# Patient Record
Sex: Female | Born: 1935 | Race: White | Hispanic: No | Marital: Married | Smoking: Former smoker
Health system: Southern US, Community
[De-identification: ages and names within clinical notes are randomized; demographics above are authoritative.]

## PROBLEM LIST (undated history)

## (undated) DIAGNOSIS — M4802 Spinal stenosis, cervical region: Secondary | ICD-10-CM

## (undated) DIAGNOSIS — E785 Hyperlipidemia, unspecified: Secondary | ICD-10-CM

## (undated) DIAGNOSIS — I5189 Other ill-defined heart diseases: Secondary | ICD-10-CM

## (undated) DIAGNOSIS — K219 Gastro-esophageal reflux disease without esophagitis: Secondary | ICD-10-CM

## (undated) DIAGNOSIS — G4700 Insomnia, unspecified: Secondary | ICD-10-CM

## (undated) DIAGNOSIS — D638 Anemia in other chronic diseases classified elsewhere: Secondary | ICD-10-CM

## (undated) DIAGNOSIS — R55 Syncope and collapse: Secondary | ICD-10-CM

## (undated) DIAGNOSIS — J302 Other seasonal allergic rhinitis: Secondary | ICD-10-CM

## (undated) DIAGNOSIS — I10 Essential (primary) hypertension: Secondary | ICD-10-CM

## (undated) DIAGNOSIS — R059 Cough, unspecified: Secondary | ICD-10-CM

## (undated) DIAGNOSIS — I484 Atypical atrial flutter: Secondary | ICD-10-CM

## (undated) DIAGNOSIS — M1990 Unspecified osteoarthritis, unspecified site: Secondary | ICD-10-CM

## (undated) DIAGNOSIS — J45909 Unspecified asthma, uncomplicated: Secondary | ICD-10-CM

## (undated) DIAGNOSIS — I4891 Unspecified atrial fibrillation: Secondary | ICD-10-CM

## (undated) DIAGNOSIS — R05 Cough: Secondary | ICD-10-CM

## (undated) DIAGNOSIS — I2510 Atherosclerotic heart disease of native coronary artery without angina pectoris: Secondary | ICD-10-CM

## (undated) DIAGNOSIS — I82409 Acute embolism and thrombosis of unspecified deep veins of unspecified lower extremity: Secondary | ICD-10-CM

## (undated) DIAGNOSIS — R0989 Other specified symptoms and signs involving the circulatory and respiratory systems: Secondary | ICD-10-CM

## (undated) DIAGNOSIS — E559 Vitamin D deficiency, unspecified: Secondary | ICD-10-CM

## (undated) HISTORY — DX: Vitamin D deficiency, unspecified: E55.9

## (undated) HISTORY — DX: Unspecified asthma, uncomplicated: J45.909

## (undated) HISTORY — PX: BASAL CELL CARCINOMA EXCISION: SHX1214

## (undated) HISTORY — DX: Unspecified atrial fibrillation: I48.91

## (undated) HISTORY — DX: Other ill-defined heart diseases: I51.89

## (undated) HISTORY — DX: Syncope and collapse: R55

## (undated) HISTORY — DX: Other seasonal allergic rhinitis: J30.2

## (undated) HISTORY — DX: Insomnia, unspecified: G47.00

## (undated) HISTORY — DX: Gastro-esophageal reflux disease without esophagitis: K21.9

## (undated) HISTORY — PX: VEIN LIGATION AND STRIPPING: SHX2653

## (undated) HISTORY — PX: FOOT SURGERY: SHX648

## (undated) HISTORY — DX: Atherosclerotic heart disease of native coronary artery without angina pectoris: I25.10

## (undated) HISTORY — DX: Cough, unspecified: R05.9

## (undated) HISTORY — DX: Spinal stenosis, cervical region: M48.02

## (undated) HISTORY — DX: Anemia in other chronic diseases classified elsewhere: D63.8

## (undated) HISTORY — DX: Cough: R05

## (undated) HISTORY — PX: JOINT REPLACEMENT: SHX530

## (undated) HISTORY — DX: Other specified symptoms and signs involving the circulatory and respiratory systems: R09.89

---

## 2013-05-11 DIAGNOSIS — B351 Tinea unguium: Secondary | ICD-10-CM | POA: Diagnosis not present

## 2013-05-11 DIAGNOSIS — M79609 Pain in unspecified limb: Secondary | ICD-10-CM | POA: Diagnosis not present

## 2013-05-12 DIAGNOSIS — D649 Anemia, unspecified: Secondary | ICD-10-CM | POA: Diagnosis not present

## 2013-07-13 DIAGNOSIS — M79609 Pain in unspecified limb: Secondary | ICD-10-CM | POA: Diagnosis not present

## 2013-07-13 DIAGNOSIS — B351 Tinea unguium: Secondary | ICD-10-CM | POA: Diagnosis not present

## 2013-07-21 DIAGNOSIS — Z7982 Long term (current) use of aspirin: Secondary | ICD-10-CM | POA: Diagnosis not present

## 2013-07-21 DIAGNOSIS — Z86010 Personal history of colonic polyps: Secondary | ICD-10-CM | POA: Diagnosis not present

## 2013-07-21 DIAGNOSIS — Z800 Family history of malignant neoplasm of digestive organs: Secondary | ICD-10-CM | POA: Diagnosis not present

## 2013-07-30 DIAGNOSIS — R7989 Other specified abnormal findings of blood chemistry: Secondary | ICD-10-CM | POA: Diagnosis not present

## 2013-07-30 DIAGNOSIS — D649 Anemia, unspecified: Secondary | ICD-10-CM | POA: Diagnosis not present

## 2013-08-04 DIAGNOSIS — G609 Hereditary and idiopathic neuropathy, unspecified: Secondary | ICD-10-CM | POA: Diagnosis not present

## 2013-08-04 DIAGNOSIS — E785 Hyperlipidemia, unspecified: Secondary | ICD-10-CM | POA: Diagnosis not present

## 2013-08-04 DIAGNOSIS — I10 Essential (primary) hypertension: Secondary | ICD-10-CM | POA: Diagnosis not present

## 2013-08-04 DIAGNOSIS — Z23 Encounter for immunization: Secondary | ICD-10-CM | POA: Diagnosis not present

## 2013-08-04 DIAGNOSIS — IMO0002 Reserved for concepts with insufficient information to code with codable children: Secondary | ICD-10-CM | POA: Diagnosis not present

## 2013-08-10 DIAGNOSIS — K130 Diseases of lips: Secondary | ICD-10-CM | POA: Diagnosis not present

## 2013-08-10 DIAGNOSIS — C44319 Basal cell carcinoma of skin of other parts of face: Secondary | ICD-10-CM | POA: Diagnosis not present

## 2013-08-10 DIAGNOSIS — D485 Neoplasm of uncertain behavior of skin: Secondary | ICD-10-CM | POA: Diagnosis not present

## 2013-08-10 DIAGNOSIS — D1801 Hemangioma of skin and subcutaneous tissue: Secondary | ICD-10-CM | POA: Diagnosis not present

## 2013-08-10 DIAGNOSIS — L821 Other seborrheic keratosis: Secondary | ICD-10-CM | POA: Diagnosis not present

## 2013-08-16 DIAGNOSIS — Z1211 Encounter for screening for malignant neoplasm of colon: Secondary | ICD-10-CM | POA: Diagnosis not present

## 2013-08-16 DIAGNOSIS — Z86010 Personal history of colonic polyps: Secondary | ICD-10-CM | POA: Diagnosis not present

## 2013-08-16 DIAGNOSIS — Z800 Family history of malignant neoplasm of digestive organs: Secondary | ICD-10-CM | POA: Diagnosis not present

## 2013-09-30 DIAGNOSIS — B351 Tinea unguium: Secondary | ICD-10-CM | POA: Diagnosis not present

## 2013-09-30 DIAGNOSIS — M79609 Pain in unspecified limb: Secondary | ICD-10-CM | POA: Diagnosis not present

## 2013-10-20 DIAGNOSIS — K551 Chronic vascular disorders of intestine: Secondary | ICD-10-CM | POA: Diagnosis not present

## 2013-10-20 DIAGNOSIS — I10 Essential (primary) hypertension: Secondary | ICD-10-CM | POA: Diagnosis not present

## 2013-10-20 DIAGNOSIS — I6529 Occlusion and stenosis of unspecified carotid artery: Secondary | ICD-10-CM | POA: Diagnosis not present

## 2013-10-20 DIAGNOSIS — G458 Other transient cerebral ischemic attacks and related syndromes: Secondary | ICD-10-CM | POA: Diagnosis not present

## 2013-10-28 DIAGNOSIS — H353190 Nonexudative age-related macular degeneration, unspecified eye, stage unspecified: Secondary | ICD-10-CM | POA: Diagnosis not present

## 2013-12-14 DIAGNOSIS — R262 Difficulty in walking, not elsewhere classified: Secondary | ICD-10-CM | POA: Diagnosis not present

## 2013-12-14 DIAGNOSIS — B351 Tinea unguium: Secondary | ICD-10-CM | POA: Diagnosis not present

## 2014-01-24 DIAGNOSIS — C44319 Basal cell carcinoma of skin of other parts of face: Secondary | ICD-10-CM | POA: Diagnosis not present

## 2014-02-08 DIAGNOSIS — I10 Essential (primary) hypertension: Secondary | ICD-10-CM | POA: Diagnosis not present

## 2014-02-08 DIAGNOSIS — N399 Disorder of urinary system, unspecified: Secondary | ICD-10-CM | POA: Diagnosis not present

## 2014-02-08 DIAGNOSIS — E559 Vitamin D deficiency, unspecified: Secondary | ICD-10-CM | POA: Diagnosis not present

## 2014-02-09 DIAGNOSIS — I6529 Occlusion and stenosis of unspecified carotid artery: Secondary | ICD-10-CM | POA: Diagnosis not present

## 2014-02-11 DIAGNOSIS — Z0001 Encounter for general adult medical examination with abnormal findings: Secondary | ICD-10-CM | POA: Diagnosis not present

## 2014-02-11 DIAGNOSIS — Z13820 Encounter for screening for osteoporosis: Secondary | ICD-10-CM | POA: Diagnosis not present

## 2014-02-11 DIAGNOSIS — I10 Essential (primary) hypertension: Secondary | ICD-10-CM | POA: Diagnosis not present

## 2014-02-11 DIAGNOSIS — D538 Other specified nutritional anemias: Secondary | ICD-10-CM | POA: Diagnosis not present

## 2014-02-11 DIAGNOSIS — Z1231 Encounter for screening mammogram for malignant neoplasm of breast: Secondary | ICD-10-CM | POA: Diagnosis not present

## 2014-02-11 DIAGNOSIS — J4520 Mild intermittent asthma, uncomplicated: Secondary | ICD-10-CM | POA: Diagnosis not present

## 2014-02-11 DIAGNOSIS — E780 Pure hypercholesterolemia: Secondary | ICD-10-CM | POA: Diagnosis not present

## 2014-02-11 DIAGNOSIS — J209 Acute bronchitis, unspecified: Secondary | ICD-10-CM | POA: Diagnosis not present

## 2014-02-15 DIAGNOSIS — B351 Tinea unguium: Secondary | ICD-10-CM | POA: Diagnosis not present

## 2014-02-15 DIAGNOSIS — R262 Difficulty in walking, not elsewhere classified: Secondary | ICD-10-CM | POA: Diagnosis not present

## 2014-03-03 DIAGNOSIS — R05 Cough: Secondary | ICD-10-CM | POA: Diagnosis not present

## 2014-03-03 DIAGNOSIS — R938 Abnormal findings on diagnostic imaging of other specified body structures: Secondary | ICD-10-CM | POA: Diagnosis not present

## 2014-03-15 DIAGNOSIS — J3089 Other allergic rhinitis: Secondary | ICD-10-CM | POA: Diagnosis not present

## 2014-03-15 DIAGNOSIS — R05 Cough: Secondary | ICD-10-CM | POA: Diagnosis not present

## 2014-03-30 DIAGNOSIS — Z1231 Encounter for screening mammogram for malignant neoplasm of breast: Secondary | ICD-10-CM | POA: Diagnosis not present

## 2014-03-30 DIAGNOSIS — M899 Disorder of bone, unspecified: Secondary | ICD-10-CM | POA: Diagnosis not present

## 2014-04-04 DIAGNOSIS — Z23 Encounter for immunization: Secondary | ICD-10-CM | POA: Diagnosis not present

## 2014-04-11 DIAGNOSIS — H43813 Vitreous degeneration, bilateral: Secondary | ICD-10-CM | POA: Diagnosis not present

## 2014-04-11 DIAGNOSIS — H35361 Drusen (degenerative) of macula, right eye: Secondary | ICD-10-CM | POA: Diagnosis not present

## 2014-04-11 DIAGNOSIS — H04123 Dry eye syndrome of bilateral lacrimal glands: Secondary | ICD-10-CM | POA: Diagnosis not present

## 2014-04-11 DIAGNOSIS — H2513 Age-related nuclear cataract, bilateral: Secondary | ICD-10-CM | POA: Diagnosis not present

## 2014-04-19 DIAGNOSIS — B351 Tinea unguium: Secondary | ICD-10-CM | POA: Diagnosis not present

## 2014-04-19 DIAGNOSIS — R262 Difficulty in walking, not elsewhere classified: Secondary | ICD-10-CM | POA: Diagnosis not present

## 2014-06-21 DIAGNOSIS — R262 Difficulty in walking, not elsewhere classified: Secondary | ICD-10-CM | POA: Diagnosis not present

## 2014-06-21 DIAGNOSIS — B351 Tinea unguium: Secondary | ICD-10-CM | POA: Diagnosis not present

## 2014-06-29 DIAGNOSIS — J45998 Other asthma: Secondary | ICD-10-CM | POA: Diagnosis not present

## 2014-08-01 DIAGNOSIS — D1801 Hemangioma of skin and subcutaneous tissue: Secondary | ICD-10-CM | POA: Diagnosis not present

## 2014-08-01 DIAGNOSIS — L905 Scar conditions and fibrosis of skin: Secondary | ICD-10-CM | POA: Diagnosis not present

## 2014-08-01 DIAGNOSIS — L821 Other seborrheic keratosis: Secondary | ICD-10-CM | POA: Diagnosis not present

## 2014-08-19 DIAGNOSIS — Z6822 Body mass index (BMI) 22.0-22.9, adult: Secondary | ICD-10-CM | POA: Diagnosis not present

## 2014-08-19 DIAGNOSIS — F418 Other specified anxiety disorders: Secondary | ICD-10-CM | POA: Diagnosis not present

## 2014-08-23 DIAGNOSIS — B351 Tinea unguium: Secondary | ICD-10-CM | POA: Diagnosis not present

## 2014-08-23 DIAGNOSIS — R262 Difficulty in walking, not elsewhere classified: Secondary | ICD-10-CM | POA: Diagnosis not present

## 2015-02-13 DIAGNOSIS — K635 Polyp of colon: Secondary | ICD-10-CM | POA: Insufficient documentation

## 2015-02-13 DIAGNOSIS — E559 Vitamin D deficiency, unspecified: Secondary | ICD-10-CM | POA: Diagnosis not present

## 2015-02-13 DIAGNOSIS — Z23 Encounter for immunization: Secondary | ICD-10-CM | POA: Diagnosis not present

## 2015-02-13 DIAGNOSIS — Z1389 Encounter for screening for other disorder: Secondary | ICD-10-CM | POA: Diagnosis not present

## 2015-02-13 DIAGNOSIS — E785 Hyperlipidemia, unspecified: Secondary | ICD-10-CM | POA: Insufficient documentation

## 2015-02-13 DIAGNOSIS — J45909 Unspecified asthma, uncomplicated: Secondary | ICD-10-CM | POA: Diagnosis not present

## 2015-02-13 DIAGNOSIS — G4700 Insomnia, unspecified: Secondary | ICD-10-CM | POA: Insufficient documentation

## 2015-02-13 DIAGNOSIS — I10 Essential (primary) hypertension: Secondary | ICD-10-CM | POA: Diagnosis not present

## 2015-02-13 DIAGNOSIS — G5601 Carpal tunnel syndrome, right upper limb: Secondary | ICD-10-CM | POA: Diagnosis not present

## 2015-04-07 DIAGNOSIS — R8299 Other abnormal findings in urine: Secondary | ICD-10-CM | POA: Diagnosis not present

## 2015-04-07 DIAGNOSIS — E784 Other hyperlipidemia: Secondary | ICD-10-CM | POA: Diagnosis not present

## 2015-04-07 DIAGNOSIS — I10 Essential (primary) hypertension: Secondary | ICD-10-CM | POA: Diagnosis not present

## 2015-04-07 DIAGNOSIS — E559 Vitamin D deficiency, unspecified: Secondary | ICD-10-CM | POA: Diagnosis not present

## 2015-04-12 DIAGNOSIS — Z0000 Encounter for general adult medical examination without abnormal findings: Secondary | ICD-10-CM | POA: Diagnosis not present

## 2015-04-12 DIAGNOSIS — D649 Anemia, unspecified: Secondary | ICD-10-CM | POA: Diagnosis not present

## 2015-04-20 DIAGNOSIS — I6529 Occlusion and stenosis of unspecified carotid artery: Secondary | ICD-10-CM | POA: Insufficient documentation

## 2015-04-20 DIAGNOSIS — K219 Gastro-esophageal reflux disease without esophagitis: Secondary | ICD-10-CM | POA: Diagnosis not present

## 2015-04-20 DIAGNOSIS — I2510 Atherosclerotic heart disease of native coronary artery without angina pectoris: Secondary | ICD-10-CM | POA: Insufficient documentation

## 2015-04-20 DIAGNOSIS — H3530 Unspecified macular degeneration: Secondary | ICD-10-CM | POA: Insufficient documentation

## 2015-04-20 DIAGNOSIS — I519 Heart disease, unspecified: Secondary | ICD-10-CM | POA: Diagnosis not present

## 2015-04-20 DIAGNOSIS — G4709 Other insomnia: Secondary | ICD-10-CM | POA: Diagnosis not present

## 2015-04-20 DIAGNOSIS — D649 Anemia, unspecified: Secondary | ICD-10-CM | POA: Insufficient documentation

## 2015-04-20 DIAGNOSIS — I10 Essential (primary) hypertension: Secondary | ICD-10-CM | POA: Diagnosis not present

## 2015-04-20 DIAGNOSIS — Z6823 Body mass index (BMI) 23.0-23.9, adult: Secondary | ICD-10-CM | POA: Diagnosis not present

## 2015-04-20 DIAGNOSIS — Z0000 Encounter for general adult medical examination without abnormal findings: Secondary | ICD-10-CM | POA: Diagnosis not present

## 2015-04-20 DIAGNOSIS — D638 Anemia in other chronic diseases classified elsewhere: Secondary | ICD-10-CM | POA: Diagnosis not present

## 2015-04-20 DIAGNOSIS — M4802 Spinal stenosis, cervical region: Secondary | ICD-10-CM | POA: Diagnosis not present

## 2015-04-20 DIAGNOSIS — K635 Polyp of colon: Secondary | ICD-10-CM | POA: Diagnosis not present

## 2015-04-20 DIAGNOSIS — J309 Allergic rhinitis, unspecified: Secondary | ICD-10-CM | POA: Insufficient documentation

## 2015-04-20 DIAGNOSIS — E784 Other hyperlipidemia: Secondary | ICD-10-CM | POA: Diagnosis not present

## 2015-04-24 ENCOUNTER — Other Ambulatory Visit: Payer: Self-pay | Admitting: Internal Medicine

## 2015-04-24 DIAGNOSIS — Z1231 Encounter for screening mammogram for malignant neoplasm of breast: Secondary | ICD-10-CM

## 2015-05-17 DIAGNOSIS — I771 Stricture of artery: Secondary | ICD-10-CM | POA: Diagnosis not present

## 2015-05-17 DIAGNOSIS — I739 Peripheral vascular disease, unspecified: Secondary | ICD-10-CM | POA: Diagnosis not present

## 2015-05-17 DIAGNOSIS — R079 Chest pain, unspecified: Secondary | ICD-10-CM | POA: Diagnosis not present

## 2015-05-17 DIAGNOSIS — I6523 Occlusion and stenosis of bilateral carotid arteries: Secondary | ICD-10-CM | POA: Diagnosis not present

## 2015-05-22 DIAGNOSIS — R0789 Other chest pain: Secondary | ICD-10-CM | POA: Diagnosis not present

## 2015-05-24 DIAGNOSIS — I739 Peripheral vascular disease, unspecified: Secondary | ICD-10-CM | POA: Diagnosis not present

## 2015-05-24 DIAGNOSIS — R0989 Other specified symptoms and signs involving the circulatory and respiratory systems: Secondary | ICD-10-CM | POA: Diagnosis not present

## 2015-05-30 DIAGNOSIS — I6523 Occlusion and stenosis of bilateral carotid arteries: Secondary | ICD-10-CM | POA: Diagnosis not present

## 2015-05-30 DIAGNOSIS — I080 Rheumatic disorders of both mitral and aortic valves: Secondary | ICD-10-CM | POA: Diagnosis not present

## 2015-05-30 DIAGNOSIS — I771 Stricture of artery: Secondary | ICD-10-CM | POA: Diagnosis not present

## 2015-06-05 ENCOUNTER — Ambulatory Visit
Admission: RE | Admit: 2015-06-05 | Discharge: 2015-06-05 | Disposition: A | Discharge status: Home / Self Care | Payer: Medicare HMO | Source: Ambulatory Visit | Attending: Internal Medicine | Admitting: Internal Medicine

## 2015-06-05 ENCOUNTER — Ambulatory Visit: Payer: Self-pay

## 2015-06-05 DIAGNOSIS — Z1231 Encounter for screening mammogram for malignant neoplasm of breast: Secondary | ICD-10-CM

## 2015-06-21 DIAGNOSIS — I771 Stricture of artery: Secondary | ICD-10-CM | POA: Diagnosis not present

## 2015-06-28 DIAGNOSIS — R079 Chest pain, unspecified: Secondary | ICD-10-CM | POA: Diagnosis not present

## 2015-06-28 DIAGNOSIS — I771 Stricture of artery: Secondary | ICD-10-CM | POA: Diagnosis not present

## 2015-06-28 DIAGNOSIS — I739 Peripheral vascular disease, unspecified: Secondary | ICD-10-CM | POA: Diagnosis not present

## 2015-06-28 DIAGNOSIS — I080 Rheumatic disorders of both mitral and aortic valves: Secondary | ICD-10-CM | POA: Diagnosis not present

## 2015-08-23 DIAGNOSIS — H5211 Myopia, right eye: Secondary | ICD-10-CM | POA: Diagnosis not present

## 2015-08-23 DIAGNOSIS — H353131 Nonexudative age-related macular degeneration, bilateral, early dry stage: Secondary | ICD-10-CM | POA: Diagnosis not present

## 2015-08-23 DIAGNOSIS — H52222 Regular astigmatism, left eye: Secondary | ICD-10-CM | POA: Diagnosis not present

## 2015-08-23 DIAGNOSIS — H25813 Combined forms of age-related cataract, bilateral: Secondary | ICD-10-CM | POA: Diagnosis not present

## 2015-08-23 DIAGNOSIS — H04123 Dry eye syndrome of bilateral lacrimal glands: Secondary | ICD-10-CM | POA: Diagnosis not present

## 2015-08-23 DIAGNOSIS — H524 Presbyopia: Secondary | ICD-10-CM | POA: Diagnosis not present

## 2015-10-30 DIAGNOSIS — I10 Essential (primary) hypertension: Secondary | ICD-10-CM | POA: Diagnosis not present

## 2015-10-30 DIAGNOSIS — Z1389 Encounter for screening for other disorder: Secondary | ICD-10-CM | POA: Diagnosis not present

## 2015-10-30 DIAGNOSIS — I519 Heart disease, unspecified: Secondary | ICD-10-CM | POA: Diagnosis not present

## 2015-10-30 DIAGNOSIS — I2510 Atherosclerotic heart disease of native coronary artery without angina pectoris: Secondary | ICD-10-CM | POA: Diagnosis not present

## 2015-10-30 DIAGNOSIS — I6529 Occlusion and stenosis of unspecified carotid artery: Secondary | ICD-10-CM | POA: Diagnosis not present

## 2015-10-30 DIAGNOSIS — E784 Other hyperlipidemia: Secondary | ICD-10-CM | POA: Diagnosis not present

## 2015-10-30 DIAGNOSIS — Z6823 Body mass index (BMI) 23.0-23.9, adult: Secondary | ICD-10-CM | POA: Diagnosis not present

## 2015-10-30 DIAGNOSIS — G5601 Carpal tunnel syndrome, right upper limb: Secondary | ICD-10-CM | POA: Diagnosis not present

## 2015-11-10 DIAGNOSIS — M47817 Spondylosis without myelopathy or radiculopathy, lumbosacral region: Secondary | ICD-10-CM | POA: Diagnosis not present

## 2015-11-10 DIAGNOSIS — M5136 Other intervertebral disc degeneration, lumbar region: Secondary | ICD-10-CM | POA: Diagnosis not present

## 2015-11-10 DIAGNOSIS — Z6823 Body mass index (BMI) 23.0-23.9, adult: Secondary | ICD-10-CM | POA: Diagnosis not present

## 2015-11-10 DIAGNOSIS — M47816 Spondylosis without myelopathy or radiculopathy, lumbar region: Secondary | ICD-10-CM | POA: Diagnosis not present

## 2015-11-10 DIAGNOSIS — M545 Low back pain: Secondary | ICD-10-CM | POA: Diagnosis not present

## 2015-11-10 DIAGNOSIS — M5137 Other intervertebral disc degeneration, lumbosacral region: Secondary | ICD-10-CM | POA: Diagnosis not present

## 2015-11-10 DIAGNOSIS — M25552 Pain in left hip: Secondary | ICD-10-CM | POA: Diagnosis not present

## 2015-11-13 DIAGNOSIS — Z1283 Encounter for screening for malignant neoplasm of skin: Secondary | ICD-10-CM | POA: Diagnosis not present

## 2015-11-13 DIAGNOSIS — L908 Other atrophic disorders of skin: Secondary | ICD-10-CM | POA: Diagnosis not present

## 2015-11-13 DIAGNOSIS — L821 Other seborrheic keratosis: Secondary | ICD-10-CM | POA: Diagnosis not present

## 2015-11-21 DIAGNOSIS — I714 Abdominal aortic aneurysm, without rupture: Secondary | ICD-10-CM | POA: Diagnosis not present

## 2015-12-12 DIAGNOSIS — M79641 Pain in right hand: Secondary | ICD-10-CM | POA: Diagnosis not present

## 2015-12-12 DIAGNOSIS — G5601 Carpal tunnel syndrome, right upper limb: Secondary | ICD-10-CM | POA: Diagnosis not present

## 2016-01-20 DIAGNOSIS — M79641 Pain in right hand: Secondary | ICD-10-CM | POA: Diagnosis not present

## 2016-02-07 DIAGNOSIS — R69 Illness, unspecified: Secondary | ICD-10-CM | POA: Diagnosis not present

## 2016-02-08 DIAGNOSIS — G5601 Carpal tunnel syndrome, right upper limb: Secondary | ICD-10-CM | POA: Diagnosis not present

## 2016-02-08 DIAGNOSIS — M79641 Pain in right hand: Secondary | ICD-10-CM | POA: Diagnosis not present

## 2016-03-11 DIAGNOSIS — M47817 Spondylosis without myelopathy or radiculopathy, lumbosacral region: Secondary | ICD-10-CM | POA: Diagnosis not present

## 2016-03-11 DIAGNOSIS — M9902 Segmental and somatic dysfunction of thoracic region: Secondary | ICD-10-CM | POA: Diagnosis not present

## 2016-03-11 DIAGNOSIS — S29012A Strain of muscle and tendon of back wall of thorax, initial encounter: Secondary | ICD-10-CM | POA: Diagnosis not present

## 2016-03-11 DIAGNOSIS — M5137 Other intervertebral disc degeneration, lumbosacral region: Secondary | ICD-10-CM | POA: Diagnosis not present

## 2016-03-11 DIAGNOSIS — M9903 Segmental and somatic dysfunction of lumbar region: Secondary | ICD-10-CM | POA: Diagnosis not present

## 2016-03-11 DIAGNOSIS — M9905 Segmental and somatic dysfunction of pelvic region: Secondary | ICD-10-CM | POA: Diagnosis not present

## 2016-03-18 DIAGNOSIS — M5137 Other intervertebral disc degeneration, lumbosacral region: Secondary | ICD-10-CM | POA: Diagnosis not present

## 2016-03-18 DIAGNOSIS — S29012A Strain of muscle and tendon of back wall of thorax, initial encounter: Secondary | ICD-10-CM | POA: Diagnosis not present

## 2016-03-18 DIAGNOSIS — M9905 Segmental and somatic dysfunction of pelvic region: Secondary | ICD-10-CM | POA: Diagnosis not present

## 2016-03-18 DIAGNOSIS — M9902 Segmental and somatic dysfunction of thoracic region: Secondary | ICD-10-CM | POA: Diagnosis not present

## 2016-03-18 DIAGNOSIS — M9903 Segmental and somatic dysfunction of lumbar region: Secondary | ICD-10-CM | POA: Diagnosis not present

## 2016-03-18 DIAGNOSIS — M47817 Spondylosis without myelopathy or radiculopathy, lumbosacral region: Secondary | ICD-10-CM | POA: Diagnosis not present

## 2016-03-25 DIAGNOSIS — M9905 Segmental and somatic dysfunction of pelvic region: Secondary | ICD-10-CM | POA: Diagnosis not present

## 2016-03-25 DIAGNOSIS — M9902 Segmental and somatic dysfunction of thoracic region: Secondary | ICD-10-CM | POA: Diagnosis not present

## 2016-03-25 DIAGNOSIS — M47817 Spondylosis without myelopathy or radiculopathy, lumbosacral region: Secondary | ICD-10-CM | POA: Diagnosis not present

## 2016-03-25 DIAGNOSIS — M5137 Other intervertebral disc degeneration, lumbosacral region: Secondary | ICD-10-CM | POA: Diagnosis not present

## 2016-03-25 DIAGNOSIS — S29012A Strain of muscle and tendon of back wall of thorax, initial encounter: Secondary | ICD-10-CM | POA: Diagnosis not present

## 2016-03-25 DIAGNOSIS — M9903 Segmental and somatic dysfunction of lumbar region: Secondary | ICD-10-CM | POA: Diagnosis not present

## 2016-04-01 DIAGNOSIS — J45909 Unspecified asthma, uncomplicated: Secondary | ICD-10-CM | POA: Diagnosis not present

## 2016-04-01 DIAGNOSIS — S29012A Strain of muscle and tendon of back wall of thorax, initial encounter: Secondary | ICD-10-CM | POA: Diagnosis not present

## 2016-04-01 DIAGNOSIS — Z6823 Body mass index (BMI) 23.0-23.9, adult: Secondary | ICD-10-CM | POA: Diagnosis not present

## 2016-04-01 DIAGNOSIS — R05 Cough: Secondary | ICD-10-CM | POA: Diagnosis not present

## 2016-04-01 DIAGNOSIS — M9902 Segmental and somatic dysfunction of thoracic region: Secondary | ICD-10-CM | POA: Diagnosis not present

## 2016-04-01 DIAGNOSIS — M47817 Spondylosis without myelopathy or radiculopathy, lumbosacral region: Secondary | ICD-10-CM | POA: Diagnosis not present

## 2016-04-01 DIAGNOSIS — J309 Allergic rhinitis, unspecified: Secondary | ICD-10-CM | POA: Diagnosis not present

## 2016-04-01 DIAGNOSIS — M5137 Other intervertebral disc degeneration, lumbosacral region: Secondary | ICD-10-CM | POA: Diagnosis not present

## 2016-04-01 DIAGNOSIS — M9905 Segmental and somatic dysfunction of pelvic region: Secondary | ICD-10-CM | POA: Diagnosis not present

## 2016-04-01 DIAGNOSIS — M9903 Segmental and somatic dysfunction of lumbar region: Secondary | ICD-10-CM | POA: Diagnosis not present

## 2016-04-15 DIAGNOSIS — M9905 Segmental and somatic dysfunction of pelvic region: Secondary | ICD-10-CM | POA: Diagnosis not present

## 2016-04-15 DIAGNOSIS — M47817 Spondylosis without myelopathy or radiculopathy, lumbosacral region: Secondary | ICD-10-CM | POA: Diagnosis not present

## 2016-04-15 DIAGNOSIS — M5137 Other intervertebral disc degeneration, lumbosacral region: Secondary | ICD-10-CM | POA: Diagnosis not present

## 2016-04-15 DIAGNOSIS — S29012A Strain of muscle and tendon of back wall of thorax, initial encounter: Secondary | ICD-10-CM | POA: Diagnosis not present

## 2016-04-15 DIAGNOSIS — M9903 Segmental and somatic dysfunction of lumbar region: Secondary | ICD-10-CM | POA: Diagnosis not present

## 2016-04-15 DIAGNOSIS — M9902 Segmental and somatic dysfunction of thoracic region: Secondary | ICD-10-CM | POA: Diagnosis not present

## 2016-04-18 DIAGNOSIS — R8299 Other abnormal findings in urine: Secondary | ICD-10-CM | POA: Diagnosis not present

## 2016-04-18 DIAGNOSIS — I10 Essential (primary) hypertension: Secondary | ICD-10-CM | POA: Diagnosis not present

## 2016-04-18 DIAGNOSIS — E559 Vitamin D deficiency, unspecified: Secondary | ICD-10-CM | POA: Diagnosis not present

## 2016-04-18 DIAGNOSIS — E785 Hyperlipidemia, unspecified: Secondary | ICD-10-CM | POA: Diagnosis not present

## 2016-04-25 DIAGNOSIS — I10 Essential (primary) hypertension: Secondary | ICD-10-CM | POA: Diagnosis not present

## 2016-04-25 DIAGNOSIS — K219 Gastro-esophageal reflux disease without esophagitis: Secondary | ICD-10-CM | POA: Diagnosis not present

## 2016-04-25 DIAGNOSIS — H3530 Unspecified macular degeneration: Secondary | ICD-10-CM | POA: Diagnosis not present

## 2016-04-25 DIAGNOSIS — E784 Other hyperlipidemia: Secondary | ICD-10-CM | POA: Diagnosis not present

## 2016-04-25 DIAGNOSIS — J45909 Unspecified asthma, uncomplicated: Secondary | ICD-10-CM | POA: Diagnosis not present

## 2016-04-25 DIAGNOSIS — J309 Allergic rhinitis, unspecified: Secondary | ICD-10-CM | POA: Diagnosis not present

## 2016-04-25 DIAGNOSIS — I519 Heart disease, unspecified: Secondary | ICD-10-CM | POA: Diagnosis not present

## 2016-04-25 DIAGNOSIS — Z0000 Encounter for general adult medical examination without abnormal findings: Secondary | ICD-10-CM | POA: Diagnosis not present

## 2016-04-25 DIAGNOSIS — I2510 Atherosclerotic heart disease of native coronary artery without angina pectoris: Secondary | ICD-10-CM | POA: Diagnosis not present

## 2016-04-25 DIAGNOSIS — I6529 Occlusion and stenosis of unspecified carotid artery: Secondary | ICD-10-CM | POA: Diagnosis not present

## 2016-04-25 DIAGNOSIS — M19079 Primary osteoarthritis, unspecified ankle and foot: Secondary | ICD-10-CM | POA: Insufficient documentation

## 2016-05-06 DIAGNOSIS — I4891 Unspecified atrial fibrillation: Secondary | ICD-10-CM

## 2016-05-06 HISTORY — DX: Unspecified atrial fibrillation: I48.91

## 2016-05-07 DIAGNOSIS — S29012A Strain of muscle and tendon of back wall of thorax, initial encounter: Secondary | ICD-10-CM | POA: Diagnosis not present

## 2016-05-07 DIAGNOSIS — M5137 Other intervertebral disc degeneration, lumbosacral region: Secondary | ICD-10-CM | POA: Diagnosis not present

## 2016-05-07 DIAGNOSIS — M9905 Segmental and somatic dysfunction of pelvic region: Secondary | ICD-10-CM | POA: Diagnosis not present

## 2016-05-07 DIAGNOSIS — M9903 Segmental and somatic dysfunction of lumbar region: Secondary | ICD-10-CM | POA: Diagnosis not present

## 2016-05-07 DIAGNOSIS — M9902 Segmental and somatic dysfunction of thoracic region: Secondary | ICD-10-CM | POA: Diagnosis not present

## 2016-05-07 DIAGNOSIS — M47817 Spondylosis without myelopathy or radiculopathy, lumbosacral region: Secondary | ICD-10-CM | POA: Diagnosis not present

## 2016-05-10 ENCOUNTER — Other Ambulatory Visit: Payer: Self-pay | Admitting: Internal Medicine

## 2016-05-10 DIAGNOSIS — Z1231 Encounter for screening mammogram for malignant neoplasm of breast: Secondary | ICD-10-CM

## 2016-05-28 DIAGNOSIS — M47817 Spondylosis without myelopathy or radiculopathy, lumbosacral region: Secondary | ICD-10-CM | POA: Diagnosis not present

## 2016-05-28 DIAGNOSIS — M9902 Segmental and somatic dysfunction of thoracic region: Secondary | ICD-10-CM | POA: Diagnosis not present

## 2016-05-28 DIAGNOSIS — M9903 Segmental and somatic dysfunction of lumbar region: Secondary | ICD-10-CM | POA: Diagnosis not present

## 2016-05-28 DIAGNOSIS — S29012A Strain of muscle and tendon of back wall of thorax, initial encounter: Secondary | ICD-10-CM | POA: Diagnosis not present

## 2016-05-28 DIAGNOSIS — M5137 Other intervertebral disc degeneration, lumbosacral region: Secondary | ICD-10-CM | POA: Diagnosis not present

## 2016-05-28 DIAGNOSIS — M9905 Segmental and somatic dysfunction of pelvic region: Secondary | ICD-10-CM | POA: Diagnosis not present

## 2016-06-03 DIAGNOSIS — Z20828 Contact with and (suspected) exposure to other viral communicable diseases: Secondary | ICD-10-CM | POA: Diagnosis not present

## 2016-06-03 DIAGNOSIS — R5381 Other malaise: Secondary | ICD-10-CM | POA: Diagnosis not present

## 2016-06-03 DIAGNOSIS — J209 Acute bronchitis, unspecified: Secondary | ICD-10-CM | POA: Diagnosis not present

## 2016-06-03 DIAGNOSIS — R05 Cough: Secondary | ICD-10-CM | POA: Diagnosis not present

## 2016-06-06 ENCOUNTER — Ambulatory Visit: Payer: Medicare HMO

## 2016-06-20 DIAGNOSIS — H25813 Combined forms of age-related cataract, bilateral: Secondary | ICD-10-CM | POA: Diagnosis not present

## 2016-06-20 DIAGNOSIS — H04123 Dry eye syndrome of bilateral lacrimal glands: Secondary | ICD-10-CM | POA: Diagnosis not present

## 2016-06-20 DIAGNOSIS — H353131 Nonexudative age-related macular degeneration, bilateral, early dry stage: Secondary | ICD-10-CM | POA: Diagnosis not present

## 2016-06-24 DIAGNOSIS — M205X2 Other deformities of toe(s) (acquired), left foot: Secondary | ICD-10-CM | POA: Diagnosis not present

## 2016-06-24 DIAGNOSIS — M205X1 Other deformities of toe(s) (acquired), right foot: Secondary | ICD-10-CM | POA: Diagnosis not present

## 2016-06-25 DIAGNOSIS — M9903 Segmental and somatic dysfunction of lumbar region: Secondary | ICD-10-CM | POA: Diagnosis not present

## 2016-06-25 DIAGNOSIS — M47817 Spondylosis without myelopathy or radiculopathy, lumbosacral region: Secondary | ICD-10-CM | POA: Diagnosis not present

## 2016-06-25 DIAGNOSIS — M9905 Segmental and somatic dysfunction of pelvic region: Secondary | ICD-10-CM | POA: Diagnosis not present

## 2016-06-25 DIAGNOSIS — S29012A Strain of muscle and tendon of back wall of thorax, initial encounter: Secondary | ICD-10-CM | POA: Diagnosis not present

## 2016-06-25 DIAGNOSIS — M9902 Segmental and somatic dysfunction of thoracic region: Secondary | ICD-10-CM | POA: Diagnosis not present

## 2016-06-25 DIAGNOSIS — M5137 Other intervertebral disc degeneration, lumbosacral region: Secondary | ICD-10-CM | POA: Diagnosis not present

## 2016-06-26 ENCOUNTER — Other Ambulatory Visit: Payer: Self-pay | Admitting: Orthopedic Surgery

## 2016-07-03 ENCOUNTER — Ambulatory Visit
Admission: RE | Admit: 2016-07-03 | Discharge: 2016-07-03 | Disposition: A | Discharge status: Home / Self Care | Payer: Medicare HMO | Source: Ambulatory Visit | Attending: Internal Medicine | Admitting: Internal Medicine

## 2016-07-03 ENCOUNTER — Encounter: Payer: Self-pay | Admitting: Radiology

## 2016-07-03 DIAGNOSIS — Z1231 Encounter for screening mammogram for malignant neoplasm of breast: Secondary | ICD-10-CM

## 2016-07-18 DIAGNOSIS — I771 Stricture of artery: Secondary | ICD-10-CM | POA: Diagnosis not present

## 2016-07-22 DIAGNOSIS — S29012A Strain of muscle and tendon of back wall of thorax, initial encounter: Secondary | ICD-10-CM | POA: Diagnosis not present

## 2016-07-22 DIAGNOSIS — M47817 Spondylosis without myelopathy or radiculopathy, lumbosacral region: Secondary | ICD-10-CM | POA: Diagnosis not present

## 2016-07-22 DIAGNOSIS — M9903 Segmental and somatic dysfunction of lumbar region: Secondary | ICD-10-CM | POA: Diagnosis not present

## 2016-07-22 DIAGNOSIS — M9905 Segmental and somatic dysfunction of pelvic region: Secondary | ICD-10-CM | POA: Diagnosis not present

## 2016-07-22 DIAGNOSIS — M9902 Segmental and somatic dysfunction of thoracic region: Secondary | ICD-10-CM | POA: Diagnosis not present

## 2016-07-22 DIAGNOSIS — M5137 Other intervertebral disc degeneration, lumbosacral region: Secondary | ICD-10-CM | POA: Diagnosis not present

## 2016-07-23 ENCOUNTER — Encounter (HOSPITAL_BASED_OUTPATIENT_CLINIC_OR_DEPARTMENT_OTHER): Payer: Self-pay | Admitting: *Deleted

## 2016-07-24 ENCOUNTER — Encounter (HOSPITAL_BASED_OUTPATIENT_CLINIC_OR_DEPARTMENT_OTHER)
Admission: RE | Admit: 2016-07-24 | Discharge: 2016-07-24 | Disposition: A | Discharge status: Home / Self Care | Payer: Medicare HMO | Source: Ambulatory Visit | Attending: Orthopedic Surgery | Admitting: Orthopedic Surgery

## 2016-07-24 ENCOUNTER — Other Ambulatory Visit: Payer: Self-pay | Admitting: Orthopedic Surgery

## 2016-07-24 DIAGNOSIS — D509 Iron deficiency anemia, unspecified: Secondary | ICD-10-CM | POA: Diagnosis not present

## 2016-07-24 DIAGNOSIS — Z79899 Other long term (current) drug therapy: Secondary | ICD-10-CM | POA: Diagnosis not present

## 2016-07-24 DIAGNOSIS — M205X2 Other deformities of toe(s) (acquired), left foot: Secondary | ICD-10-CM | POA: Diagnosis not present

## 2016-07-24 DIAGNOSIS — I4891 Unspecified atrial fibrillation: Secondary | ICD-10-CM | POA: Diagnosis not present

## 2016-07-24 DIAGNOSIS — M205X1 Other deformities of toe(s) (acquired), right foot: Secondary | ICD-10-CM | POA: Diagnosis not present

## 2016-07-24 DIAGNOSIS — Z7982 Long term (current) use of aspirin: Secondary | ICD-10-CM | POA: Diagnosis not present

## 2016-07-24 DIAGNOSIS — Z86718 Personal history of other venous thrombosis and embolism: Secondary | ICD-10-CM | POA: Diagnosis not present

## 2016-07-24 DIAGNOSIS — E785 Hyperlipidemia, unspecified: Secondary | ICD-10-CM | POA: Diagnosis not present

## 2016-07-24 DIAGNOSIS — Z9889 Other specified postprocedural states: Secondary | ICD-10-CM | POA: Diagnosis not present

## 2016-07-24 DIAGNOSIS — I10 Essential (primary) hypertension: Secondary | ICD-10-CM | POA: Diagnosis not present

## 2016-07-24 DIAGNOSIS — M1990 Unspecified osteoarthritis, unspecified site: Secondary | ICD-10-CM | POA: Diagnosis not present

## 2016-07-24 NOTE — Progress Notes (Signed)
Pt EKG shows new onset AFIB, please see chart. No HX per pt.  Dr Tobias Alexander spoke with pt. About AFIB and concerns.  Cardiologist called, Dr Woody Seller and appt. Made for today at 3pm in his office with his associate.  Pt otherwise asymptomatic and verbalizes understanding. Dr Hewitts office called to alert of new onset AFIB and states they will call pt. To reschedule surgery for later date.  Pt verbalized understanding of this too.  NAD. Spouse at side.

## 2016-07-25 ENCOUNTER — Encounter (HOSPITAL_BASED_OUTPATIENT_CLINIC_OR_DEPARTMENT_OTHER)
Admission: RE | Disposition: A | Discharge status: Home / Self Care | Payer: Self-pay | Source: Ambulatory Visit | Attending: Orthopedic Surgery

## 2016-07-25 ENCOUNTER — Encounter (HOSPITAL_BASED_OUTPATIENT_CLINIC_OR_DEPARTMENT_OTHER): Payer: Self-pay | Admitting: *Deleted

## 2016-07-25 ENCOUNTER — Ambulatory Visit (HOSPITAL_BASED_OUTPATIENT_CLINIC_OR_DEPARTMENT_OTHER): Payer: Medicare HMO | Admitting: Anesthesiology

## 2016-07-25 ENCOUNTER — Ambulatory Visit (HOSPITAL_BASED_OUTPATIENT_CLINIC_OR_DEPARTMENT_OTHER)
Admission: RE | Admit: 2016-07-25 | Discharge: 2016-07-25 | Disposition: A | Discharge status: Home / Self Care | Payer: Medicare HMO | Source: Ambulatory Visit | Attending: Orthopedic Surgery | Admitting: Orthopedic Surgery

## 2016-07-25 DIAGNOSIS — Z9889 Other specified postprocedural states: Secondary | ICD-10-CM | POA: Diagnosis not present

## 2016-07-25 DIAGNOSIS — E785 Hyperlipidemia, unspecified: Secondary | ICD-10-CM | POA: Diagnosis not present

## 2016-07-25 DIAGNOSIS — M205X2 Other deformities of toe(s) (acquired), left foot: Secondary | ICD-10-CM | POA: Insufficient documentation

## 2016-07-25 DIAGNOSIS — I10 Essential (primary) hypertension: Secondary | ICD-10-CM | POA: Insufficient documentation

## 2016-07-25 DIAGNOSIS — M1990 Unspecified osteoarthritis, unspecified site: Secondary | ICD-10-CM | POA: Insufficient documentation

## 2016-07-25 DIAGNOSIS — Z86718 Personal history of other venous thrombosis and embolism: Secondary | ICD-10-CM | POA: Insufficient documentation

## 2016-07-25 DIAGNOSIS — Z79899 Other long term (current) drug therapy: Secondary | ICD-10-CM | POA: Diagnosis not present

## 2016-07-25 DIAGNOSIS — Z7982 Long term (current) use of aspirin: Secondary | ICD-10-CM | POA: Diagnosis not present

## 2016-07-25 DIAGNOSIS — I4891 Unspecified atrial fibrillation: Secondary | ICD-10-CM | POA: Diagnosis not present

## 2016-07-25 DIAGNOSIS — M205X1 Other deformities of toe(s) (acquired), right foot: Secondary | ICD-10-CM | POA: Diagnosis not present

## 2016-07-25 HISTORY — PX: AMPUTATION TOE: SHX6595

## 2016-07-25 HISTORY — DX: Essential (primary) hypertension: I10

## 2016-07-25 HISTORY — DX: Hyperlipidemia, unspecified: E78.5

## 2016-07-25 HISTORY — DX: Acute embolism and thrombosis of unspecified deep veins of unspecified lower extremity: I82.409

## 2016-07-25 HISTORY — DX: Unspecified osteoarthritis, unspecified site: M19.90

## 2016-07-25 SURGERY — AMPUTATION, TOE
Anesthesia: Choice | Laterality: Bilateral

## 2016-07-25 SURGERY — AMPUTATION, TOE
Anesthesia: General | Laterality: Bilateral

## 2016-07-25 SURGERY — AMPUTATION DIGIT
Anesthesia: General | Laterality: Bilateral

## 2016-07-25 MED ORDER — CEFAZOLIN SODIUM-DEXTROSE 2-4 GM/100ML-% IV SOLN: 2.0000 g | INTRAVENOUS | Status: DC

## 2016-07-25 MED ORDER — HYDROMORPHONE HCL 1 MG/ML IJ SOLN: 0.2500 mg | INTRAMUSCULAR | Status: DC | PRN

## 2016-07-25 MED ORDER — MEPERIDINE HCL 25 MG/ML IJ SOLN: 6.2500 mg | INTRAMUSCULAR | Status: DC | PRN

## 2016-07-25 MED ORDER — SCOPOLAMINE 1 MG/3DAYS TD PT72: 1.0000 | MEDICATED_PATCH | Freq: Once | TRANSDERMAL | Status: DC | PRN

## 2016-07-25 MED ORDER — MIDAZOLAM HCL 2 MG/2ML IJ SOLN: 1.0000 mg | INTRAMUSCULAR | Status: DC | PRN

## 2016-07-25 MED ORDER — FENTANYL CITRATE (PF) 100 MCG/2ML IJ SOLN
50.0000 ug | INTRAMUSCULAR | Status: DC | PRN
  Administered 2016-07-25 (×2): 50 ug via INTRAVENOUS

## 2016-07-25 MED ORDER — OXYCODONE HCL 5 MG PO TABS: 5.0000 mg | ORAL_TABLET | Freq: Once | ORAL | Status: DC | PRN

## 2016-07-25 MED ORDER — CHLORHEXIDINE GLUCONATE 4 % EX LIQD: 60.0000 mL | Freq: Once | CUTANEOUS | Status: DC

## 2016-07-25 MED ORDER — OXYCODONE HCL 5 MG/5ML PO SOLN: 5.0000 mg | Freq: Once | ORAL | Status: DC | PRN

## 2016-07-25 MED ORDER — FENTANYL CITRATE (PF) 100 MCG/2ML IJ SOLN
INTRAMUSCULAR | Status: AC
  Filled 2016-07-25: qty 2

## 2016-07-25 MED ORDER — PROMETHAZINE HCL 25 MG/ML IJ SOLN: 6.2500 mg | INTRAMUSCULAR | Status: DC | PRN

## 2016-07-25 MED ORDER — SODIUM CHLORIDE 0.9 % IV SOLN: INTRAVENOUS | Status: DC

## 2016-07-25 MED ORDER — LIDOCAINE 2% (20 MG/ML) 5 ML SYRINGE
INTRAMUSCULAR | Status: AC
  Filled 2016-07-25: qty 5

## 2016-07-25 MED ORDER — TRAMADOL HCL 50 MG PO TABS: 100.0000 mg | ORAL_TABLET | Freq: Three times a day (TID) | ORAL | 0 refills | Status: DC | PRN

## 2016-07-25 MED ORDER — ARTIFICIAL TEARS OP OINT
TOPICAL_OINTMENT | OPHTHALMIC | Status: AC
  Filled 2016-07-25: qty 7

## 2016-07-25 MED ORDER — LIDOCAINE-EPINEPHRINE 0.5 %-1:200000 IJ SOLN
INTRAMUSCULAR | Status: DC | PRN
  Administered 2016-07-25: 30 mL

## 2016-07-25 MED ORDER — ONDANSETRON HCL 4 MG/2ML IJ SOLN
INTRAMUSCULAR | Status: DC | PRN
  Administered 2016-07-25: 4 mg via INTRAVENOUS

## 2016-07-25 MED ORDER — PHENYLEPHRINE 40 MCG/ML (10ML) SYRINGE FOR IV PUSH (FOR BLOOD PRESSURE SUPPORT)
PREFILLED_SYRINGE | INTRAVENOUS | Status: AC
  Filled 2016-07-25: qty 10

## 2016-07-25 MED ORDER — PHENYLEPHRINE HCL 10 MG/ML IJ SOLN
INTRAMUSCULAR | Status: DC | PRN
Start: 2016-07-25 — End: 2016-07-25
  Administered 2016-07-25 (×4): 80 ug via INTRAVENOUS

## 2016-07-25 MED ORDER — BUPIVACAINE HCL 0.5 % IJ SOLN
INTRAMUSCULAR | Status: DC | PRN
  Administered 2016-07-25: 30 mL

## 2016-07-25 MED ORDER — 0.9 % SODIUM CHLORIDE (POUR BTL) OPTIME
TOPICAL | Status: DC | PRN
Start: 2016-07-25 — End: 2016-07-25
  Administered 2016-07-25: 1000 mL

## 2016-07-25 MED ORDER — PROPOFOL 10 MG/ML IV BOLUS
INTRAVENOUS | Status: DC | PRN
  Administered 2016-07-25: 150 mg via INTRAVENOUS

## 2016-07-25 MED ORDER — ONDANSETRON HCL 4 MG/2ML IJ SOLN
INTRAMUSCULAR | Status: AC
  Filled 2016-07-25: qty 6

## 2016-07-25 MED ORDER — LACTATED RINGERS IV SOLN
INTRAVENOUS | Status: DC
  Administered 2016-07-25 (×2): via INTRAVENOUS

## 2016-07-25 MED ORDER — LIDOCAINE 2% (20 MG/ML) 5 ML SYRINGE
INTRAMUSCULAR | Status: DC | PRN
  Administered 2016-07-25: 80 mg via INTRAVENOUS

## 2016-07-25 MED ORDER — CEFAZOLIN SODIUM-DEXTROSE 2-4 GM/100ML-% IV SOLN
2.0000 g | INTRAVENOUS | Status: AC
  Administered 2016-07-25: 2 g via INTRAVENOUS

## 2016-07-25 SURGICAL SUPPLY — 56 items
BLADE AVERAGE 25X9 (BLADE) IMPLANT
BLADE OSC/SAG .038X5.5 CUT EDG (BLADE) IMPLANT
BLADE SURG 10 STRL SS (BLADE) IMPLANT
BLADE SURG 15 STRL LF DISP TIS (BLADE) ×1 IMPLANT
BLADE SURG 15 STRL SS (BLADE) ×1
BNDG COHESIVE 4X5 TAN STRL (GAUZE/BANDAGES/DRESSINGS) ×2 IMPLANT
BNDG CONFORM 3 STRL LF (GAUZE/BANDAGES/DRESSINGS) IMPLANT
BNDG ESMARK 4X9 LF (GAUZE/BANDAGES/DRESSINGS) ×2 IMPLANT
CHLORAPREP W/TINT 26ML (MISCELLANEOUS) ×2 IMPLANT
COVER BACK TABLE 60X90IN (DRAPES) ×2 IMPLANT
DECANTER SPIKE VIAL GLASS SM (MISCELLANEOUS) IMPLANT
DRAPE EXTREMITY T 121X128X90 (DRAPE) ×2 IMPLANT
DRAPE SURG 17X23 STRL (DRAPES) ×2 IMPLANT
DRAPE U-SHAPE 47X51 STRL (DRAPES) ×2 IMPLANT
DRSG EMULSION OIL 3X3 NADH (GAUZE/BANDAGES/DRESSINGS) IMPLANT
DRSG MEPITEL 4X7.2 (GAUZE/BANDAGES/DRESSINGS) ×2 IMPLANT
DRSG PAD ABDOMINAL 8X10 ST (GAUZE/BANDAGES/DRESSINGS) IMPLANT
ELECT REM PT RETURN 9FT ADLT (ELECTROSURGICAL) ×2
ELECTRODE REM PT RTRN 9FT ADLT (ELECTROSURGICAL) ×1 IMPLANT
GAUZE SPONGE 4X4 12PLY STRL (GAUZE/BANDAGES/DRESSINGS) ×2 IMPLANT
GLOVE BIO SURGEON STRL SZ8 (GLOVE) ×2 IMPLANT
GLOVE BIOGEL PI IND STRL 8 (GLOVE) ×2 IMPLANT
GLOVE BIOGEL PI INDICATOR 8 (GLOVE) ×2
GLOVE ECLIPSE 8.0 STRL XLNG CF (GLOVE) ×2 IMPLANT
GOWN STRL REUS W/ TWL LRG LVL3 (GOWN DISPOSABLE) ×1 IMPLANT
GOWN STRL REUS W/ TWL XL LVL3 (GOWN DISPOSABLE) ×2 IMPLANT
GOWN STRL REUS W/TWL LRG LVL3 (GOWN DISPOSABLE) ×1
GOWN STRL REUS W/TWL XL LVL3 (GOWN DISPOSABLE) ×2
NDL SAFETY ECLIPSE 18X1.5 (NEEDLE) IMPLANT
NEEDLE HYPO 18GX1.5 SHARP (NEEDLE)
NEEDLE HYPO 25X1 1.5 SAFETY (NEEDLE) IMPLANT
NS IRRIG 1000ML POUR BTL (IV SOLUTION) ×2 IMPLANT
PACK BASIN DAY SURGERY FS (CUSTOM PROCEDURE TRAY) ×2 IMPLANT
PAD CAST 4YDX4 CTTN HI CHSV (CAST SUPPLIES) ×1 IMPLANT
PADDING CAST ABS 4INX4YD NS (CAST SUPPLIES)
PADDING CAST ABS COTTON 4X4 ST (CAST SUPPLIES) IMPLANT
PADDING CAST COTTON 4X4 STRL (CAST SUPPLIES) ×1
PENCIL BUTTON HOLSTER BLD 10FT (ELECTRODE) ×2 IMPLANT
SANITIZER HAND PURELL 535ML FO (MISCELLANEOUS) ×2 IMPLANT
SHEET MEDIUM DRAPE 40X70 STRL (DRAPES) ×2 IMPLANT
SLEEVE SCD COMPRESS KNEE MED (MISCELLANEOUS) ×2 IMPLANT
SPONGE LAP 18X18 X RAY DECT (DISPOSABLE) ×2 IMPLANT
STOCKINETTE 6  STRL (DRAPES) ×1
STOCKINETTE 6 STRL (DRAPES) ×1 IMPLANT
SUCTION FRAZIER HANDLE 10FR (MISCELLANEOUS)
SUCTION TUBE FRAZIER 10FR DISP (MISCELLANEOUS) IMPLANT
SUT ETHILON 2 0 FS 18 (SUTURE) IMPLANT
SUT ETHILON 2 0 FSLX (SUTURE) IMPLANT
SUT ETHILON 3 0 PS 1 (SUTURE) ×2 IMPLANT
SUT MNCRL AB 3-0 PS2 18 (SUTURE) IMPLANT
SWAB COLLECTION DEVICE MRSA (MISCELLANEOUS) IMPLANT
SYR BULB 3OZ (MISCELLANEOUS) ×2 IMPLANT
SYR CONTROL 10ML LL (SYRINGE) IMPLANT
TOWEL OR 17X24 6PK STRL BLUE (TOWEL DISPOSABLE) ×2 IMPLANT
TUBE CONNECTING 20X1/4 (TUBING) IMPLANT
UNDERPAD 30X30 (UNDERPADS AND DIAPERS) ×2 IMPLANT

## 2016-07-25 NOTE — Transfer of Care (Signed)
Immediate Anesthesia Transfer of Care Note  Patient: Kimberly Villegas  Procedure(s) Performed: Procedure(s): bilateral 2nd toe amputations (Bilateral)  Patient Location: PACU  Anesthesia Type:General  Level of Consciousness: awake and alert   Airway & Oxygen Therapy: Patient Spontanous Breathing and Patient connected to face mask oxygen  Post-op Assessment: Report given to RN and Post -op Vital signs reviewed and stable  Post vital signs: Reviewed and stable  Last Vitals:  Vitals:   07/25/16 1249 07/25/16 1458  BP: (!) 149/71 (!) 127/59  Pulse: 92 (!) 106  Resp: 18   Temp: 36.8 C     Last Pain:  Vitals:   07/25/16 1249  TempSrc: Oral      Patients Stated Pain Goal: 0 (88/71/95 9747)  Complications: No apparent anesthesia complications

## 2016-07-25 NOTE — Op Note (Signed)
NAME:  Kimberly Villegas, ESTIS NO.:  [id number]  MEDICAL RECORD NO.:  [id number]  LOCATION:                                 FACILITY:  PHYSICIAN:  Wylene Simmer, MD             DATE OF BIRTH:  DATE OF PROCEDURE:  07/25/2016 DATE OF DISCHARGE:                              OPERATIVE REPORT   PREOPERATIVE DIAGNOSIS:  Bilateral second crossover toe deformities.  POSTOPERATIVE DIAGNOSIS:  Bilateral second crossover toe deformities.  PROCEDURE:  Bilateral second toe amputations through the metatarsophalangeal joint.  SURGEON:  Wylene Simmer, M.D.  ANESTHESIA:  General, local.  ESTIMATED BLOOD LOSS:  Minimal.  TOURNIQUET TIME:  Less than 10 minutes on each side with an ankle Esmarch.  COMPLICATIONS:  None apparent.  DISPOSITION:  Extubated, awake, and stable to Recovery.  INDICATIONS FOR PROCEDURE:  The patient is a 82 year old woman with painful bilateral second crossover toe deformities.  She has failed nonoperative treatment to date including activity modification and shoe wear modification.  She has also tried toe taping and toe splints.  She understands the risks and benefits of the alternative treatment options and would like to proceed with bilateral second toe amputations in an effort to treat these painful toe deformities.  She understands the risks and benefits of the alternative for reconstruction.  She specifically understands risks of bleeding, infection, nerve damage, blood clots, need for additional surgery, continued pain, revision amputation, and death.  DESCRIPTION OF PROCEDURE:  After preoperative consent was obtained, the correct operative site was identified and the patient was brought to the operating room and placed supine on the operating table.  General anesthesia was induced.  Preoperative antibiotics were administered. Surgical time-out was taken.  Both lower extremities were prepped and draped in standard sterile fashion.  The left foot  was exsanguinated and Esmarch tourniquet wrapped around the ankle.  A racquet style incision was marked on the skin.  The incision was made.  Sharp dissection was carried down through skin, subcutaneous tissues.  Subperiosteal dissection was then carried along the base of the proximal phalanx to the MTP joint.  The toe was disarticulated through this joint and passed off the field.  Tourniquet was released.  Hemostasis was achieved.  The wound was irrigated.  Incision was closed with horizontal mattress sutures of 3-0 nylon.  A combination of lidocaine, Marcaine, and epinephrine was infiltrated into the intermetatarsal space on both sides of the operative site.  Sterile dressings were applied followed by compression wrap.  Attention was then turned to the right lower extremity where the same procedure was performed in the same fashion.  Again, the operative site was infiltrated with Marcaine, lidocaine, and epinephrine, and the wound was closed with nylon.  Sterile dressings were applied followed by compression wrap.  The patient was awakened from anesthesia and transported to the recovery room in stable condition.  FOLLOWUP PLAN:  The patient will be weightbearing as tolerated on both lower extremities.  She will start her Eliquis tomorrow for prophylaxis due to her recent diagnosis of atrial fibrillation.  She will follow up with me in the office in 2 weeks for suture removal.  Wylene Simmer, MD     JH/MEDQ  D:  07/25/2016  T:  07/25/2016  Job:  [id number]

## 2016-07-25 NOTE — H&P (Signed)
Kimberly Villegas is an 82 y.o. female.   Chief Complaint:  bilat 2nd crossover toes HPI: 82 y/o female with bilat 2nd crossover toe deformities.  She presents today for bilat 2nd toe amputations.  She was diagnosed with a fib yesterday.  She is cleared for surgery by her cardiologist Dr. Nadyne Coombes with the plan to start anticoagulation after surgery.  Past Medical History:  Diagnosis Date  . Arthritis   . DVT (deep venous thrombosis) ([place])    after vein stripping years ago  . Hyperlipemia   . Hypertension     Past Surgical History:  Procedure Laterality Date  . FOOT SURGERY Left   . JOINT REPLACEMENT Right   . VEIN LIGATION AND STRIPPING Right     History reviewed. No pertinent family history. Social History:  reports that she has never smoked. She has never used smokeless tobacco. She reports that she drinks alcohol. She reports that she does not use drugs.  Allergies: No Known Allergies  Medications Prior to Admission  Medication Sig Dispense Refill  . aspirin EC 81 MG tablet Take 81 mg by mouth daily.    Marland Kitchen atorvastatin (LIPITOR) 40 MG tablet Take 40 mg by mouth daily.    . Calcium-Vit D-Arg-Inos-Silicon (BONE DENSITY PO) Take by mouth.    . losartan (COZAAR) 50 MG tablet Take 50 mg by mouth daily.    . metoprolol tartrate (LOPRESSOR) 25 MG tablet Take 25 mg by mouth 2 (two) times daily.    . Misc Natural Products (FIBER 7) POWD Take by mouth.    . Multiple Vitamins-Minerals (PRESERVISION AREDS PO) Take by mouth.    . zolpidem (AMBIEN) 5 MG tablet Take 5 mg by mouth at bedtime as needed for sleep.      No results found for this or any previous visit (from the past 48 hour(s)). No results found.  ROS  No recent f/c/n/v/wt loss/SOB/CP.  Blood pressure (!) 149/71, pulse 92, temperature 98.2 F (36.8 C), temperature source Oral, resp. rate 18, height 5\' 5"  (1.651 m), weight 63.1 kg (139 lb 3.2 oz), SpO2 100 %. Physical Exam  wn wd woman in nad.  A and O x 4.  Mood and affect  normal.  EOMI.  resp unlabored.  Bilat feet with 2nd crossover toes.  Skin o/w healthy and intact.  No lymphadenopathy.  Sens to LT dorsally and plantarly intact.    Assessment/Plan Bilat 2nd crossover toe deformities.  To OR for bilat 2nd toe amputations.  The risks and benefits of the alternative treatment options have been discussed in detail.  The patient wishes to proceed with surgery and specifically understands risks of bleeding, infection, nerve damage, blood clots, need for additional surgery, amputation and death.   Wylene Simmer, MD 2016-07-26, 2:02 PM

## 2016-07-25 NOTE — Anesthesia Postprocedure Evaluation (Signed)
Anesthesia Post Note  Patient: Kimberly Villegas  Procedure(s) Performed: Procedure(s) (LRB): bilateral 2nd toe amputations (Bilateral)  Patient location during evaluation: PACU Anesthesia Type: General Level of consciousness: awake and alert Pain management: pain level controlled Vital Signs Assessment: post-procedure vital signs reviewed and stable Respiratory status: spontaneous breathing, nonlabored ventilation and respiratory function stable Cardiovascular status: blood pressure returned to baseline and stable Postop Assessment: no signs of nausea or vomiting Anesthetic complications: no       Last Vitals:  Vitals:   07/25/16 1515 07/25/16 1530  BP: 93/75 (!) 115/55  Pulse:  83  Resp: 20 16  Temp:      Last Pain:  Vitals:   07/25/16 1530  TempSrc:   PainSc: 0-No pain    LLE Motor Response: Purposeful movement (07/25/16 1530) LLE Sensation: Full sensation (07/25/16 1530) RLE Motor Response: Purposeful movement (07/25/16 1530) RLE Sensation: Full sensation (07/25/16 1530)      Lynda Rainwater

## 2016-07-25 NOTE — Anesthesia Preprocedure Evaluation (Signed)
Anesthesia Evaluation  Patient identified by MRN, date of birth, ID band Patient awake    Reviewed: Allergy & Precautions, NPO status , Patient's Chart, lab work & pertinent test results, reviewed documented beta blocker date and time   Airway Mallampati: II  TM Distance: >3 FB Neck ROM: Full    Dental no notable dental hx.    Pulmonary neg pulmonary ROS,    Pulmonary exam normal breath sounds clear to auscultation       Cardiovascular hypertension, negative cardio ROS Normal cardiovascular exam+ dysrhythmias Atrial Fibrillation  Rhythm:Regular Rate:Normal     Neuro/Psych negative neurological ROS  negative psych ROS   GI/Hepatic negative GI ROS, Neg liver ROS,   Endo/Other  negative endocrine ROS  Renal/GU negative Renal ROS  negative genitourinary   Musculoskeletal negative musculoskeletal ROS (+) Arthritis ,   Abdominal   Peds negative pediatric ROS (+)  Hematology negative hematology ROS (+)   Anesthesia Other Findings   Reproductive/Obstetrics negative OB ROS                             Anesthesia Physical Anesthesia Plan  ASA: III  Anesthesia Plan: General   Post-op Pain Management:    Induction: Intravenous  Airway Management Planned: LMA  Additional Equipment:   Intra-op Plan:   Post-operative Plan: Extubation in OR  Informed Consent: I have reviewed the patients History and Physical, chart, labs and discussed the procedure including the risks, benefits and alternatives for the proposed anesthesia with the patient or authorized representative who has indicated his/her understanding and acceptance.   Dental advisory given  Plan Discussed with: CRNA  Anesthesia Plan Comments:         Anesthesia Quick Evaluation

## 2016-07-25 NOTE — Brief Op Note (Signed)
07/25/2016  2:57 PM  PATIENT:  Kimberly Villegas  82 y.o. female  PRE-OPERATIVE DIAGNOSIS:  Bilateral 2nd crossover toes  POST-OPERATIVE DIAGNOSIS:  same  Procedure(s):  bilateral 2nd toe amputations  SURGEON:  Wylene Simmer, MD  ASSISTANT: n/a  ANESTHESIA:   General, local  EBL:  minimal   TOURNIQUET:  < 10 min on each side with ankle esmarch  COMPLICATIONS:  None apparent  DISPOSITION:  Extubated, awake and stable to recovery.  DICTATION ID:  [id number]

## 2016-07-25 NOTE — Discharge Instructions (Addendum)
Kimberly Simmer, MD [place]  Please read the following information regarding your care after surgery.  Medications  You only need a prescription for the narcotic pain medicine (ex. oxycodone, Percocet, Norco).  All of the other medicines listed below are available over the counter. X acetominophen (Tylenol) 650 mg every 4-6 hours as you need for minor pain X tramadol as prescribed for moderate to severe pain X start  Eliquus tomorrow.   Weight Bearing ? Bear weight when you are able on your operated leg or foot. X Bear weight in the post-op shoes ? Do not bear any weight on the operated leg or foot.  Cast / Splint / Dressing X Keep your splint or cast clean and dry.  Dont put anything (coat hanger, pencil, etc) down inside of it.  If it gets damp, use a hair dryer on the cool setting to dry it.  If it gets soaked, call the office to schedule an appointment for a cast change. ? Remove your dressing 3 days after surgery and cover the incisions with dry dressings.    After your dressing, cast or splint is removed; you may shower, but do not soak or scrub the wound.  Allow the water to run over it, and then gently pat it dry.  Swelling It is normal for you to have swelling where you had surgery.  To reduce swelling and pain, keep your toes above your nose for at least 3 days after surgery.  It may be necessary to keep your foot or leg elevated for several weeks.  If it hurts, it should be elevated.  Follow Up Call my office at [phone] when you are discharged from the hospital or surgery center to schedule an appointment to be seen two weeks after surgery.  Call my office at [phone] if you develop a fever >101.5 F, nausea, vomiting, bleeding from the surgical site or severe pain.     Post Anesthesia Home Care Instructions  Activity: Get plenty of rest for the remainder of the day. A responsible individual must stay with you for 24 hours following the procedure.    For the next 24 hours, DO NOT: -Drive a car -Paediatric nurse -Drink alcoholic beverages -Take any medication unless instructed by your physician -Make any legal decisions or sign important papers.  Meals: Start with liquid foods such as gelatin or soup. Progress to regular foods as tolerated. Avoid greasy, spicy, heavy foods. If nausea and/or vomiting occur, drink only clear liquids until the nausea and/or vomiting subsides. Call your physician if vomiting continues.  Special Instructions/Symptoms: Your throat may feel dry or sore from the anesthesia or the breathing tube placed in your throat during surgery. If this causes discomfort, gargle with warm salt water. The discomfort should disappear within 24 hours.  If you had a scopolamine patch placed behind your ear for the management of post- operative nausea and/or vomiting:  1. The medication in the patch is effective for 72 hours, after which it should be removed.  Wrap patch in a tissue and discard in the trash. Wash hands thoroughly with soap and water. 2. You may remove the patch earlier than 72 hours if you experience unpleasant side effects which may include dry mouth, dizziness or visual disturbances. 3. Avoid touching the patch. Wash your hands with soap and water after contact with the patch.

## 2016-07-25 NOTE — Anesthesia Procedure Notes (Signed)
Procedure Name: LMA Insertion Date/Time: 07/25/2016 2:15 PM Performed by: Lieutenant Diego Pre-anesthesia Checklist: Patient identified, Emergency Drugs available, Suction available and Patient being monitored Patient Re-evaluated:Patient Re-evaluated prior to inductionOxygen Delivery Method: Circle system utilized Preoxygenation: Pre-oxygenation with 100% oxygen Intubation Type: IV induction Ventilation: Mask ventilation without difficulty LMA: LMA inserted LMA Size: 4.0 Number of attempts: 1 Airway Equipment and Method: Bite block Placement Confirmation: positive ETCO2 and breath sounds checked- equal and bilateral Tube secured with: Tape Dental Injury: Teeth and Oropharynx as per pre-operative assessment

## 2016-07-26 ENCOUNTER — Encounter (HOSPITAL_BASED_OUTPATIENT_CLINIC_OR_DEPARTMENT_OTHER): Payer: Self-pay | Admitting: Orthopedic Surgery

## 2016-07-27 ENCOUNTER — Emergency Department (HOSPITAL_COMMUNITY)
Admission: EM | Admit: 2016-07-27 | Discharge: 2016-07-27 | Disposition: A | Discharge status: Home / Self Care | Payer: Medicare HMO | Attending: Emergency Medicine | Admitting: Emergency Medicine

## 2016-07-27 ENCOUNTER — Encounter (HOSPITAL_COMMUNITY): Payer: Self-pay | Admitting: Emergency Medicine

## 2016-07-27 DIAGNOSIS — Z7901 Long term (current) use of anticoagulants: Secondary | ICD-10-CM | POA: Diagnosis not present

## 2016-07-27 DIAGNOSIS — I10 Essential (primary) hypertension: Secondary | ICD-10-CM | POA: Diagnosis not present

## 2016-07-27 DIAGNOSIS — Z7982 Long term (current) use of aspirin: Secondary | ICD-10-CM | POA: Diagnosis not present

## 2016-07-27 DIAGNOSIS — L7621 Postprocedural hemorrhage and hematoma of skin and subcutaneous tissue following a dermatologic procedure: Secondary | ICD-10-CM | POA: Diagnosis present

## 2016-07-27 DIAGNOSIS — L7622 Postprocedural hemorrhage and hematoma of skin and subcutaneous tissue following other procedure: Secondary | ICD-10-CM | POA: Diagnosis not present

## 2016-07-27 DIAGNOSIS — Z79899 Other long term (current) drug therapy: Secondary | ICD-10-CM | POA: Insufficient documentation

## 2016-07-27 LAB — CBC WITH DIFFERENTIAL/PLATELET
Basophils Absolute: 0 10*3/uL (ref 0.0–0.1)
Basophils Relative: 0 %
Eosinophils Absolute: 0.1 10*3/uL (ref 0.0–0.7)
Eosinophils Relative: 2 %
HCT: 36.2 % (ref 36.0–46.0)
Hemoglobin: 11.9 g/dL — ABNORMAL LOW (ref 12.0–15.0)
Lymphocytes Relative: 34 %
Lymphs Abs: 2.3 10*3/uL (ref 0.7–4.0)
MCH: 32.7 pg (ref 26.0–34.0)
MCHC: 32.9 g/dL (ref 30.0–36.0)
MCV: 99.5 fL (ref 78.0–100.0)
Monocytes Absolute: 0.6 10*3/uL (ref 0.1–1.0)
Monocytes Relative: 9 %
Neutro Abs: 3.8 10*3/uL (ref 1.7–7.7)
Neutrophils Relative %: 55 %
Platelets: 182 10*3/uL (ref 150–400)
RBC: 3.64 MIL/uL — ABNORMAL LOW (ref 3.87–5.11)
RDW: 13.3 % (ref 11.5–15.5)
WBC: 6.9 10*3/uL (ref 4.0–10.5)

## 2016-07-27 LAB — BASIC METABOLIC PANEL
Anion gap: 8 (ref 5–15)
BUN: 18 mg/dL (ref 6–20)
CO2: 28 mmol/L (ref 22–32)
Calcium: 9.6 mg/dL (ref 8.9–10.3)
Chloride: 103 mmol/L (ref 101–111)
Creatinine, Ser: 0.95 mg/dL (ref 0.44–1.00)
GFR calc Af Amer: >60 mL/min (ref >60)
GFR calc non Af Amer: 55 mL/min — ABNORMAL LOW (ref >60)
Glucose, Bld: 87 mg/dL (ref 65–99)
Potassium: 3.9 mmol/L (ref 3.5–5.1)
Sodium: 139 mmol/L (ref 135–145)

## 2016-07-27 LAB — PROTIME-INR
INR: 1.48
Prothrombin Time: 18.1 seconds — ABNORMAL HIGH (ref 11.4–15.2)

## 2016-07-27 MED ORDER — SILVER NITRATE-POT NITRATE 75-25 % EX MISC
1.0000 | Freq: Once | CUTANEOUS | Status: AC
  Administered 2016-07-27: 1 via TOPICAL
  Filled 2016-07-27: qty 1

## 2016-07-27 NOTE — ED Notes (Signed)
ED Provider at bedside. 

## 2016-07-27 NOTE — ED Triage Notes (Addendum)
Patient had surgery 07/25/16 amputation of 2nd left and 2nd right toe. Patient noticed small amount of red blood. When patient woke up noticed a little more both bandages. Denies pain at this time and last took pain medication at 0100 today. Patient took Eliquis today at 33.

## 2016-07-27 NOTE — ED Provider Notes (Signed)
[facility] DEPT Provider Note   CSN: [id number] Arrival date & time: 07/27/16  1044     History   Chief Complaint Chief Complaint  Patient presents with  . Post-op Problem    HPI Kimberly Villegas is a 82 y.o. female.  She presents today for evaluation of oozing blood on her right foot.  She first noticed the oozing last night, on only the right food, and reports that it has continued since with out stop.  It is not associated with any increased pain, numbness or tingling. On 07/25/16 she had a bilateral second toe amputation to treat hammertoe.  She reports that during her preop workup she had an EKG that revealed a "abnormal heart rhythm."  Yesterday morning (3/23) reports that she began taking Eliquis 5mg  BID and has not missed any doses.  She reports no nausea, vomiting, calf/leg pain, recent activity increase, or increased pain. She did not remove her dressings, states they are supposed to be left on for two weeks.  She reports she has not gotten her dressings wet.       Past Medical History:  Diagnosis Date  . Arthritis   . DVT (deep venous thrombosis) ([place])    after vein stripping years ago  . Hyperlipemia   . Hypertension     There are no active problems to display for this patient.   Past Surgical History:  Procedure Laterality Date  . AMPUTATION TOE Bilateral 07/25/2016   Procedure: bilateral 2nd toe amputations;  Surgeon: Wylene Simmer, MD;  Location: [facility];  Service: Orthopedics;  Laterality: Bilateral;  . FOOT SURGERY Left   . JOINT REPLACEMENT Right   . VEIN LIGATION AND STRIPPING Right     OB History    No data available       Home Medications    Prior to Admission medications   Medication Sig Start Date End Date Taking? Authorizing Provider  albuterol (PROVENTIL HFA;VENTOLIN HFA) 108 (90 Base) MCG/ACT inhaler Inhale 2 puffs into the lungs every 4 (four) hours as needed for wheezing or shortness of breath.   Yes Historical  Provider, MD  apixaban (ELIQUIS) 5 MG TABS tablet Take 5 mg by mouth 2 (two) times daily.   Yes Historical Provider, MD  Ascorbic Acid (VITAMIN C PO) Take 1 tablet by mouth daily.   Yes Historical Provider, MD  aspirin EC 81 MG tablet Take 81 mg by mouth daily.   Yes Historical Provider, MD  atorvastatin (LIPITOR) 40 MG tablet Take 40 mg by mouth daily.   Yes Historical Provider, MD  Calcium-Vit D-Arg-Inos-Silicon (BONE DENSITY PO) Take 1-2 tablets by mouth See admin instructions. Take 1 tablet by mouth in the morning and take 2 tablets by mouth in the evening   Yes Historical Provider, MD  losartan (COZAAR) 50 MG tablet Take 50 mg by mouth daily.   Yes Historical Provider, MD  metoprolol tartrate (LOPRESSOR) 25 MG tablet Take 50 mg by mouth 2 (two) times daily.    Yes Historical Provider, MD  Misc Natural Products (FIBER 7) POWD Take 1 scoop by mouth daily.    Yes Historical Provider, MD  Multiple Vitamins-Calcium (ONE-A-DAY WOMENS PO) Take 1 tablet by mouth daily.   Yes Historical Provider, MD  Multiple Vitamins-Minerals (PRESERVISION AREDS PO) Take 1 capsule by mouth 2 (two) times daily.    Yes Historical Provider, MD  traMADol (ULTRAM) 50 MG tablet Take 2 tablets (100 mg total) by mouth every 8 (eight) hours as needed  for severe pain. 07/25/16  Yes Wylene Simmer, MD  zolpidem (AMBIEN) 5 MG tablet Take 5 mg by mouth at bedtime as needed for sleep.   Yes Historical Provider, MD    Family History No family history on file.  Social History Social History  Substance Use Topics  . Smoking status: Never Smoker  . Smokeless tobacco: Never Used  . Alcohol use Yes     Comment: glass of wine with dinner     Allergies   Patient has no known allergies.   Review of Systems Review of Systems  Constitutional: Negative for chills and fever.  HENT: Negative for ear pain and sore throat.   Eyes: Negative for pain and visual disturbance.  Respiratory: Negative for cough and shortness of breath.     Cardiovascular: Negative for chest pain and palpitations.  Gastrointestinal: Negative for abdominal pain and vomiting.  Genitourinary: Negative for dysuria and hematuria.  Musculoskeletal: Negative for arthralgias and back pain.  Skin: Negative for color change and rash.  Neurological: Negative for seizures and syncope.  Hematological: Bruises/bleeds easily.  All other systems reviewed and are negative.    Physical Exam Updated Vital Signs BP (!) 119/49 (BP Location: Left Arm)   Pulse 70   Temp 98.3 F (36.8 C)   Resp 16   Ht 5\' 5"  (1.651 m)   Wt 63 kg   SpO2 97%   BMI 23.13 kg/m   Physical Exam  Constitutional: She appears well-developed and well-nourished. No distress.  HENT:  Head: Normocephalic and atraumatic.  Eyes: Conjunctivae are normal.  Neck: Normal range of motion.  Cardiovascular: Normal rate.   Pulmonary/Chest: Effort normal. No stridor.  Abdominal: She exhibits no distension.  Musculoskeletal: She exhibits no deformity.  Neurological: She is alert.  Skin: Skin is warm and dry.  Psychiatric: She has a normal mood and affect. Her behavior is normal.  Nursing note and vitals reviewed.  Right foot dressing was not removed as no reported problem.  Left dressing was removed (obvious strikethrough/bleeding through the dressing) which revealed an actively oozing 3-6 mm area of subcutaneous tissue.  All sutures intact.  No obvious infection, or purulent material drained.  No induration, fluctuance, redness or streaking around wound.    ED Treatments / Results  Labs (all labs ordered are listed, but only abnormal results are displayed) Labs Reviewed  CBC WITH DIFFERENTIAL/PLATELET - Abnormal; Notable for the following:       Result Value   RBC 3.64 (*)    Hemoglobin 11.9 (*)    All other components within normal limits  BASIC METABOLIC PANEL - Abnormal; Notable for the following:    GFR calc non Af Amer 55 (*)    All other components within normal limits   PROTIME-INR - Abnormal; Notable for the following:    Prothrombin Time 18.1 (*)    All other components within normal limits    EKG  EKG Interpretation None       Radiology No results found.  Procedures Cauterization Date/Time: 07/27/2016 3:05 PM Performed by: Lorin Glass Authorized by: Lorin Glass  Consent: Verbal consent obtained. Risks and benefits: risks, benefits and alternatives were discussed Consent given by: patient Patient understanding: patient states understanding of the procedure being performed Patient identity confirmed: verbally with patient and arm band Patient tolerance: Patient tolerated the procedure well with no immediate complications Comments: Used silver nitrite to cauterize area of oozing blood where second right toe was removed from.  Medications Ordered in ED Medications  silver nitrate applicators applicator 1 Stick (1 Stick Topical Given 07/27/16 1409)     Initial Impression / Assessment and Plan / ED Course  I have reviewed the triage vital signs and the nursing notes.  Pertinent labs & imaging results that were available during my care of the patient were reviewed by me and considered in my medical decision making (see chart for details).    Patient presented with post-operative site oozing of blood.  She started on Eliquis 5mg  BID for irregular heart rhythm and approximately 12 hours later noted this oozing started.  She reports no fever, chills, or constitutional symptoms consistent with infection.  She reports no increase in pain.  The dressing was removed, after discussion with Dr. Rex Kras, and the wound was examined.  There was a discrete site of capillary oozing from exposed subcutaneous tissue that was controlled with scant/minimal amount of silver nitrate with care not to apply to healthy skin, only to exposed subcutaneous tissue.   Hemostasis was obtained. Foot was re-bandaged. The incision site was never touched  (other than with silver nitrite) and care was taken to keep the wound clean, Patient stable for discharge.     At this time there does not appear to be any evidence of an acute emergency medical condition and the patient appears stable for discharge with appropriate outpatient follow up.Diagnosis was discussed with patient who verbalizes understanding and is agreeable to discharge. Pt case discussed with Dr. Rex Kras who agrees with my plan.     Final Clinical Impressions(s) / ED Diagnoses   Final diagnoses:  Postoperative hemorrhage of skin following non-dermatologic procedure    New Prescriptions New Prescriptions   No medications on file     Lorin Glass, [place] 07/27/16 [place], MD 07/28/16 [phone]

## 2016-08-01 DIAGNOSIS — I771 Stricture of artery: Secondary | ICD-10-CM | POA: Diagnosis not present

## 2016-08-01 DIAGNOSIS — I10 Essential (primary) hypertension: Secondary | ICD-10-CM | POA: Diagnosis not present

## 2016-08-01 DIAGNOSIS — I4891 Unspecified atrial fibrillation: Secondary | ICD-10-CM | POA: Diagnosis not present

## 2016-08-15 DIAGNOSIS — I4891 Unspecified atrial fibrillation: Secondary | ICD-10-CM | POA: Diagnosis not present

## 2016-08-16 DIAGNOSIS — Z89421 Acquired absence of other right toe(s): Secondary | ICD-10-CM | POA: Diagnosis not present

## 2016-08-16 DIAGNOSIS — Z89422 Acquired absence of other left toe(s): Secondary | ICD-10-CM | POA: Diagnosis not present

## 2016-08-19 DIAGNOSIS — I4891 Unspecified atrial fibrillation: Secondary | ICD-10-CM | POA: Diagnosis not present

## 2016-08-19 DIAGNOSIS — I771 Stricture of artery: Secondary | ICD-10-CM | POA: Diagnosis not present

## 2016-08-19 DIAGNOSIS — I080 Rheumatic disorders of both mitral and aortic valves: Secondary | ICD-10-CM | POA: Diagnosis not present

## 2016-08-19 DIAGNOSIS — I10 Essential (primary) hypertension: Secondary | ICD-10-CM | POA: Diagnosis not present

## 2016-09-02 DIAGNOSIS — M5137 Other intervertebral disc degeneration, lumbosacral region: Secondary | ICD-10-CM | POA: Diagnosis not present

## 2016-09-02 DIAGNOSIS — M9903 Segmental and somatic dysfunction of lumbar region: Secondary | ICD-10-CM | POA: Diagnosis not present

## 2016-09-02 DIAGNOSIS — M9902 Segmental and somatic dysfunction of thoracic region: Secondary | ICD-10-CM | POA: Diagnosis not present

## 2016-09-02 DIAGNOSIS — S29012A Strain of muscle and tendon of back wall of thorax, initial encounter: Secondary | ICD-10-CM | POA: Diagnosis not present

## 2016-09-02 DIAGNOSIS — M47817 Spondylosis without myelopathy or radiculopathy, lumbosacral region: Secondary | ICD-10-CM | POA: Diagnosis not present

## 2016-09-02 DIAGNOSIS — M9905 Segmental and somatic dysfunction of pelvic region: Secondary | ICD-10-CM | POA: Diagnosis not present

## 2016-09-04 DIAGNOSIS — L602 Onychogryphosis: Secondary | ICD-10-CM | POA: Diagnosis not present

## 2016-09-04 DIAGNOSIS — M21612 Bunion of left foot: Secondary | ICD-10-CM | POA: Diagnosis not present

## 2016-09-04 DIAGNOSIS — M2041 Other hammer toe(s) (acquired), right foot: Secondary | ICD-10-CM | POA: Diagnosis not present

## 2016-09-04 DIAGNOSIS — M2042 Other hammer toe(s) (acquired), left foot: Secondary | ICD-10-CM | POA: Diagnosis not present

## 2016-09-04 DIAGNOSIS — M21611 Bunion of right foot: Secondary | ICD-10-CM | POA: Diagnosis not present

## 2016-09-05 DIAGNOSIS — I4891 Unspecified atrial fibrillation: Secondary | ICD-10-CM | POA: Diagnosis not present

## 2016-09-07 DIAGNOSIS — I4891 Unspecified atrial fibrillation: Secondary | ICD-10-CM | POA: Diagnosis present

## 2016-09-07 NOTE — H&P (Signed)
OFFICE VISIT NOTES COPIED TO EPIC FOR DOCUMENTATION  . History of Present Illness Kimberly Villegas [name] MD; 08/19/2016 7:32 PM) Patient words: Last OV 08/01/2016; 2 week FU for A,Fib, echo results.  The patient is a 82 year old female who presents for a Follow-up for Atrial fibrillation. Kimberly Villegas is a 82 years old white female. She has history of A. Fib, left subclavian stenosis, mitral and aortic regurgitation. Small AAA.  Patient had EKG on 07/24/2016 for preop evaluation, it revealed atrial fibrillation. She was essentially asymptomatic. Patient did undergo surgery on 07/25/2016 for bilateral 2nd toe removal. She did well during the surgery. She was started on Eliquis therapy postoperatively.  Patient denies chest pain, shortness of breath, PND, orthopnea, palpitations, dizziness, near-syncope or syncopal episodes. No symptoms of TIA. She is complaining of feeling very tired. No history of swelling on the legs and no leg claudication.  Patient denies any c/o of claudication or fatigue in the left arm. No history of vertigo, dizziness, near-syncope or syncope. No history of diplopia or any other visual disturbances. No history of any spells of sudden numbness or weakness in one half of the body. No other neurological symptoms.  Patient has hypertension. She has been checking blood pressures regularly at home. Blood pressure has always been normal, recorded in the right arm. She also has hypercholesterolemia. No history of diabetes. She does not smoke. She does not walk or do any type of exercises regularly.  No history of thyroid problems. No history of TIA or CVA. Patient had venous thrombosis in the leg approximately 7 years ago following varicose vein stripping. There is no history of pulmonary embolism. No history of myocardial infarction.  No history of hematuria, GI bleed or bleeding from any other site.      Problem List/Past Medical Kimberly Villegas; 08/19/2016 3:49  PM) Subclavian artery stenosis, left (I77.1)  Subclavian artery duplex 07/18/2016: Left subclavian artery is abnormal, monophasic, suggestive of proximal obstruction. Left vertebral artery waveform shows high resistance. Antegrade right vertebral artery flow. Blood Pressure Right: 155/80 Left: 135/75 No hemodynamically significant arterial disease in the common carotid artery bilaterally. Compared to the study done on 05/30/2015, left subclavian artery monophasic waveform now well demonstrated. Otherwise no significant change. Carotid stenosis, bilateral (Q73.41)  Carotid artery duplex 05/30/2015: Left vertebral artery waveform shows high resistance and may indicate distal stenosis or occlusion. No significant BP differential between the arms. Antegrade right vertebral artery flow. Antegrade left vertebral artery flow. No hemodynamically significant arterial disease in the internal carotid artery bilaterally. Minimal plaque. Clinical correlation recommended. Recheck in 1 year if clinically indicated. After review of the patient's chart, patient has pressure differential in the upper extremities, the low flow state in the left vertebral artery is due to probably proximal left subclavian stenosis. Patient set up for subclavian artery duplex. Abdominal bruit (R09.89)  Benign Essential Hypertension (I10)  Hypercholesteremia (E78.00)  PAD (peripheral artery disease) (I73.9)  Lower extremity arterial duplex 05/24/2015: No hemodynamically significant stenoses are identified in bilateral lower extremity arterial system. Mild plaque noted. This exam reveals normal perfusion of both the lower extremities with RABI >1.14 and LABI 1.07. Mitral and aortic regurgitation (I08.0)  Echocardiogram 08/15/2016: Left ventricle cavity is normal in size. Mild to moderate decrease in LV systolic function with global hypokinesis. Unable to evaluate diastolic function due to A. Fibrillation. Visual EF 40-45% Left atrial  cavity is moderately dilated by volume. Measured 3.7 cm Right atrial cavity is mildly dilated. Right ventricle cavity is mild  to moderately dilated. Normal right ventricular function. Mild to moderate aortic regurgitation. Mild to moderate mitral regurgitation. Mild to moderate tricuspid regurgitation. Mild pulmonary hypertension. Pulmonary artery systolic pressure is estimated at 36 mm Hg. Compared to 05/30/15, A. Fib new. EF decreased from normal. MR and TR slightly worse and no change in PHT. AAA (abdominal aortic aneurysm) without rupture (I71.4)  Abdominal aortic duplex 05/24/2015: Diffuse mixed plaque noted in the entire aorta. An abdominal aortic aneurysm measuring 2.92 x 2.85 x 2.9 cm is seen. Enlarged inferior vena cava suggests elevated right heart pressure. Recheck in 6 months. New onset atrial fibrillation (I48.91)  CHADS2VASC: 5. Risk Category: high risk. Yearly risk of stroke: 6.7% Labwork  Labs 04/18/2016: Serum glucose 101 mg, BUN 18, creatinine 0.8, eGFR 69 mL, potassium 4.7. HB 11.1/HCT 35.6, microcytic indicis, nitrates 212. Total cholesterol 158, triglycerides 47, HDL 72, LDL 77. TSH normal. Vitamin D 40.6. Microcytic anemia (D50.9)   Allergies (Kimberly Villegas; 2016-08-20 3:49 PM) No Known Drug Allergies [05/17/2015]:  Family History Kimberly Villegas; Aug 20, 2016 3:49 PM) Mother  Deceased. at age 33, from CHF Father  Deceased. at age 3, from CHF Brother 2  younger  Social History (Kimberly Villegas; August 20, 2016 3:49 PM) Current tobacco use  Former smoker. smoked in twenties Alcohol Use  Drinks wine. Marital status  Married. Living Situation  Lives with spouse. Number of Children  3.  Past Surgical History Kimberly Villegas; 08-20-2016 4:04 PM) vein stripping [2011]: right leg Knee Replacement, Total [2013]: Right. Foot Surgery [08/01/2016]: Bilateral.  Medication History (Kimberly Villegas; Aug 20, 2016 4:05 PM) Eliquis ('5MG'$  Tablet, 1 Tablet Oral two times  daily, Taken starting 08/01/2016) Active. Flecainide Acetate ('50MG'$  Tablet, 1 (one) Tablet Oral two times daily, Taken starting 08/01/2016) Active. Atorvastatin Calcium ('40MG'$  Tablet, 1 (one) Tablet Tablet Oral daily, Taken starting 07/29/2016) Active. Metoprolol Succinate ER ('50MG'$  Tablet ER 24HR, 1 (one) Tablet Tablet Oral daily, Taken starting 07/24/2016) Active. Losartan Potassium ('50MG'$  Tablet, 1 Tablet Tablet Tablet Oral daily, Taken starting 04/23/2016) Active. Cephalexin ('500MG'$  Capsule, 1 Oral before dental appt.) Active. Zaleplon ('5MG'$  Capsule, 1 Oral as needed) Active. PreserVision AREDS (1 Oral two times daily) Active. Multiple Vitamin (1 (one) Oral daily) Active. New [place] Bone Strength  (1 three times daily) Active. Easy Fiber (1 tsp Oral daily) Active. ProAir RespiClick (409 (90 Base)MCG/ACT Aero Pow Br Act, Inhalation as needed) Active. Dulera (100-5MCG/ACT Aerosol, Inhalation as needed) Active. Medications Reconciled (List present)  Diagnostic Studies History Kimberly Villegas; August 20, 2016 4:04 PM) Echocardiogram [08/15/2016]: Left ventricle cavity is normal in size. Mild to moderate decrease in LV systolic function with global hypokinesis. Unable to evaluate diastolic function due to A. Fibrillation. Visual EF 40-45% Left atrial cavity is moderately dilated by volume. Measured 3.7 cm Right atrial cavity is mildly dilated. Right ventricle cavity is mild to moderately dilated. Normal right ventricular function. Mild to moderate aortic regurgitation. Mild to moderate mitral regurgitation. Mild to moderate tricuspid regurgitation. Mild pulmonary hypertension. Pulmonary artery systolic pressure is estimated at 36 mm Hg. Compared to 05/30/15, A. Fib new. EF decreased from normal. MR and TR slightly worse and no change in PHT. Subclavian Artery Duplex [07/18/2016]: Left subclavian artery is abnormal, monophasic, suggestive of proximal obstruction. Left vertebral artery  waveform shows high resistance. Antegrade right vertebral artery flow. Blood Pressure Right: 155/80 Left: 135/75 No hemodynamically significant arterial disease in the common carotid artery bilaterally. Compared to the study done on 05/30/2015, left subclavian artery monophasic waveform now well demonstrated. Otherwise no significant change. Nuclear stress test [05/22/2015]:  1. The resting electrocardiogram demonstrated normal sinus rhythm, normal resting conduction, no resting arrhythmias and normal rest repolarization. Stress EKG is non-diagnostic for ischemia as it a pharmacologic stress using Lexiscan. Stress symptoms included burning in chest. There were occasional PACs during stress testing. 2. Myocardial perfusion imaging is normal. Overall left ventricular systolic function was normal without regional wall motion abnormalities. The left ventricular ejection fraction was 68%. Lower Extremity U/S [05/24/2015]: No hemodynamically significant stenoses are identified in bilateral lower extremity arterial system. Mild plaque noted. This exam reveals normal perfusion of both the lower extremities with RABI >1.14 and LABI 1.07. Abdominal Ultrasound [05/24/2015]: Diffuse mixed plaque noted in the entire aorta. An abdominal aortic aneurysm measuring 2.92 x 2.85 x 2.9 cm is seen. Enlarged inferior vena cava suggests elevated right heart pressure. Recheck in 6 months.    Review of Systems Kimberly Burly [name] MD; 08/19/2016 7:32 PM)  Note: GENERAL- Feels tired, No fever, chills. CARDIO VASCULAR- No chest pain. No shortness of breath, orthopnea or PND. No palpitation, dizziness, fainting. Has hypertension and h/o high cholesterol. No swelling on legs. No claudication in legs, No cramps. Had venous thrombosis in the leg approximately 6 years ago following varicose vein stripping. No h/o pulmonary embolism. PULMONARY- No cough, phlegm, wheezing, not feeling congested in chest. GASTROINTESTINAL- No  abdominal pain, nausea, vomiting or diarrhea. No dark tarry stools. Normal appetite. No heartburn. ENDOCRINE- No Thyroid problem, No feeling of excessive heat or cold, No polydipsia or polyuria. No Diabetes. NEUROLOGICAL- No focal motor or sensory symptoms, Good coordination. No seizures. MUSCULOSKELETAL- No generalized myalgias or muscle weakness. No joint swelling SKIN- No skin rash, No pruritus HEMATOLOGY- Has mild macrocytic anemia, No petechiae, excessive bruising, epistaxis, GI bleed or any abnormal bleeding.   Vitals Kimberly Burly [name] MD; 08/19/2016 7:28 PM) 08/19/2016 4:01 PM Weight: 137.06 lb Height: 65in Body Surface Area: 1.68 m Body Mass Index: 22.81 kg/m  Pulse: 80 (Irregular)  P.OX: 99% (Room air) BP: 110/66 (Sitting, Left Arm, Standard)       Physical Exam Kimberly Burly [name] MD; 08/19/2016 7:31 PM) The physical exam findings are as follows: Note:GENERAL APPEARANCE- Alert, Oriented. Well built, nourished HEENT- Unremarkable, fundi were not examined. NECK- No JVD. Carotid pulses are 2+, Bilateral carotid bruits are audible. Bruit is also audible on the left subclavian artery. No thyromegaly. No lymphadenopathy. HEART- Auscultation- Irregular rhythm. Variable S1, S2. No gallops. Grade 3/6 ESM is audible in the aortic and left parasternal area. Grade 2/6 midsystolic murmur is audible at the apex. CHEST- Normal shape. Normal percussion. Auscultation- Normal breath sounds, No crepitations. No wheezing. ABDOMEN- Palpation- Soft, Nontender. No hepatosplenomegaly. No masses felt. Aortic pulsations are palpable above the umbilicus. Auscultation- Normal bowel sounds. Bruit is audible in the epigastric area. EXTREMITIES- No Clubbing or Cyanosis. No edema on legs or feet. PERIPHERAL PULSES- Both femoral pulses- 2+, No bruits audible. Right dorsalis pedis pulse is 2+, left dorsalis pedis is not palpable. Both posterior tibial pulses- 2+    Assessment & Plan Kimberly Burly K.  Vyas MD; 08/19/2016 7:27 PM) New onset atrial fibrillation (I48.91) Story: CHADS2VASC: 5. Risk Category: high risk. Yearly risk of stroke: 6.7% Echocardiogram 08/15/2016: Left ventricle cavity is normal in size. Mild to moderate decrease in LV systolic function with global hypokinesis. Unable to evaluate diastolic function due to A. Fibrillation. Visual EF 40-45% Left atrial cavity is moderately dilated by volume. Measured 3.7 cm Right atrial cavity is mildly dilated. Right ventricle cavity is mild to moderately dilated. Normal right ventricular  function. Mild to moderate aortic regurgitation. Mild to moderate mitral regurgitation. Mild to moderate tricuspid regurgitation. Mild pulmonary hypertension. Pulmonary artery systolic pressure is estimated at 36 mm Hg. Compared to 05/30/15, A. Fib new. EF decreased from normal. MR and TR slightly worse and no change in PHT. Current Plans Discontinued Flecainide Acetate 50MG. Started Amiodarone HCl 200MG, 1 (one) Tablet daily, #30, 30 days starting 08/19/2016, No Refill, Mail Order #90, 90 days, Ref. x1. Complete electrocardiogram (93000) Mitral and aortic regurgitation (I08.0) Story: Mild to moderate by echo. on 08/15/2016: Labwork Story: Labs 04/18/2016: Serum glucose 101 mg, BUN 18, creatinine 0.8, eGFR 69 mL, potassium 4.7. HB 11.1/HCT 35.6, macrocytic indices, platelets- 212. Total cholesterol 158, triglycerides 47, HDL 72, LDL 77. TSH normal. Vitamin D 40.6. Benign Essential Hypertension (I10) Subclavian artery stenosis, left (I77.1) Story: Subclavian artery duplex 07/18/2016: Left subclavian artery is abnormal, monophasic, suggestive of proximal obstruction. Left vertebral artery waveform shows high resistance. Antegrade right vertebral artery flow. Blood Pressure Right: 155/80 Left: 135/75 No hemodynamically significant arterial disease in the common carotid artery bilaterally. Compared to the study done on 05/30/2015, left subclavian artery  monophasic waveform now well demonstrated. Otherwise no significant change.  Note:EKG-atrial fibrillation with moderate ventricular response, rightward axis, nonspecific ST abnormality.  Echocardiogram findings were explained to the patient and her husband. Ventricular response with atrial fibrillation is controlled. I have advised to stop flecainide. She will start amiodarone 200 mg daily after 2 days. Continue other medications including Eliquis. Bleeding complications of Eliquis were again explained. She was also advised to avoid taking aspirin, ibuprofen, Naprosyn or other NSAIDs with Eliquis. Patient verbalized understanding.  I have recommended cardioversion. It is likely that her feeling of tiredness is related to atrial fibrillation. Indications, procedure and possible complications of cardioversion were explained to the patient and her husband. Patient is agreeable, cardioversion will be scheduled to be done by Dr. Einar Gip.  We will continue to observe left subclavian artery stenosis, as she remains asymptomatic.  Blood pressure is well controlled, continue present medications for blood pressure and cholesterol.  Secondary prevention was again discussed. She was advised to follow low-salt, low-cholesterol diet. I have also advised her to start walking regularly as tolerated.  I will see her in follow-up after the cardioversion.  CC: Dr. Berneta Sages.  Signed electronically by Despina Hick, MD (08/19/2016 7:34 PM)

## 2016-09-10 ENCOUNTER — Ambulatory Visit (HOSPITAL_COMMUNITY): Payer: Medicare HMO | Admitting: Anesthesiology

## 2016-09-10 ENCOUNTER — Ambulatory Visit (HOSPITAL_COMMUNITY)
Admission: RE | Admit: 2016-09-10 | Discharge: 2016-09-10 | Disposition: A | Discharge status: Home / Self Care | Payer: Medicare HMO | Source: Ambulatory Visit | Attending: Cardiology | Admitting: Cardiology

## 2016-09-10 ENCOUNTER — Encounter (HOSPITAL_COMMUNITY): Payer: Self-pay | Admitting: *Deleted

## 2016-09-10 ENCOUNTER — Encounter (HOSPITAL_COMMUNITY)
Admission: RE | Disposition: A | Discharge status: Home / Self Care | Payer: Self-pay | Source: Ambulatory Visit | Attending: Cardiology

## 2016-09-10 DIAGNOSIS — Z79899 Other long term (current) drug therapy: Secondary | ICD-10-CM | POA: Insufficient documentation

## 2016-09-10 DIAGNOSIS — Z87891 Personal history of nicotine dependence: Secondary | ICD-10-CM | POA: Diagnosis not present

## 2016-09-10 DIAGNOSIS — Z89422 Acquired absence of other left toe(s): Secondary | ICD-10-CM | POA: Insufficient documentation

## 2016-09-10 DIAGNOSIS — R0989 Other specified symptoms and signs involving the circulatory and respiratory systems: Secondary | ICD-10-CM | POA: Insufficient documentation

## 2016-09-10 DIAGNOSIS — I10 Essential (primary) hypertension: Secondary | ICD-10-CM | POA: Insufficient documentation

## 2016-09-10 DIAGNOSIS — I4891 Unspecified atrial fibrillation: Secondary | ICD-10-CM | POA: Insufficient documentation

## 2016-09-10 DIAGNOSIS — I252 Old myocardial infarction: Secondary | ICD-10-CM | POA: Insufficient documentation

## 2016-09-10 DIAGNOSIS — I771 Stricture of artery: Secondary | ICD-10-CM | POA: Diagnosis not present

## 2016-09-10 DIAGNOSIS — Z7951 Long term (current) use of inhaled steroids: Secondary | ICD-10-CM | POA: Insufficient documentation

## 2016-09-10 DIAGNOSIS — Z89421 Acquired absence of other right toe(s): Secondary | ICD-10-CM | POA: Diagnosis not present

## 2016-09-10 DIAGNOSIS — Z7901 Long term (current) use of anticoagulants: Secondary | ICD-10-CM | POA: Insufficient documentation

## 2016-09-10 DIAGNOSIS — Z96651 Presence of right artificial knee joint: Secondary | ICD-10-CM | POA: Diagnosis not present

## 2016-09-10 DIAGNOSIS — I739 Peripheral vascular disease, unspecified: Secondary | ICD-10-CM | POA: Insufficient documentation

## 2016-09-10 DIAGNOSIS — Z9889 Other specified postprocedural states: Secondary | ICD-10-CM | POA: Diagnosis not present

## 2016-09-10 DIAGNOSIS — E7800 Pure hypercholesterolemia, unspecified: Secondary | ICD-10-CM | POA: Insufficient documentation

## 2016-09-10 DIAGNOSIS — Z86718 Personal history of other venous thrombosis and embolism: Secondary | ICD-10-CM | POA: Insufficient documentation

## 2016-09-10 DIAGNOSIS — I080 Rheumatic disorders of both mitral and aortic valves: Secondary | ICD-10-CM | POA: Insufficient documentation

## 2016-09-10 DIAGNOSIS — I714 Abdominal aortic aneurysm, without rupture: Secondary | ICD-10-CM | POA: Insufficient documentation

## 2016-09-10 HISTORY — PX: CARDIOVERSION: SHX1299

## 2016-09-10 SURGERY — CARDIOVERSION
Anesthesia: General

## 2016-09-10 MED ORDER — SODIUM CHLORIDE 0.9 % IV SOLN
INTRAVENOUS | Status: DC | PRN
  Administered 2016-09-10: 13:00:00 via INTRAVENOUS

## 2016-09-10 MED ORDER — SODIUM CHLORIDE 0.9 % IV SOLN
INTRAVENOUS | Status: DC
  Administered 2016-09-10: 12:00:00 via INTRAVENOUS

## 2016-09-10 MED ORDER — LIDOCAINE 2% (20 MG/ML) 5 ML SYRINGE
INTRAMUSCULAR | Status: DC | PRN
  Administered 2016-09-10: 100 mg via INTRAVENOUS

## 2016-09-10 MED ORDER — PROPOFOL 10 MG/ML IV BOLUS
INTRAVENOUS | Status: DC | PRN
  Administered 2016-09-10: 50 mg via INTRAVENOUS

## 2016-09-10 NOTE — Anesthesia Preprocedure Evaluation (Signed)
Anesthesia Evaluation  Patient identified by MRN, date of birth, ID band Patient awake    Reviewed: Allergy & Precautions, H&P , NPO status , Patient's Chart, lab work & pertinent test results, reviewed documented beta blocker date and time   Airway Mallampati: II  TM Distance: >3 FB Neck ROM: Full    Dental no notable dental hx.    Pulmonary neg pulmonary ROS,    Pulmonary exam normal breath sounds clear to auscultation       Cardiovascular hypertension, negative cardio ROS Normal cardiovascular exam+ dysrhythmias Atrial Fibrillation  Rhythm:Regular Rate:Normal     Neuro/Psych negative neurological ROS  negative psych ROS   GI/Hepatic negative GI ROS, Neg liver ROS,   Endo/Other  negative endocrine ROS  Renal/GU negative Renal ROS  negative genitourinary   Musculoskeletal negative musculoskeletal ROS (+) Arthritis ,   Abdominal   Peds negative pediatric ROS (+)  Hematology negative hematology ROS (+)   Anesthesia Other Findings   Reproductive/Obstetrics negative OB ROS                             Anesthesia Physical  Anesthesia Plan  ASA: III  Anesthesia Plan: General   Post-op Pain Management:    Induction: Intravenous  Airway Management Planned: Mask  Additional Equipment:   Intra-op Plan:   Post-operative Plan:   Informed Consent: I have reviewed the patients History and Physical, chart, labs and discussed the procedure including the risks, benefits and alternatives for the proposed anesthesia with the patient or authorized representative who has indicated his/her understanding and acceptance.   Dental advisory given  Plan Discussed with: CRNA  Anesthesia Plan Comments:         Anesthesia Quick Evaluation

## 2016-09-10 NOTE — Discharge Instructions (Signed)
Electrical Cardioversion, Care After °This sheet gives you information about how to care for yourself after your procedure. Your health care provider may also give you more specific instructions. If you have problems or questions, contact your health care provider. °What can I expect after the procedure? °After the procedure, it is common to have: °· Some redness on the skin where the shocks were given. °Follow these instructions at home: °· Do not drive for 24 hours if you were given a medicine to help you relax (sedative). °· Take over-the-counter and prescription medicines only as told by your health care provider. °· Ask your health care provider how to check your pulse. Check it often. °· Rest for 48 hours after the procedure or as told by your health care provider. °· Avoid or limit your caffeine use as told by your health care provider. °Contact a health care provider if: °· You feel like your heart is beating too quickly or your pulse is not regular. °· You have a serious muscle cramp that does not go away. °Get help right away if: °· You have discomfort in your chest. °· You are dizzy or you feel faint. °· You have trouble breathing or you are short of breath. °· Your speech is slurred. °· You have trouble moving an arm or leg on one side of your body. °· Your fingers or toes turn cold or blue. °This information is not intended to replace advice given to you by your health care provider. Make sure you discuss any questions you have with your health care provider. °Document Released: 02/10/2013 Document Revised: 11/24/2015 Document Reviewed: 10/27/2015 °Elsevier Interactive Patient Education © 2017 [employer] ° °

## 2016-09-10 NOTE — Transfer of Care (Signed)
Immediate Anesthesia Transfer of Care Note  Patient: Kimberly Villegas  Procedure(s) Performed: Procedure(s): CARDIOVERSION (N/A)  Patient Location: Endoscopy Unit  Anesthesia Type:General  Level of Consciousness: sedated and patient cooperative  Airway & Oxygen Therapy: Patient Spontanous Breathing  Post-op Assessment: Report given to RN and Post -op Vital signs reviewed and stable  Post vital signs: Reviewed and stable  Last Vitals:  Vitals:   09/10/16 1149  BP: (!) 147/61  Pulse: 86  Resp: 15  Temp: 36.8 C    Last Pain:  Vitals:   09/10/16 1149  TempSrc: Oral         Complications: No apparent anesthesia complications

## 2016-09-10 NOTE — Anesthesia Postprocedure Evaluation (Signed)
Anesthesia Post Note  Patient: NYSA SARIN  Procedure(s) Performed: Procedure(s) (LRB): CARDIOVERSION (N/A)  Patient location during evaluation: PACU Anesthesia Type: General Level of consciousness: awake and alert Pain management: pain level controlled Vital Signs Assessment: post-procedure vital signs reviewed and stable Respiratory status: spontaneous breathing, nonlabored ventilation, respiratory function stable and patient connected to nasal cannula oxygen Cardiovascular status: blood pressure returned to baseline and stable Postop Assessment: no signs of nausea or vomiting Anesthetic complications: no       Last Vitals:  Vitals:   09/10/16 1350 09/10/16 1400  BP: (!) 127/56 (!) 119/56  Pulse: 67 66  Resp: 15 14  Temp:      Last Pain:  Vitals:   09/10/16 1332  TempSrc: Oral                 Krrish Freund EDWARD

## 2016-09-10 NOTE — CV Procedure (Signed)
Direct current cardioversion:  Indication symptomatic A. Fibrillation.  Procedure: Using 50 mg of IV Propofol and 100 IV Lidocaine (for reducing venous pain) for achieving deep sedation, synchronized direct current cardioversion performed. Patient was delivered with 100 Joules of electricity X 1 with success to NSR. Patient tolerated the procedure well. No immediate complication noted.

## 2016-09-10 NOTE — Interval H&P Note (Signed)
History and Physical Interval Note:  09/10/2016 1:21 PM  Kimberly Villegas  has presented today for surgery, with the diagnosis of AFIB  The various methods of treatment have been discussed with the patient and family. After consideration of risks, benefits and other options for treatment, the patient has consented to  Procedure(s): CARDIOVERSION (N/A) as a surgical intervention .  The patient's history has been reviewed, patient examined, no change in status, stable for surgery.  I have reviewed the patient's chart and labs.  Questions were answered to the patient's satisfaction.     Adrian Prows

## 2016-09-10 NOTE — Anesthesia Procedure Notes (Signed)
Procedure Name: MAC Date/Time: 09/10/2016 1:20 PM Performed by: Garrison Columbus T Pre-anesthesia Checklist: Patient identified, Emergency Drugs available, Suction available and Patient being monitored Patient Re-evaluated:Patient Re-evaluated prior to inductionOxygen Delivery Method: Ambu bag Preoxygenation: Pre-oxygenation with 100% oxygen Intubation Type: IV induction Placement Confirmation: positive ETCO2 and breath sounds checked- equal and bilateral Dental Injury: Teeth and Oropharynx as per pre-operative assessment

## 2016-09-11 ENCOUNTER — Encounter (HOSPITAL_COMMUNITY): Payer: Self-pay | Admitting: Cardiology

## 2016-09-12 DIAGNOSIS — S29012A Strain of muscle and tendon of back wall of thorax, initial encounter: Secondary | ICD-10-CM | POA: Diagnosis not present

## 2016-09-12 DIAGNOSIS — M47817 Spondylosis without myelopathy or radiculopathy, lumbosacral region: Secondary | ICD-10-CM | POA: Diagnosis not present

## 2016-09-12 DIAGNOSIS — M9903 Segmental and somatic dysfunction of lumbar region: Secondary | ICD-10-CM | POA: Diagnosis not present

## 2016-09-12 DIAGNOSIS — M9905 Segmental and somatic dysfunction of pelvic region: Secondary | ICD-10-CM | POA: Diagnosis not present

## 2016-09-12 DIAGNOSIS — M5137 Other intervertebral disc degeneration, lumbosacral region: Secondary | ICD-10-CM | POA: Diagnosis not present

## 2016-09-12 DIAGNOSIS — M9902 Segmental and somatic dysfunction of thoracic region: Secondary | ICD-10-CM | POA: Diagnosis not present

## 2016-09-17 DIAGNOSIS — Z8679 Personal history of other diseases of the circulatory system: Secondary | ICD-10-CM | POA: Diagnosis not present

## 2016-09-17 DIAGNOSIS — I10 Essential (primary) hypertension: Secondary | ICD-10-CM | POA: Diagnosis not present

## 2016-09-17 DIAGNOSIS — I771 Stricture of artery: Secondary | ICD-10-CM | POA: Diagnosis not present

## 2016-09-17 DIAGNOSIS — I080 Rheumatic disorders of both mitral and aortic valves: Secondary | ICD-10-CM | POA: Diagnosis not present

## 2016-10-14 DIAGNOSIS — Z1389 Encounter for screening for other disorder: Secondary | ICD-10-CM | POA: Diagnosis not present

## 2016-10-14 DIAGNOSIS — M47817 Spondylosis without myelopathy or radiculopathy, lumbosacral region: Secondary | ICD-10-CM | POA: Diagnosis not present

## 2016-10-14 DIAGNOSIS — B001 Herpesviral vesicular dermatitis: Secondary | ICD-10-CM | POA: Diagnosis not present

## 2016-10-14 DIAGNOSIS — M9903 Segmental and somatic dysfunction of lumbar region: Secondary | ICD-10-CM | POA: Diagnosis not present

## 2016-10-14 DIAGNOSIS — I2510 Atherosclerotic heart disease of native coronary artery without angina pectoris: Secondary | ICD-10-CM | POA: Diagnosis not present

## 2016-10-14 DIAGNOSIS — D638 Anemia in other chronic diseases classified elsewhere: Secondary | ICD-10-CM | POA: Diagnosis not present

## 2016-10-14 DIAGNOSIS — Z6823 Body mass index (BMI) 23.0-23.9, adult: Secondary | ICD-10-CM | POA: Diagnosis not present

## 2016-10-14 DIAGNOSIS — E784 Other hyperlipidemia: Secondary | ICD-10-CM | POA: Diagnosis not present

## 2016-10-14 DIAGNOSIS — M5137 Other intervertebral disc degeneration, lumbosacral region: Secondary | ICD-10-CM | POA: Diagnosis not present

## 2016-10-14 DIAGNOSIS — M9905 Segmental and somatic dysfunction of pelvic region: Secondary | ICD-10-CM | POA: Diagnosis not present

## 2016-10-14 DIAGNOSIS — G4700 Insomnia, unspecified: Secondary | ICD-10-CM | POA: Diagnosis not present

## 2016-10-14 DIAGNOSIS — S29012A Strain of muscle and tendon of back wall of thorax, initial encounter: Secondary | ICD-10-CM | POA: Diagnosis not present

## 2016-10-14 DIAGNOSIS — M19079 Primary osteoarthritis, unspecified ankle and foot: Secondary | ICD-10-CM | POA: Diagnosis not present

## 2016-10-14 DIAGNOSIS — I480 Paroxysmal atrial fibrillation: Secondary | ICD-10-CM | POA: Diagnosis not present

## 2016-10-14 DIAGNOSIS — M9902 Segmental and somatic dysfunction of thoracic region: Secondary | ICD-10-CM | POA: Diagnosis not present

## 2016-10-14 DIAGNOSIS — I10 Essential (primary) hypertension: Secondary | ICD-10-CM | POA: Diagnosis not present

## 2016-11-11 DIAGNOSIS — M9905 Segmental and somatic dysfunction of pelvic region: Secondary | ICD-10-CM | POA: Diagnosis not present

## 2016-11-11 DIAGNOSIS — M5137 Other intervertebral disc degeneration, lumbosacral region: Secondary | ICD-10-CM | POA: Diagnosis not present

## 2016-11-11 DIAGNOSIS — M9903 Segmental and somatic dysfunction of lumbar region: Secondary | ICD-10-CM | POA: Diagnosis not present

## 2016-11-11 DIAGNOSIS — M47817 Spondylosis without myelopathy or radiculopathy, lumbosacral region: Secondary | ICD-10-CM | POA: Diagnosis not present

## 2016-11-11 DIAGNOSIS — S29012A Strain of muscle and tendon of back wall of thorax, initial encounter: Secondary | ICD-10-CM | POA: Diagnosis not present

## 2016-11-11 DIAGNOSIS — M9902 Segmental and somatic dysfunction of thoracic region: Secondary | ICD-10-CM | POA: Diagnosis not present

## 2016-11-15 NOTE — Anesthesia Postprocedure Evaluation (Signed)
Anesthesia Post Note  Patient: Kimberly Villegas  Procedure(s) Performed: Procedure(s) (LRB): CARDIOVERSION (N/A)     Anesthesia Post Evaluation  Last Vitals:  Vitals:   09/10/16 1350 09/10/16 1400  BP: (!) 127/56 (!) 119/56  Pulse: 67 66  Resp: 15 14  Temp:      Last Pain:  Vitals:   09/10/16 1332  TempSrc: Oral                 Adoni Greenough EDWARD

## 2016-11-15 NOTE — Addendum Note (Signed)
Addendum  created 11/15/16 1239 by Lyndle Herrlich, MD   Sign clinical note

## 2016-11-18 DIAGNOSIS — Z85828 Personal history of other malignant neoplasm of skin: Secondary | ICD-10-CM | POA: Diagnosis not present

## 2016-11-18 DIAGNOSIS — D225 Melanocytic nevi of trunk: Secondary | ICD-10-CM | POA: Diagnosis not present

## 2016-11-18 DIAGNOSIS — Z08 Encounter for follow-up examination after completed treatment for malignant neoplasm: Secondary | ICD-10-CM | POA: Diagnosis not present

## 2016-11-18 DIAGNOSIS — R208 Other disturbances of skin sensation: Secondary | ICD-10-CM | POA: Diagnosis not present

## 2016-11-18 DIAGNOSIS — Z1283 Encounter for screening for malignant neoplasm of skin: Secondary | ICD-10-CM | POA: Diagnosis not present

## 2016-12-06 DIAGNOSIS — Z8679 Personal history of other diseases of the circulatory system: Secondary | ICD-10-CM | POA: Diagnosis not present

## 2016-12-06 DIAGNOSIS — I771 Stricture of artery: Secondary | ICD-10-CM | POA: Diagnosis not present

## 2016-12-06 DIAGNOSIS — I10 Essential (primary) hypertension: Secondary | ICD-10-CM | POA: Diagnosis not present

## 2016-12-06 DIAGNOSIS — I080 Rheumatic disorders of both mitral and aortic valves: Secondary | ICD-10-CM | POA: Diagnosis not present

## 2016-12-09 DIAGNOSIS — E7800 Pure hypercholesterolemia, unspecified: Secondary | ICD-10-CM | POA: Diagnosis not present

## 2016-12-09 DIAGNOSIS — I10 Essential (primary) hypertension: Secondary | ICD-10-CM | POA: Diagnosis not present

## 2017-01-13 DIAGNOSIS — R609 Edema, unspecified: Secondary | ICD-10-CM | POA: Diagnosis not present

## 2017-01-13 DIAGNOSIS — J45998 Other asthma: Secondary | ICD-10-CM | POA: Diagnosis not present

## 2017-01-13 DIAGNOSIS — J988 Other specified respiratory disorders: Secondary | ICD-10-CM | POA: Diagnosis not present

## 2017-01-30 DIAGNOSIS — R609 Edema, unspecified: Secondary | ICD-10-CM | POA: Diagnosis not present

## 2017-02-12 DIAGNOSIS — R55 Syncope and collapse: Secondary | ICD-10-CM | POA: Diagnosis not present

## 2017-02-12 DIAGNOSIS — R0989 Other specified symptoms and signs involving the circulatory and respiratory systems: Secondary | ICD-10-CM | POA: Diagnosis not present

## 2017-02-12 DIAGNOSIS — I771 Stricture of artery: Secondary | ICD-10-CM | POA: Diagnosis not present

## 2017-02-12 DIAGNOSIS — I10 Essential (primary) hypertension: Secondary | ICD-10-CM | POA: Diagnosis not present

## 2017-02-17 DIAGNOSIS — Z6823 Body mass index (BMI) 23.0-23.9, adult: Secondary | ICD-10-CM | POA: Diagnosis not present

## 2017-02-17 DIAGNOSIS — I480 Paroxysmal atrial fibrillation: Secondary | ICD-10-CM | POA: Diagnosis not present

## 2017-02-17 DIAGNOSIS — I6529 Occlusion and stenosis of unspecified carotid artery: Secondary | ICD-10-CM | POA: Diagnosis not present

## 2017-02-17 DIAGNOSIS — I10 Essential (primary) hypertension: Secondary | ICD-10-CM | POA: Diagnosis not present

## 2017-02-17 DIAGNOSIS — R55 Syncope and collapse: Secondary | ICD-10-CM | POA: Diagnosis not present

## 2017-02-17 DIAGNOSIS — R05 Cough: Secondary | ICD-10-CM | POA: Diagnosis not present

## 2017-02-17 DIAGNOSIS — Z23 Encounter for immunization: Secondary | ICD-10-CM | POA: Diagnosis not present

## 2017-02-17 DIAGNOSIS — G4709 Other insomnia: Secondary | ICD-10-CM | POA: Diagnosis not present

## 2017-02-28 ENCOUNTER — Telehealth: Payer: Self-pay | Admitting: Internal Medicine

## 2017-02-28 NOTE — Telephone Encounter (Signed)
02-27-17 pt called to confirm appt for [facility]. It had been cancelled and she didn't know why. Dr. Danna Hefty office called 10-16 and made appt , then called 10-17 and cancelled stating she doesn't need it due to her already being established with [facility], and they had not been aware at the time. Unable to reach, no voicemail to let pt know.

## 2017-03-03 DIAGNOSIS — I472 Ventricular tachycardia: Secondary | ICD-10-CM | POA: Diagnosis not present

## 2017-03-03 DIAGNOSIS — R55 Syncope and collapse: Secondary | ICD-10-CM | POA: Diagnosis not present

## 2017-03-05 ENCOUNTER — Institutional Professional Consult (permissible substitution): Payer: Medicare HMO | Admitting: Internal Medicine

## 2017-03-13 DIAGNOSIS — R55 Syncope and collapse: Secondary | ICD-10-CM | POA: Diagnosis not present

## 2017-03-25 DIAGNOSIS — I472 Ventricular tachycardia: Secondary | ICD-10-CM | POA: Diagnosis not present

## 2017-03-25 DIAGNOSIS — R55 Syncope and collapse: Secondary | ICD-10-CM | POA: Diagnosis not present

## 2017-03-25 DIAGNOSIS — R0989 Other specified symptoms and signs involving the circulatory and respiratory systems: Secondary | ICD-10-CM | POA: Diagnosis not present

## 2017-03-31 ENCOUNTER — Ambulatory Visit: Payer: Medicare HMO | Admitting: Internal Medicine

## 2017-03-31 ENCOUNTER — Encounter (INDEPENDENT_AMBULATORY_CARE_PROVIDER_SITE_OTHER): Payer: Self-pay

## 2017-03-31 ENCOUNTER — Encounter: Payer: Self-pay | Admitting: Internal Medicine

## 2017-03-31 VITALS — BP 144/74 | HR 65 | Ht 65.0 in | Wt 142.2 lb

## 2017-03-31 DIAGNOSIS — I480 Paroxysmal atrial fibrillation: Secondary | ICD-10-CM | POA: Diagnosis not present

## 2017-03-31 DIAGNOSIS — I10 Essential (primary) hypertension: Secondary | ICD-10-CM

## 2017-03-31 NOTE — Progress Notes (Signed)
Electrophysiology Office Note   Date:  03/31/2017   ID:  Monque, Haggar [birth year], MRN [id number]  PCP:  Prince Solian, MD  Cardiologist:  Dr Einar Gip Primary Electrophysiologist: Thompson Grayer, MD    Chief Complaint  Patient presents with  . Atrial Fibrillation     History of Present Illness: Kimberly Villegas is a 82 y.o. female who presents today for electrophysiology evaluation.   She is referred by Dr Dagmar Hait for EP consultation regarding atrial fibrillation.  She reports initially being found to have afib earlier this year without symptoms.  She was evaluated and placed on beta blocker therapy.  She then had several presyncopal events. This occurred in the setting of ankel surgery and subsequently a stressful 2 month trip to [place].  She was subsequently placed on amiodarone.  She appears to have done very well, without any symptoms of arrhythmia since October.  She is doing well at this time.  Today, she denies symptoms of palpitations, chest pain, shortness of breath, orthopnea, PND, lower extremity edema, claudication, further presyncope, bleeding, or neurologic sequela. The patient is tolerating medications without difficulties and is otherwise without complaint today.    Past Medical History:  Diagnosis Date  . A-fib ([place])   . Anemia, chronic disease   . Arthritis   . Asthma   . Bilateral carotid bruits   . CAD (coronary artery disease)   . Cervical stenosis of spine   . Cough   . Diastolic dysfunction   . DVT (deep venous thrombosis) ([place])    after vein stripping years ago  . GERD (gastroesophageal reflux disease)   . Hyperlipemia   . Hypertension   . Insomnia   . New onset a-fib ([place]) 2018  . Seasonal allergies   . Syncope   . Vitamin D deficiency    Past Surgical History:  Procedure Laterality Date  . AMPUTATION TOE Bilateral 07/25/2016   Procedure: bilateral 2nd toe amputations;  Surgeon: Wylene Simmer, MD;  Location: [place];   Service: Orthopedics;  Laterality: Bilateral;  . BASAL CELL CARCINOMA EXCISION    . CARDIOVERSION N/A 09/10/2016   Procedure: CARDIOVERSION;  Surgeon: Adrian Prows, MD;  Location: [place];  Service: Cardiovascular;  Laterality: N/A;  . FOOT SURGERY Left   . JOINT REPLACEMENT Right   . VEIN LIGATION AND STRIPPING Right      Current Outpatient Medications  Medication Sig Dispense Refill  . albuterol (PROVENTIL HFA;VENTOLIN HFA) 108 (90 Base) MCG/ACT inhaler Inhale 2 puffs into the lungs every 4 (four) hours as needed for wheezing or shortness of breath.    Marland Kitchen amiodarone (PACERONE) 100 MG tablet Take 100 mg by mouth daily.    Marland Kitchen apixaban (ELIQUIS) 5 MG TABS tablet Take 5 mg by mouth 2 (two) times daily.    Marland Kitchen atorvastatin (LIPITOR) 40 MG tablet Take 40 mg by mouth daily.    Marland Kitchen BREO ELLIPTA 200-25 MCG/INH AEPB Inhale 1 puff into the lungs daily as needed. Use as directed  0  . Calcium-Vit D-Arg-Inos-Silicon (BONE DENSITY PO) Take 1-2 tablets by mouth See admin instructions. Take 1 tablet by mouth in the morning and take 2 tablets by mouth in the evening    . losartan (COZAAR) 50 MG tablet Take 50 mg by mouth daily.    . Misc Natural Products (FIBER 7) POWD Take 1 scoop by mouth daily.     . Multiple Vitamins-Calcium (ONE-A-DAY WOMENS PO) Take 1 tablet by mouth daily.    Marland Kitchen  Multiple Vitamins-Minerals (PRESERVISION AREDS PO) Take 1 capsule by mouth 2 (two) times daily.     . TOPROL XL 50 MG 24 hr tablet Take 50 mg by mouth daily.    . zaleplon (SONATA) 5 MG capsule Take 5 mg by mouth at bedtime as needed for sleep.     No current facility-administered medications for this visit.     Allergies:   Patient has no known allergies.   Social History:  The patient  reports that  has never smoked. she has never used smokeless tobacco. She reports that she drinks alcohol. She reports that she does not use drugs.   Family History:  The patient's  family history includes Colon cancer in her father;  Healthy in her child, child, child, grandchild, grandchild, grandchild, grandchild, and grandchild; Heart disease in her mother; Stroke in her mother.    ROS:  Please see the history of present illness.   All other systems are personally reviewed and negative.    PHYSICAL EXAM: VS:  BP (!) 144/74   Pulse 65   Ht 5\' 5"  (1.651 m)   Wt 142 lb 3.2 oz (64.5 kg)   SpO2 98%   BMI 23.66 kg/m  , BMI Body mass index is 23.66 kg/m. GEN: Well nourished, well developed, in no acute distress  HEENT: normal  Neck: no JVD, carotid bruits, or masses Cardiac: RRR; no murmurs, rubs, or gallops,no edema  Respiratory:  clear to auscultation bilaterally, normal work of breathing GI: soft, nontender, nondistended, + BS MS: no deformity or atrophy  Skin: warm and dry  Neuro:  Strength and sensation are intact Psych: euthymic mood, full affect  EKG:  EKG is ordered today. The ekg ordered today is personally reviewed and shows sinus rhythm 65 bpm, PR 160 msec, otherwise normal ekg   Recent Labs: 07/27/2016: BUN 18; Creatinine, Ser 0.95; Hemoglobin 11.9; Platelets 182; Potassium 3.9; Sodium 139  personally reviewed   Lipid Panel  No results found for: CHOL, TRIG, HDL, CHOLHDL, VLDL, LDLCALC, LDLDIRECT personally reviewed   Wt Readings from Last 3 Encounters:  03/31/17 142 lb 3.2 oz (64.5 kg)  09/10/16 139 lb (63 kg)  07/27/16 139 lb (63 kg)      Other studies personally reviewed: Additional studies/ records that were reviewed today include: over 20 pages of records are reviewed at length today from Dr Einar Gip and Dr Danna Hefty offices  Review of the above records today demonstrates: as above  Echo 08/15/16 reveals EF 40-45%, moderate LA enlargement, mild to moderate RV enlargement, mild to moderate AI, mild to moderate MR, mild to moderate TR  Bilateral carotid doppler reveals L subclavian stenosis, possibly occluded  Event monitor 6/18 is reveiewed and reveals rare PACs, PVCs, and 4 beat NSVT  without symptoms, no afib,  A single nocturnal pause is also noted.  She reports that she did not have presyncope while wearing the monitor.  Stress test 03/03/17 is reviewed and normal   ASSESSMENT AND PLAN:  1.  Paroxysmal atrial fibrillation Currently well controlled with amiodarone She was minimally symptomatic previously No changes at this time chads2vasc score is at least 5.  Continue long term anticoagulation   2. Presyncope No arrhythmias on 30 day event monitor, though she also did not have any symptomatic episodes. We discussed the possibility of long term monitoring with an implantable loop recorder.  At this time, she would prefer a more conservative strategy.  If her symptoms return, she will reconsider.  3. HTN Stable  No change required today  Follow-up with Dr Einar Gip as scheduled Return to see me in 2-3 months  Current medicines are reviewed at length with the patient today.   The patient does not have concerns regarding her medicines.  The following changes were made today:  none    Signed, Thompson Grayer, MD  03/31/2017 11:55 AM     [facility] [address] [place] [address] [phone] (office) [phone] (fax)

## 2017-03-31 NOTE — Patient Instructions (Addendum)
Medication Instructions:  Your physician recommends that you continue on your current medications as directed. Please refer to the Current Medication list given to you today.   Labwork: None ordered   Testing/Procedures: None ordered   Follow-Up: Your physician recommends that you schedule a follow-up appointment in: 2-3 months with Dr Rayann Heman   Any Other Special Instructions Will Be Listed Below (If Applicable).     If you need a refill on your cardiac medications before your next appointment, please call your pharmacy.

## 2017-04-07 DIAGNOSIS — R55 Syncope and collapse: Secondary | ICD-10-CM | POA: Diagnosis not present

## 2017-04-07 DIAGNOSIS — I771 Stricture of artery: Secondary | ICD-10-CM | POA: Diagnosis not present

## 2017-04-07 DIAGNOSIS — I480 Paroxysmal atrial fibrillation: Secondary | ICD-10-CM | POA: Diagnosis not present

## 2017-04-07 DIAGNOSIS — R0989 Other specified symptoms and signs involving the circulatory and respiratory systems: Secondary | ICD-10-CM | POA: Diagnosis not present

## 2017-04-07 DIAGNOSIS — I472 Ventricular tachycardia: Secondary | ICD-10-CM | POA: Diagnosis not present

## 2017-04-21 DIAGNOSIS — H25813 Combined forms of age-related cataract, bilateral: Secondary | ICD-10-CM | POA: Diagnosis not present

## 2017-04-21 DIAGNOSIS — H5211 Myopia, right eye: Secondary | ICD-10-CM | POA: Diagnosis not present

## 2017-04-21 DIAGNOSIS — H524 Presbyopia: Secondary | ICD-10-CM | POA: Diagnosis not present

## 2017-04-21 DIAGNOSIS — H52221 Regular astigmatism, right eye: Secondary | ICD-10-CM | POA: Diagnosis not present

## 2017-04-21 DIAGNOSIS — H52222 Regular astigmatism, left eye: Secondary | ICD-10-CM | POA: Diagnosis not present

## 2017-04-21 DIAGNOSIS — H353131 Nonexudative age-related macular degeneration, bilateral, early dry stage: Secondary | ICD-10-CM | POA: Diagnosis not present

## 2017-04-21 DIAGNOSIS — H04123 Dry eye syndrome of bilateral lacrimal glands: Secondary | ICD-10-CM | POA: Diagnosis not present

## 2017-05-01 DIAGNOSIS — E559 Vitamin D deficiency, unspecified: Secondary | ICD-10-CM | POA: Diagnosis not present

## 2017-05-01 DIAGNOSIS — E7849 Other hyperlipidemia: Secondary | ICD-10-CM | POA: Diagnosis not present

## 2017-05-01 DIAGNOSIS — R82998 Other abnormal findings in urine: Secondary | ICD-10-CM | POA: Diagnosis not present

## 2017-05-01 DIAGNOSIS — I10 Essential (primary) hypertension: Secondary | ICD-10-CM | POA: Diagnosis not present

## 2017-05-08 DIAGNOSIS — R55 Syncope and collapse: Secondary | ICD-10-CM | POA: Diagnosis not present

## 2017-05-08 DIAGNOSIS — I6529 Occlusion and stenosis of unspecified carotid artery: Secondary | ICD-10-CM | POA: Diagnosis not present

## 2017-05-08 DIAGNOSIS — E7849 Other hyperlipidemia: Secondary | ICD-10-CM | POA: Diagnosis not present

## 2017-05-08 DIAGNOSIS — I10 Essential (primary) hypertension: Secondary | ICD-10-CM | POA: Diagnosis not present

## 2017-05-08 DIAGNOSIS — K219 Gastro-esophageal reflux disease without esophagitis: Secondary | ICD-10-CM | POA: Diagnosis not present

## 2017-05-08 DIAGNOSIS — I2510 Atherosclerotic heart disease of native coronary artery without angina pectoris: Secondary | ICD-10-CM | POA: Diagnosis not present

## 2017-05-08 DIAGNOSIS — J45998 Other asthma: Secondary | ICD-10-CM | POA: Diagnosis not present

## 2017-05-08 DIAGNOSIS — I519 Heart disease, unspecified: Secondary | ICD-10-CM | POA: Diagnosis not present

## 2017-05-08 DIAGNOSIS — I480 Paroxysmal atrial fibrillation: Secondary | ICD-10-CM | POA: Diagnosis not present

## 2017-05-08 DIAGNOSIS — Z0000 Encounter for general adult medical examination without abnormal findings: Secondary | ICD-10-CM | POA: Diagnosis not present

## 2017-05-13 ENCOUNTER — Other Ambulatory Visit: Payer: Self-pay | Admitting: Internal Medicine

## 2017-05-13 DIAGNOSIS — Z1231 Encounter for screening mammogram for malignant neoplasm of breast: Secondary | ICD-10-CM

## 2017-05-19 DIAGNOSIS — H10413 Chronic giant papillary conjunctivitis, bilateral: Secondary | ICD-10-CM | POA: Diagnosis not present

## 2017-05-19 DIAGNOSIS — H04123 Dry eye syndrome of bilateral lacrimal glands: Secondary | ICD-10-CM | POA: Diagnosis not present

## 2017-05-20 DIAGNOSIS — Z1212 Encounter for screening for malignant neoplasm of rectum: Secondary | ICD-10-CM | POA: Diagnosis not present

## 2017-06-02 DIAGNOSIS — Z780 Asymptomatic menopausal state: Secondary | ICD-10-CM | POA: Diagnosis not present

## 2017-06-30 ENCOUNTER — Encounter: Payer: Self-pay | Admitting: Internal Medicine

## 2017-06-30 ENCOUNTER — Ambulatory Visit: Payer: Medicare HMO | Admitting: Internal Medicine

## 2017-06-30 VITALS — BP 130/60 | HR 63 | Ht 65.0 in | Wt 144.0 lb

## 2017-06-30 DIAGNOSIS — I10 Essential (primary) hypertension: Secondary | ICD-10-CM

## 2017-06-30 DIAGNOSIS — I480 Paroxysmal atrial fibrillation: Secondary | ICD-10-CM

## 2017-06-30 MED ORDER — LOSARTAN POTASSIUM 25 MG PO TABS: 25.0000 mg | ORAL_TABLET | Freq: Every day | ORAL | 3 refills | Status: DC

## 2017-06-30 NOTE — Progress Notes (Signed)
PCP: Prince Solian, MD Primary Cardiologist: Dr Einar Gip Primary EP: Dr Rayann Heman  Kimberly Villegas is a 83 y.o. female who presents today for routine electrophysiology followup.  Since last being seen in our clinic, the patient reports doing very well.  Today, she denies symptoms of palpitations, chest pain, shortness of breath,  lower extremity edema,  presyncope, or syncope.  + rare postural dizziness.  BP at home are frequently 94B-09G systolic.  No afib by her apple watch v 4. The patient is otherwise without complaint today.   Past Medical History:  Diagnosis Date  . A-fib ([place])   . Anemia, chronic disease   . Arthritis   . Asthma   . Bilateral carotid bruits   . CAD (coronary artery disease)   . Cervical stenosis of spine   . Cough   . Diastolic dysfunction   . DVT (deep venous thrombosis) ([place])    after vein stripping years ago  . GERD (gastroesophageal reflux disease)   . Hyperlipemia   . Hypertension   . Insomnia   . New onset a-fib ([place]) 2018  . Seasonal allergies   . Syncope   . Vitamin D deficiency    Past Surgical History:  Procedure Laterality Date  . AMPUTATION TOE Bilateral 07/25/2016   Procedure: bilateral 2nd toe amputations;  Surgeon: Wylene Simmer, MD;  Location: [facility];  Service: Orthopedics;  Laterality: Bilateral;  . BASAL CELL CARCINOMA EXCISION    . CARDIOVERSION N/A 09/10/2016   Procedure: CARDIOVERSION;  Surgeon: Adrian Prows, MD;  Location: [facility];  Service: Cardiovascular;  Laterality: N/A;  . FOOT SURGERY Left   . JOINT REPLACEMENT Right   . VEIN LIGATION AND STRIPPING Right     ROS- all systems are reviewed and negatives except as per HPI above  Current Outpatient Medications  Medication Sig Dispense Refill  . albuterol (PROVENTIL HFA;VENTOLIN HFA) 108 (90 Base) MCG/ACT inhaler Inhale 2 puffs into the lungs every 4 (four) hours as needed for wheezing or shortness of breath.    Marland Kitchen amiodarone (PACERONE) 100 MG tablet Take  100 mg by mouth daily.    Marland Kitchen apixaban (ELIQUIS) 5 MG TABS tablet Take 5 mg by mouth 2 (two) times daily.    Marland Kitchen atorvastatin (LIPITOR) 40 MG tablet Take 40 mg by mouth daily.    Marland Kitchen BREO ELLIPTA 200-25 MCG/INH AEPB Inhale 1 puff into the lungs daily as needed. Use as directed  0  . Calcium-Vit D-Arg-Inos-Silicon (BONE DENSITY PO) Take 1-2 tablets by mouth See admin instructions. Take 1 tablet by mouth in the morning and take 2 tablets by mouth in the evening    . losartan (COZAAR) 50 MG tablet Take 50 mg by mouth daily.    . Misc Natural Products (FIBER 7) POWD Take 1 scoop by mouth daily.     . Multiple Vitamins-Calcium (ONE-A-DAY WOMENS PO) Take 1 tablet by mouth daily.    . Multiple Vitamins-Minerals (PRESERVISION AREDS PO) Take 1 capsule by mouth 2 (two) times daily.     . TOPROL XL 50 MG 24 hr tablet Take 50 mg by mouth daily.    . zaleplon (SONATA) 5 MG capsule Take 5 mg by mouth at bedtime as needed for sleep.     No current facility-administered medications for this visit.     Physical Exam: Vitals:   06/30/17 1202  BP: 130/60  Pulse: 63  Weight: 144 lb (65.3 kg)  Height: 5\' 5"  (1.651 m)    GEN-  The patient is well appearing, alert and oriented x 3 today.   Head- normocephalic, atraumatic Eyes-  Sclera clear, conjunctiva pink Ears- hearing intact Oropharynx- clear Lungs- Clear to ausculation bilaterally, normal work of breathing Heart- Regular rate and rhythm, no murmurs, rubs or gallops, PMI not laterally displaced GI- soft, NT, ND, + BS Extremities- no clubbing, cyanosis, or edema  EKG tracing ordered today is personally reviewed and shows sinus rhythm 63 bpm, otherwise normal ekg  Assessment and Plan:  1. Paroxysmal atrail fibrillation Doing very well with amiodarone She has an AppleWatch v 4 with no recent afib detected. chads2vasc score is 5.  Continue anticoagualtion  2. Presyncope Stable Given recent low BPs, will reduce losartan to 25mg  daily  3. HTN Reduce  losartan as above  Follow-up with Dr Einar Gip as scheduled I will see as needed going forward  Thompson Grayer MD, [facility] 06/30/2017 12:32 PM

## 2017-06-30 NOTE — Patient Instructions (Signed)
Medication Instructions:  Your physician has recommended you make the following change in your medication:  1.  Decrease your losartan to 25 mg daily.  You may split your current meds in half until they are gone.  Your new prescription when you fill it will be 25 mg tablets.  Labwork: None ordered.   Testing/Procedures: None ordered.   Follow-Up: Your physician wants you to follow-up with Dr. Rayann Heman as needed.  Any Other Special Instructions Will Be Listed Below (If Applicable).     If you need a refill on your cardiac medications before your next appointment, please call your pharmacy.

## 2017-07-04 ENCOUNTER — Ambulatory Visit
Admission: RE | Admit: 2017-07-04 | Discharge: 2017-07-04 | Disposition: A | Discharge status: Home / Self Care | Payer: Medicare HMO | Source: Ambulatory Visit | Attending: Internal Medicine | Admitting: Internal Medicine

## 2017-07-04 DIAGNOSIS — Z1231 Encounter for screening mammogram for malignant neoplasm of breast: Secondary | ICD-10-CM

## 2017-07-25 DIAGNOSIS — I480 Paroxysmal atrial fibrillation: Secondary | ICD-10-CM | POA: Diagnosis not present

## 2017-07-25 DIAGNOSIS — I771 Stricture of artery: Secondary | ICD-10-CM | POA: Diagnosis not present

## 2017-07-25 DIAGNOSIS — I10 Essential (primary) hypertension: Secondary | ICD-10-CM | POA: Diagnosis not present

## 2017-07-28 NOTE — H&P (Signed)
Kimberly Villegas 2017/08/24 2:00 PM Location: [facility] Patient #: [id number] DOB: August 22, 1934 Married / Language: Kimberly Villegas   History of Present Illness Kimberly Page MD; 07/26/2017 8:22 PM) Patient words: Acute Visit for afib; Last office visit 04/07/17.  The patient is a 83 year old Villegas who presents for a Follow-up for Atrial fibrillation. [name] is a Caucasian Villegas with history of paroxysmal atrial fibrillation and cardioversion in may of 2018, nonischemic cardiomyopathy with LVEF 40-45% which improved to normal after cardioversion in November 2018. She has asymptomatic left subclavian artery stenosis, mild aortic and mitral regurgitation and moderate ulnarly hypertension and a small abdominal aortic aneurysm by ultrasound performed in July 2017. Hypertension, hyperlipidemia, one episode of NSVT by event monitor however negative nuclear stress test in October 2018 when she was evaluated for syncope which she has not had recurrence and felt neurocardiogenic.  She made an appointment to see is today due to her "apple watch" showing irregular heart rhythm and high heart rate and thought that she may be back in atrial fibrillation. She has been evaluated by Dr. Thompson Grayer, EP and was recommended medical therapy and potential consideration for ablation if medical therapy fails. She is accompanied by her husband.   Problem List/Past Medical (April Harrington; 07/26/2017 8:14 PM) Subclavian artery stenosis, left (I77.1)  Subclavian artery duplex 07/18/2016: Left subclavian artery is abnormal, monophasic, suggestive of proximal obstruction. Left vertebral artery waveform shows high resistance. Antegrade right vertebral artery flow. Blood Pressure Right: 155/80 Left: 135/75 No hemodynamically significant arterial disease in the common carotid artery bilaterally. Compared to the study done on 05/30/2015, left subclavian artery monophasic waveform now  well demonstrated. Otherwise no significant change. Bilateral carotid bruits (R09.89)  Carotid artery duplex 03/25/2017: Stenosis in the right internal carotid artery (16-49%). Antegrade right vertebral artery flow. Bidirectional left vertebral artery flow suggests left subclavian artery stenosis. Follow up in one year is appropriate if clinically indicated. Compared to 07/18/2016, right ICA stenosis new. Patient has known subclavian stenosis. Benign Essential Hypertension (I10)  Hypercholesteremia (E78.00)  PAD (peripheral artery disease) (I73.9)  Asymptomatic mild left subclavian artery stenosis Mitral and aortic regurgitation (I08.0)  Mild to moderate by echo. on 03/25/17, No significant change from 08/15/2016: AAA (abdominal aortic aneurysm) without rupture (I71.4)  Abdominal aortic duplex 05/24/2015: Diffuse mixed plaque noted in the entire aorta. An abdominal aortic aneurysm measuring 2.92 x 2.85 x 2.9 cm is seen. Enlarged inferior vena cava suggests elevated right heart pressure. Recheck in 6 months. Labwork  05/08/2017: Creatinine 1.1, EGFR 47/57, potassium 4.7, CMP normal. RBC 3.3, hemoglobin 7.8, hematocrit 34, MCV 103, MCH 35.9, CBC otherwise normal. Cholesterol 171, triglycerides 70, HDL 70, LDL 87. Apolipoprotein B 66. TSH 2.2. Vitamin D 28.7.  12/09/2016: Cholesterol 158, triglycerides 56, HDL 84, LDL 63. Creatinine 0.97, EGFR 56, sodium 4.4, CMP otherwise normal RBC 3.6, normal H&H, MCV 98, CBC otherwise normal.  Labs 04/18/2016: Serum glucose 101 mg, BUN 18, creatinine 0.8, eGFR 69 mL, potassium 4.7. HB 11.1/HCT 35.6, macrocytic indices, platelets- 212. Total cholesterol 158, triglycerides 47, HDL 72, LDL 77. TSH normal. Vitamin D 40.6. Paroxysmal atrial fibrillation (I48.0)  Direct current cardioversion 09/10/2016: A. Fib to NST with 100J x 1 CHADS2VASC: 5. Risk Category: high risk. Yearly risk of stroke: 6.7%  Allergies (April Harrington; 24-Aug-2017 2:24 PM) No Known Drug  Allergies [05/17/2015]:  Family History (April Harrington; 08-24-17 2:24 PM) Mother  Deceased. at age 1, from CHF Father  Deceased. at age  15, from CHF Brother 2  younger  Social History (April Harrington; 07/25/2017 2:24 PM) Current tobacco use  Former smoker. smoked in twenties Alcohol Use  Drinks wine. Marital status  Married. Living Situation  Lives with spouse. Number of Children  3.  Past Surgical History (April Harrington; 07/25/2017 2:24 PM) vein stripping [2011]: right leg Knee Replacement, Total [2013]: Right. Foot Surgery [08/01/2016]: Bilateral.  Medication History (April Harrington; 07/25/2017 2:34 PM) Atorvastatin Calcium ('40MG'$  Tablet, 1 (one) Tablet Tablet Oral daily, Taken starting 02/19/2017) Active. Amiodarone HCl ('100MG'$  Tablet, 1 (one) Tablet Oral daily, Taken starting 02/18/2017) Active. Eliquis ('5MG'$  Tablet, 1 Tablet Oral two times daily, Taken starting 08/01/2016) Active. Metoprolol Succinate ER ('50MG'$  Tablet ER 24HR, 1 (one) Tablet Oral daily, Taken starting 07/24/2016) Active. Losartan Potassium ('50MG'$  Tablet, 1/2 Tablet Oral daily, Taken starting 04/23/2016) Active. Cephalexin ('500MG'$  Capsule, 1 Oral before dental appt.) Active. Zaleplon ('5MG'$  Capsule, 1 Oral as needed) Active. PreserVision AREDS (1 Oral two times daily) Active. Multiple Vitamin (1 (one) Oral daily) Active. [place] Bone Strength  (1 three times daily) Active. Easy Fiber (1 tsp Oral daily) Active. ProAir RespiClick (409 (90 Base)MCG/ACT Aero Pow Br Act, Inhalation as needed) Active. Dulera (100-5MCG/ACT Aerosol, Inhalation as needed) Active. Montelukast Sodium ('10MG'$  Tablet, 1 Oral prn) Active. Pazeo (0.7% Solution, Ophthalmic two times daily) Active. Medications Reconciled (verbally with pt; no list or medication present)    Review of Systems Kimberly Page MD; 07/26/2017 8:12 PM) General Not Present- Appetite Loss and Weight Gain. Respiratory Not Present-  Chronic Cough and Wakes up from Sleep Wheezing or Short of Breath. Gastrointestinal Not Present- Black, Tarry Stool and Difficulty Swallowing. Musculoskeletal Not Present- Decreased Range of Motion and Muscle Atrophy. Neurological Not Present- Attention Deficit. Psychiatric Not Present- Personality Changes and Suicidal Ideation. Endocrine Not Present- Cold Intolerance and Heat Intolerance. Hematology Not Present- Abnormal Bleeding. All other systems negative  Vitals (April Harrington; 07/25/2017 2:36 PM) 07/25/2017 2:31 PM Weight: 144.38 lb Height: 65in Body Surface Area: 1.72 m Body Mass Index: 24.03 kg/m  Pulse: 83 (Regular)  P.OX: 98% (Room air) BP: 122/78 (Sitting, Left Arm, Standard)       Physical Exam Kimberly Page, MD; 07/26/2017 8:8 PM) General Mental Status-Alert. General Appearance-Cooperative and Appears younger than stated age. Build & Nutrition-Moderately built.  Head and Neck Thyroid Gland Characteristics - normal size and consistency and no palpable nodules.  Chest and Lung Exam Chest and lung exam reveals -quiet, even and easy respiratory effort with no use of accessory muscles, non-tender and on auscultation, normal breath sounds, no adventitious sounds.  Cardiovascular Cardiovascular examination reveals -carotid auscultation reveals no bruits and abdominal aorta auscultation reveals no bruits and no prominent pulsation. Auscultation Rhythm - Regular. Heart Sounds - S1 WNL, S2 WNL and Midsystolic click present. Murmurs & Other Heart Sounds: Murmur - Location - Apex. Timing - Late systolic. Grade - II/VI.  Abdomen Palpation/Percussion Normal exam - Non Tender and No hepatosplenomegaly.  Peripheral Vascular Lower Extremity Inspection - Bilateral - Inspection Normal. Palpation - Edema - Bilateral - Trace edema. Femoral pulse - Bilateral - Normal. Popliteal pulse - Bilateral - Normal. Dorsalis pedis pulse - Left - 1+. Right - Normal.  Posterior tibial pulse - Bilateral - Normal. Lower Extremity Palpation - Temperature - Bilateral - Normal. Brachial pulse - Bilateral - Normal. Radial pulse - Bilateral - Normal. Carotid arteries - Left-Harsh Bruit. Carotid arteries - Right-Soft Bruit.  Neurologic Neurologic evaluation reveals -alert and oriented x 3 with no impairment of  recent or remote memory. Motor-Grossly intact without any focal deficits.  Musculoskeletal Global Assessment Left Lower Extremity - no deformities, masses or tenderness, no known fractures. Right Lower Extremity - no deformities, masses or tenderness, no known fractures.    Assessment & Plan Kimberly Page MD; 07/26/2017 8:25 PM) Paroxysmal atrial fibrillation (I48.0) Story: Direct current cardioversion 09/10/2016: A. Fib to NSR with 100J x 1 CHADS2VASC: 5. Risk Category: high risk. Yearly risk of stroke: 6.7% Impression: EKG 07/25/2017: Atypical Atrial flutter with 2:1 AV conduction, ventricular rate 90 bpm. Normal axis. Normal QT interval. No evidence of ischemia.  EKG 04/07/2017:: Normal sinus rhythm at rate of 61 bpm, normal axis. No evidence of ischemia, normal EKG. No significant change from EKG 12/06/2016. Current Plans Complete electrocardiogram (33295) METABOLIC PANEL, BASIC (18841) Subclavian artery stenosis, left (I77.1) Story: Subclavian artery duplex 07/18/2016: Left subclavian artery is abnormal, monophasic, suggestive of proximal obstruction. Left vertebral artery waveform shows high resistance. Antegrade right vertebral artery flow. Blood Pressure Right: 155/80 Left: 135/75 No hemodynamically significant arterial disease in the common carotid artery bilaterally. Compared to the study done on 05/30/2015, left subclavian artery monophasic waveform now well demonstrated. Otherwise no significant change. Benign Essential Hypertension (I10) Labwork Story: Labs 07/25/2017: BUN 22, creatinine 1.11, eGFR 47 mL, potassium  4.2.  Labs 05/01/2017: Total cholesterol 171, triglycerides 70, HDL 70, LDL 87. CMP normal. HB 11.8, platelets 183, microcytic index. Apolipoprotein B 66. TSH 2.2. Vitamin D 28.7. Creatinine 1.1, EGFR 47/57, potassium 4.7, CMP normal.  12/09/2016: Cholesterol 158, triglycerides 56, HDL 84, LDL 63. Creatinine 0.97, EGFR 56, sodium 4.4, CMP otherwise normal RBC 3.6, normal H&H, MCV 98, CBC otherwise normal.  Labs 04/18/2016: Serum glucose 101 mg, BUN 18, creatinine 0.8, eGFR 69 mL, potassium 4.7. HB 11.1/HCT 35.6, macrocytic indices, platelets- 212. Total cholesterol 158, triglycerides 47, HDL 72, LDL 77. TSH normal. Vitamin D 40.6.  Note::-  Recommendations:  Ms. Sadiyah Kangas is a Caucasian Villegas with history of paroxysmal atrial fibrillation and cardioversion in may of 2018, nonischemic cardiomyopathy with LVEF 40-45% which improved to normal after cardioversion in November 2018. She has asymptomatic left subclavian artery stenosis, mild aortic and mitral regurgitation and moderate ulnarly hypertension and a small abdominal aortic aneurysm by ultrasound performed in July 2017. Hypertension, hyperlipidemia, one episode of NSVT by event monitor however negative nuclear stress test in October 2018 when she was evaluated for syncope which she has not had recurrence and felt neurocardiogenic. Subclavian stenosis is not significant clinically.  he made an appointment to see is today due to her "apple watch" showing irregular heart rhythm and high heart rate. She was evaluated by Thompson Grayer on 06/30/2017, who recommended continuing the amiodarone at low dose and also reduce the dose of losartan due to dizziness and low blood pressure.  Today she is in atypical atrial flutter with 2:1 conduction, although asymptomatic, I feel we should proceed with direct current cardioversion to maintain diastolic function. If she goes back into recurrent episodes of atrial fibrillation, then we can consider rate  control strategy only in view of minimal symptoms.  She is presently tolerating anticoagulation well. She is going out of town the next 3 days, I'll try to perform cardioversion on Monday if possible. I'll see her back after she returns from [place]. Husband present. Schedule for Direct current cardioversion. I have discussed regarding risks benefits rate control vs rhythm control with the patient. Patient understands cardiac arrest and need for CPR, aspiration pneumonia, but not limited to these.  Patient is willing. It will be scheduled for in the next This was a greater than 40 minute office visit with greater than 50% of the time spent with face-to-face encounter with patient and evaluation of complex medical issues, review of external records, labs and coordination of care.  CC: Dr. Berneta Sages.    Signed by Kimberly Page, MD (07/26/2017 8:25 PM)

## 2017-07-28 NOTE — Progress Notes (Signed)
Labs 07/25/2017: BUN 22, creatinine 1.11, eGFR 47 mL, potassium 4.2.  Labs 05/01/2017: Total cholesterol 171, triglycerides 70, HDL 70, LDL 87.  CMP normal.  HB 11.8, platelets 183, microcytic index. Apolipoprotein B 66. TSH 2.2.  Vitamin D 28.7. Creatinine 1.1, EGFR 47/57, potassium 4.7, CMP normal.

## 2017-07-29 ENCOUNTER — Ambulatory Visit (HOSPITAL_COMMUNITY): Payer: Medicare HMO

## 2017-07-29 ENCOUNTER — Other Ambulatory Visit: Payer: Self-pay

## 2017-07-29 ENCOUNTER — Encounter (HOSPITAL_COMMUNITY)
Admission: RE | Disposition: A | Discharge status: Home / Self Care | Payer: Self-pay | Source: Ambulatory Visit | Attending: Cardiology

## 2017-07-29 ENCOUNTER — Ambulatory Visit (HOSPITAL_COMMUNITY)
Admission: RE | Admit: 2017-07-29 | Discharge: 2017-07-29 | Disposition: A | Discharge status: Home / Self Care | Payer: Medicare HMO | Source: Ambulatory Visit | Attending: Cardiology | Admitting: Cardiology

## 2017-07-29 DIAGNOSIS — I4891 Unspecified atrial fibrillation: Secondary | ICD-10-CM | POA: Diagnosis not present

## 2017-07-29 DIAGNOSIS — I714 Abdominal aortic aneurysm, without rupture: Secondary | ICD-10-CM | POA: Insufficient documentation

## 2017-07-29 DIAGNOSIS — J45909 Unspecified asthma, uncomplicated: Secondary | ICD-10-CM | POA: Insufficient documentation

## 2017-07-29 DIAGNOSIS — Z87891 Personal history of nicotine dependence: Secondary | ICD-10-CM | POA: Diagnosis not present

## 2017-07-29 DIAGNOSIS — Z79899 Other long term (current) drug therapy: Secondary | ICD-10-CM | POA: Insufficient documentation

## 2017-07-29 DIAGNOSIS — E785 Hyperlipidemia, unspecified: Secondary | ICD-10-CM | POA: Insufficient documentation

## 2017-07-29 DIAGNOSIS — E7800 Pure hypercholesterolemia, unspecified: Secondary | ICD-10-CM | POA: Diagnosis not present

## 2017-07-29 DIAGNOSIS — I428 Other cardiomyopathies: Secondary | ICD-10-CM | POA: Insufficient documentation

## 2017-07-29 DIAGNOSIS — Z7901 Long term (current) use of anticoagulants: Secondary | ICD-10-CM | POA: Diagnosis not present

## 2017-07-29 DIAGNOSIS — Z86718 Personal history of other venous thrombosis and embolism: Secondary | ICD-10-CM | POA: Insufficient documentation

## 2017-07-29 DIAGNOSIS — I2510 Atherosclerotic heart disease of native coronary artery without angina pectoris: Secondary | ICD-10-CM | POA: Insufficient documentation

## 2017-07-29 DIAGNOSIS — I110 Hypertensive heart disease with heart failure: Secondary | ICD-10-CM | POA: Diagnosis not present

## 2017-07-29 DIAGNOSIS — I480 Paroxysmal atrial fibrillation: Secondary | ICD-10-CM | POA: Diagnosis not present

## 2017-07-29 DIAGNOSIS — I10 Essential (primary) hypertension: Secondary | ICD-10-CM | POA: Insufficient documentation

## 2017-07-29 DIAGNOSIS — I509 Heart failure, unspecified: Secondary | ICD-10-CM | POA: Diagnosis not present

## 2017-07-29 HISTORY — PX: CARDIOVERSION: SHX1299

## 2017-07-29 SURGERY — CARDIOVERSION
Anesthesia: General

## 2017-07-29 MED ORDER — SODIUM CHLORIDE 0.9 % IV SOLN
INTRAVENOUS | Status: DC
  Administered 2017-07-29: 500 mL via INTRAVENOUS

## 2017-07-29 MED ORDER — PROPOFOL 10 MG/ML IV BOLUS
INTRAVENOUS | Status: DC | PRN
  Administered 2017-07-29: 25 mg via INTRAVENOUS
  Administered 2017-07-29: 50 mg via INTRAVENOUS

## 2017-07-29 NOTE — Transfer of Care (Signed)
Immediate Anesthesia Transfer of Care Note  Patient: Kimberly Villegas  Procedure(s) Performed: CARDIOVERSION (N/A )  Patient Location: Endoscopy Unit  Anesthesia Type:MAC  Level of Consciousness: awake, oriented and drowsy  Airway & Oxygen Therapy: Patient Spontanous Breathing and Patient connected to nasal cannula oxygen  Post-op Assessment: Report given to RN and Post -op Vital signs reviewed and stable  Post vital signs: Reviewed and stable  Last Vitals:  Vitals Value Taken Time  BP    Temp    Pulse    Resp    SpO2      Last Pain:  Vitals:   07/29/17 1005  TempSrc:   PainSc: 0-No pain         Complications: No apparent anesthesia complications

## 2017-07-29 NOTE — Anesthesia Preprocedure Evaluation (Addendum)
Anesthesia Evaluation  Patient identified by MRN, date of birth, ID band Patient awake    Reviewed: Allergy & Precautions, NPO status , Patient's Chart, lab work & pertinent test results  Airway Mallampati: II  TM Distance: >3 FB Neck ROM: Full    Dental no notable dental hx.    Pulmonary asthma ,    Pulmonary exam normal breath sounds clear to auscultation       Cardiovascular hypertension, Pt. on medications and Pt. on home beta blockers + CAD and + DVT  + dysrhythmias Atrial Fibrillation  Rhythm:Irregular Rate:Normal  ECG: NSR, rate 63   Neuro/Psych negative neurological ROS  negative psych ROS   GI/Hepatic Neg liver ROS,   Endo/Other  negative endocrine ROS  Renal/GU negative Renal ROS     Musculoskeletal negative musculoskeletal ROS (+)   Abdominal   Peds  Hematology HLD   Anesthesia Other Findings   Reproductive/Obstetrics                            Anesthesia Physical Anesthesia Plan  ASA: III  Anesthesia Plan: General   Post-op Pain Management:    Induction: Intravenous  PONV Risk Score and Plan: 3 and Propofol infusion and Treatment may vary due to age or medical condition  Airway Management Planned: Mask  Additional Equipment:   Intra-op Plan:   Post-operative Plan:   Informed Consent: I have reviewed the patients History and Physical, chart, labs and discussed the procedure including the risks, benefits and alternatives for the proposed anesthesia with the patient or authorized representative who has indicated his/her understanding and acceptance.   Dental advisory given  Plan Discussed with: CRNA  Anesthesia Plan Comments:         Anesthesia Quick Evaluation

## 2017-07-29 NOTE — Anesthesia Postprocedure Evaluation (Signed)
Anesthesia Post Note  Patient: Kimberly Villegas  Procedure(s) Performed: CARDIOVERSION (N/A )     Patient location during evaluation: PACU Anesthesia Type: General Level of consciousness: awake Pain management: pain level controlled Vital Signs Assessment: post-procedure vital signs reviewed and stable Respiratory status: spontaneous breathing Cardiovascular status: stable Postop Assessment: no apparent nausea or vomiting Anesthetic complications: no    Last Vitals:  Vitals:   07/29/17 1040 07/29/17 1048  BP: (!) 123/46 (!) 114/38  Pulse: 61 61  Resp: (!) 8 16  Temp:    SpO2: 99% 100%    Last Pain:  Vitals:   07/29/17 1048  TempSrc:   PainSc: 0-No pain   Pain Goal:                 Heberto Sturdevant JR,JOHN Nashia Remus

## 2017-07-29 NOTE — Anesthesia Procedure Notes (Signed)
Procedure Name: MAC Date/Time: 07/29/2017 10:09 AM Performed by: Renato Shin, CRNA Pre-anesthesia Checklist: Patient identified, Emergency Drugs available, Suction available and Patient being monitored Patient Re-evaluated:Patient Re-evaluated prior to induction Oxygen Delivery Method: Ambu bag Preoxygenation: Pre-oxygenation with 100% oxygen Induction Type: IV induction Ventilation: Mask ventilation without difficulty Placement Confirmation: positive ETCO2,  CO2 detector and breath sounds checked- equal and bilateral Dental Injury: Teeth and Oropharynx as per pre-operative assessment

## 2017-07-29 NOTE — Discharge Instructions (Signed)
Electrical Cardioversion, Care After °This sheet gives you information about how to care for yourself after your procedure. Your health care provider may also give you more specific instructions. If you have problems or questions, contact your health care provider. °What can I expect after the procedure? °After the procedure, it is common to have: °· Some redness on the skin where the shocks were given. ° °Follow these instructions at home: °· Do not drive for 24 hours if you were given a medicine to help you relax (sedative). °· Take over-the-counter and prescription medicines only as told by your health care provider. °· Ask your health care provider how to check your pulse. Check it often. °· Rest for 48 hours after the procedure or as told by your health care provider. °· Avoid or limit your caffeine use as told by your health care provider. °Contact a health care provider if: °· You feel like your heart is beating too quickly or your pulse is not regular. °· You have a serious muscle cramp that does not go away. °Get help right away if: °· You have discomfort in your chest. °· You are dizzy or you feel faint. °· You have trouble breathing or you are short of breath. °· Your speech is slurred. °· You have trouble moving an arm or leg on one side of your body. °· Your fingers or toes turn cold or blue. °This information is not intended to replace advice given to you by your health care provider. Make sure you discuss any questions you have with your health care provider. °Document Released: 02/10/2013 Document Revised: 11/24/2015 Document Reviewed: 10/27/2015 °Elsevier Interactive Patient Education © 2018 [employer] ° °

## 2017-07-29 NOTE — Interval H&P Note (Signed)
History and Physical Interval Note:  07/29/2017 9:58 AM  Kimberly Villegas  has presented today for surgery, with the diagnosis of AFIB  The various methods of treatment have been discussed with the patient and family. After consideration of risks, benefits and other options for treatment, the patient has consented to  Procedure(s): CARDIOVERSION (N/A) as a surgical intervention .  The patient's history has been reviewed, patient examined, no change in status, stable for surgery.  I have reviewed the patient's chart and labs.  Questions were answered to the patient's satisfaction.     [name]

## 2017-07-29 NOTE — CV Procedure (Signed)
Direct current cardioversion:  Indication symptomatic A. Fibrillation.  Procedure: Under deep sedation provided and monitored by anesthesiology, synchronized direct current cardioversion performed. Patient was delivered with 120 Joules of electricity X 1 with success to NSR. Patient tolerated the procedure well. No immediate complication noted.   Nigel Mormon, MD [facility] Pager: [phone] Office: [phone] If no answer Cell [phone]

## 2017-07-30 ENCOUNTER — Encounter (HOSPITAL_COMMUNITY): Payer: Self-pay | Admitting: Cardiology

## 2017-08-18 DIAGNOSIS — I10 Essential (primary) hypertension: Secondary | ICD-10-CM | POA: Diagnosis not present

## 2017-08-18 DIAGNOSIS — I480 Paroxysmal atrial fibrillation: Secondary | ICD-10-CM | POA: Diagnosis not present

## 2017-08-18 DIAGNOSIS — I714 Abdominal aortic aneurysm, without rupture: Secondary | ICD-10-CM | POA: Diagnosis not present

## 2017-10-13 DIAGNOSIS — L570 Actinic keratosis: Secondary | ICD-10-CM | POA: Diagnosis not present

## 2017-10-13 DIAGNOSIS — Z85828 Personal history of other malignant neoplasm of skin: Secondary | ICD-10-CM | POA: Diagnosis not present

## 2017-10-13 DIAGNOSIS — L7211 Pilar cyst: Secondary | ICD-10-CM | POA: Diagnosis not present

## 2017-10-13 DIAGNOSIS — D1801 Hemangioma of skin and subcutaneous tissue: Secondary | ICD-10-CM | POA: Diagnosis not present

## 2017-10-13 DIAGNOSIS — L821 Other seborrheic keratosis: Secondary | ICD-10-CM | POA: Diagnosis not present

## 2017-10-13 DIAGNOSIS — D225 Melanocytic nevi of trunk: Secondary | ICD-10-CM | POA: Diagnosis not present

## 2017-11-17 DIAGNOSIS — I480 Paroxysmal atrial fibrillation: Secondary | ICD-10-CM | POA: Diagnosis not present

## 2017-11-17 DIAGNOSIS — D638 Anemia in other chronic diseases classified elsewhere: Secondary | ICD-10-CM | POA: Diagnosis not present

## 2017-11-17 DIAGNOSIS — Z1389 Encounter for screening for other disorder: Secondary | ICD-10-CM | POA: Diagnosis not present

## 2017-11-17 DIAGNOSIS — I519 Heart disease, unspecified: Secondary | ICD-10-CM | POA: Diagnosis not present

## 2017-11-17 DIAGNOSIS — I872 Venous insufficiency (chronic) (peripheral): Secondary | ICD-10-CM | POA: Insufficient documentation

## 2017-11-17 DIAGNOSIS — Z6824 Body mass index (BMI) 24.0-24.9, adult: Secondary | ICD-10-CM | POA: Diagnosis not present

## 2017-11-17 DIAGNOSIS — K635 Polyp of colon: Secondary | ICD-10-CM | POA: Diagnosis not present

## 2017-11-17 DIAGNOSIS — I10 Essential (primary) hypertension: Secondary | ICD-10-CM | POA: Diagnosis not present

## 2017-12-12 DIAGNOSIS — A084 Viral intestinal infection, unspecified: Secondary | ICD-10-CM | POA: Diagnosis not present

## 2017-12-12 DIAGNOSIS — R197 Diarrhea, unspecified: Secondary | ICD-10-CM | POA: Diagnosis not present

## 2017-12-12 DIAGNOSIS — K921 Melena: Secondary | ICD-10-CM | POA: Diagnosis not present

## 2017-12-12 DIAGNOSIS — Z6824 Body mass index (BMI) 24.0-24.9, adult: Secondary | ICD-10-CM | POA: Diagnosis not present

## 2018-01-19 DIAGNOSIS — H353131 Nonexudative age-related macular degeneration, bilateral, early dry stage: Secondary | ICD-10-CM | POA: Diagnosis not present

## 2018-01-19 DIAGNOSIS — H04123 Dry eye syndrome of bilateral lacrimal glands: Secondary | ICD-10-CM | POA: Diagnosis not present

## 2018-01-19 DIAGNOSIS — H2513 Age-related nuclear cataract, bilateral: Secondary | ICD-10-CM | POA: Diagnosis not present

## 2018-01-21 DIAGNOSIS — R69 Illness, unspecified: Secondary | ICD-10-CM | POA: Diagnosis not present

## 2018-02-11 DIAGNOSIS — I714 Abdominal aortic aneurysm, without rupture: Secondary | ICD-10-CM | POA: Diagnosis not present

## 2018-03-02 DIAGNOSIS — I080 Rheumatic disorders of both mitral and aortic valves: Secondary | ICD-10-CM | POA: Diagnosis not present

## 2018-03-02 DIAGNOSIS — I10 Essential (primary) hypertension: Secondary | ICD-10-CM | POA: Diagnosis not present

## 2018-03-02 DIAGNOSIS — I739 Peripheral vascular disease, unspecified: Secondary | ICD-10-CM | POA: Diagnosis not present

## 2018-03-02 DIAGNOSIS — I480 Paroxysmal atrial fibrillation: Secondary | ICD-10-CM | POA: Diagnosis not present

## 2018-05-26 DIAGNOSIS — E7849 Other hyperlipidemia: Secondary | ICD-10-CM | POA: Diagnosis not present

## 2018-05-26 DIAGNOSIS — I10 Essential (primary) hypertension: Secondary | ICD-10-CM | POA: Diagnosis not present

## 2018-05-26 DIAGNOSIS — E559 Vitamin D deficiency, unspecified: Secondary | ICD-10-CM | POA: Diagnosis not present

## 2018-06-01 ENCOUNTER — Other Ambulatory Visit: Payer: Self-pay | Admitting: Internal Medicine

## 2018-06-01 DIAGNOSIS — Z1231 Encounter for screening mammogram for malignant neoplasm of breast: Secondary | ICD-10-CM

## 2018-06-02 DIAGNOSIS — K219 Gastro-esophageal reflux disease without esophagitis: Secondary | ICD-10-CM | POA: Diagnosis not present

## 2018-06-02 DIAGNOSIS — I480 Paroxysmal atrial fibrillation: Secondary | ICD-10-CM | POA: Diagnosis not present

## 2018-06-02 DIAGNOSIS — Z0000 Encounter for general adult medical examination without abnormal findings: Secondary | ICD-10-CM | POA: Diagnosis not present

## 2018-06-02 DIAGNOSIS — I10 Essential (primary) hypertension: Secondary | ICD-10-CM | POA: Diagnosis not present

## 2018-06-02 DIAGNOSIS — I2510 Atherosclerotic heart disease of native coronary artery without angina pectoris: Secondary | ICD-10-CM | POA: Diagnosis not present

## 2018-06-02 DIAGNOSIS — I519 Heart disease, unspecified: Secondary | ICD-10-CM | POA: Diagnosis not present

## 2018-06-02 DIAGNOSIS — R55 Syncope and collapse: Secondary | ICD-10-CM | POA: Diagnosis not present

## 2018-06-02 DIAGNOSIS — I6529 Occlusion and stenosis of unspecified carotid artery: Secondary | ICD-10-CM | POA: Diagnosis not present

## 2018-06-02 DIAGNOSIS — N183 Chronic kidney disease, stage 3 (moderate): Secondary | ICD-10-CM | POA: Diagnosis not present

## 2018-06-02 DIAGNOSIS — N1831 Chronic kidney disease, stage 3a: Secondary | ICD-10-CM | POA: Insufficient documentation

## 2018-06-02 DIAGNOSIS — R82998 Other abnormal findings in urine: Secondary | ICD-10-CM | POA: Diagnosis not present

## 2018-06-02 DIAGNOSIS — E7849 Other hyperlipidemia: Secondary | ICD-10-CM | POA: Diagnosis not present

## 2018-06-03 DIAGNOSIS — Z1212 Encounter for screening for malignant neoplasm of rectum: Secondary | ICD-10-CM | POA: Diagnosis not present

## 2018-07-07 ENCOUNTER — Ambulatory Visit
Admission: RE | Admit: 2018-07-07 | Discharge: 2018-07-07 | Disposition: A | Discharge status: Home / Self Care | Payer: Medicare HMO | Source: Ambulatory Visit | Attending: Internal Medicine | Admitting: Internal Medicine

## 2018-07-07 DIAGNOSIS — Z1231 Encounter for screening mammogram for malignant neoplasm of breast: Secondary | ICD-10-CM

## 2018-07-17 NOTE — Progress Notes (Signed)
Subjective:  Primary Physician:  Prince Solian, MD  Patient ID: Kimberly Villegas, female    DOB: [birth year], 84 y.o.   MRN: [id number]  Chief Complaint  Patient presents with  . Atrial Fibrillation  . Follow-up    6 mth    HPI: Kimberly Villegas  is a 84 y.o. female  with history of paroxysmal atrial fibrillation, s/p cardioversion on 07/29/17, 09/2016. She also has mild to moderate aortic and mitral regurgitation and very mild asymptomatic left subclavian artery stenosis, hypertension, and hyperlipidemia.  She was seen in January and was noted to be back in A fib. She is mostly asymptomatic; however, as she has mostly noticed A fib on her apple watch, she got concerned.  She is concerned that she may have sleep apnea. She has prescription for sleep aid and notices that she is fatigued if she does not take the medication and get 6 hours of sleep. Denies any snoring.   No complaints of dizziness, near-syncope or syncope. No chest pain, shortness of breath, orthopnea or PND. No history of swelling on the legs and no claudication. No history of GI bleed, hematuria or excessive bruising. No history of TIA or CVA.  Past Medical History:  Diagnosis Date  . A-fib ([place])   . Anemia, chronic disease   . Arthritis   . Asthma   . Bilateral carotid bruits   . CAD (coronary artery disease)   . Cervical stenosis of spine   . Cough   . Diastolic dysfunction   . DVT (deep venous thrombosis) ([place])    after vein stripping years ago  . GERD (gastroesophageal reflux disease)   . Hyperlipemia   . Hypertension   . Insomnia   . New onset a-fib ([place]) 2018  . Seasonal allergies   . Syncope   . Vitamin D deficiency     Past Surgical History:  Procedure Laterality Date  . AMPUTATION TOE Bilateral 07/25/2016   Procedure: bilateral 2nd toe amputations;  Surgeon: Wylene Simmer, MD;  Location: [facility];  Service: Orthopedics;  Laterality: Bilateral;  . BASAL CELL CARCINOMA  EXCISION    . CARDIOVERSION N/A 09/10/2016   Procedure: CARDIOVERSION;  Surgeon: Adrian Prows, MD;  Location: [facility];  Service: Cardiovascular;  Laterality: N/A;  . CARDIOVERSION N/A 07/29/2017   Procedure: CARDIOVERSION;  Surgeon: Nigel Mormon, MD;  Location: [facility];  Service: Cardiovascular;  Laterality: N/A;  . FOOT SURGERY Left   . JOINT REPLACEMENT Right   . VEIN LIGATION AND STRIPPING Right     Social History   Socioeconomic History  . Marital status: Married    Spouse name: Not on file  . Number of children: 3  . Years of education: Not on file  . Highest education level: Not on file  Occupational History  . Not on file  Social Needs  . Financial resource strain: Not on file  . Food insecurity:    Worry: Not on file    Inability: Not on file  . Transportation needs:    Medical: Not on file    Non-medical: Not on file  Tobacco Use  . Smoking status: Never Smoker  . Smokeless tobacco: Never Used  Substance and Sexual Activity  . Alcohol use: Yes    Comment: glass of wine with dinner  . Drug use: No  . Sexual activity: Not on file  Lifestyle  . Physical activity:    Days per week: Not on file  Minutes per session: Not on file  . Stress: Not on file  Relationships  . Social connections:    Talks on phone: Not on file    Gets together: Not on file    Attends religious service: Not on file    Active member of club or organization: Not on file    Attends meetings of clubs or organizations: Not on file    Relationship status: Not on file  . Intimate partner violence:    Fear of current or ex partner: Not on file    Emotionally abused: Not on file    Physically abused: Not on file    Forced sexual activity: Not on file  Other Topics Concern  . Not on file  Social History Narrative  . Not on file    Current Outpatient Medications on File Prior to Visit  Medication Sig Dispense Refill  . apixaban (ELIQUIS) 5 MG TABS tablet Take 5 mg by mouth  2 (two) times daily.    Marland Kitchen atorvastatin (LIPITOR) 40 MG tablet Take 40 mg by mouth every evening.     Marland Kitchen BREO ELLIPTA 200-25 MCG/INH AEPB Inhale 1 puff into the lungs every 4 (four) hours as needed (shortness of breath). Use as directed  0  . Calcium-Vit D-Arg-Inos-Silicon (BONE DENSITY PO) Take 1-2 tablets by mouth See admin instructions. Take 1 tablet by mouth in the morning and take 2 tablets by mouth in the evening    . Cholecalciferol (VITAMIN D) 2000 units tablet Take 2,000 Units by mouth daily.    Marland Kitchen losartan (COZAAR) 50 MG tablet Take 1 tablet by mouth daily.    . Misc Natural Products (FIBER 7) POWD Take 1 scoop by mouth daily.     . mometasone-formoterol (DULERA) 100-5 MCG/ACT AERO Inhale 2 puffs into the lungs 2 (two) times daily as needed for wheezing or shortness of breath.    . montelukast (SINGULAIR) 10 MG tablet Take 10 mg by mouth at bedtime as needed (cough).    . Multiple Vitamins-Calcium (ONE-A-DAY WOMENS PO) Take 1 tablet by mouth daily.    . Multiple Vitamins-Minerals (PRESERVISION AREDS PO) Take 1 capsule by mouth 2 (two) times daily.     Marland Kitchen PAZEO 0.7 % SOLN Place 1 drop into the right eye daily as needed for irritation.  3  . TOPROL XL 50 MG 24 hr tablet Take 50 mg by mouth daily.    . zaleplon (SONATA) 5 MG capsule Take 5 mg by mouth at bedtime as needed for sleep.    Marland Kitchen losartan (COZAAR) 25 MG tablet Take 1 tablet (25 mg total) by mouth daily. 90 tablet 3  . Tretinoin, Facial Wrinkles, (RENOVA PUMP) 0.02 % CREA Apply 1 application topically every other day.     No current facility-administered medications on file prior to visit.     Review of Systems  Constitutional: Negative for malaise/fatigue and weight loss.  Respiratory: Negative for cough, hemoptysis and shortness of breath.   Cardiovascular: Negative for chest pain, palpitations, claudication and leg swelling.  Gastrointestinal: Negative for abdominal pain, blood in stool, constipation, heartburn and vomiting.   Genitourinary: Negative for dysuria.  Musculoskeletal: Negative for joint pain and myalgias.  Neurological: Negative for dizziness, focal weakness and headaches.  Endo/Heme/Allergies: Does not bruise/bleed easily.  Psychiatric/Behavioral: Negative for depression. The patient has insomnia. The patient is not nervous/anxious.   All other systems reviewed and are negative.      Objective:  Blood pressure (!) 160/82, pulse 80, height  $'5\' 5"'Z$  (1.651 m), weight 150 lb 11.2 oz (68.4 kg), SpO2 99 %. Body mass index is 25.08 kg/m.  Physical Exam  Constitutional: She appears well-developed and well-nourished. No distress.  Younger looking than age  HENT:  Head: Atraumatic.  Eyes: Conjunctivae are normal.  Neck: Neck supple. No JVD present. No thyromegaly present.  Cardiovascular: Normal rate, regular rhythm and intact distal pulses. Exam reveals no gallop.  Murmur heard.  Early systolic murmur is present with a grade of 3/6.  Early diastolic murmur is present. Pulses:      Carotid pulses are on the right side with bruit and on the left side with bruit. Grade 3/6 ESM and soft EDM is audible in the aortic and left parasternal area. Grade 2/6 midsystolic murmur is audible at the apex.  Pulmonary/Chest: Effort normal and breath sounds normal.  Abdominal: Soft. Bowel sounds are normal.  Musculoskeletal: Normal range of motion.        General: No edema.  Neurological: She is alert.  Skin: Skin is warm and dry.  Psychiatric: She has a normal mood and affect.    CARDIAC STUDIES:   Abdominal aortic duplex 02/11/2018: The maximum aorta diameter is 2.47 cm (prox) with mild ectasia. Calcific plaque noted in the abdominal aorta.  Normal flow velocities noted in the bilateral iliac arteries. Compared to 05/24/2015, Maximum aortid dilatation was 2.92. F/U studies if clinically indicated.  Abdominal Ultrasound 02/11/2018: The maximum aorta diameter is 2.47 cm (prox) with mild ectasia. Calcific plaque  noted in the abdominal aorta.  Normal flow velocities noted in the bilateral iliac arteries. Compared to 05/24/2015, Maximum aortid dilatation was 2.92. F/U studies if clinically indicated.  Successful cardioversion 07/29/2017, 09/10/2016.   Echocardiogram 03/25/2017: Left ventricle cavity is normal in size. Normal global wall motion. Grade II diastolic dysfunction. Elevated LVEDP. Calculated EF 55%. Mild biatrial dilatation.  Mild to moderate aortic, mitral, and tricuspid regurgitation. Mild pulmonic regurgitation. Elevated central venous pressure. Estimated pulmonary artery systolic pressure 48 mmHg. Compared to prior study dated 08/15/2016, dominant rhythm is now sinus. Pulmonary hypertension has progressed.  Carotid artery duplex 03/25/2017: Stenosis in the right internal carotid artery (16-49%). Antegrade right vertebral artery flow. Bidirectional left vertebral artery flow suggests left subclavian artery stenosis. Follow up in one year is appropriate if clinically indicated. Compared to 07/18/2016, right ICA stenosis  new. Patient has known subclavian stenosis.  Lexiscan myoview stress test: 03/03/2017:                                                                        1. The exercise tolerance was not assessed due to pharmacologic stress. Stress EKG Test Results Normal.     2. Normal SPECT study with normal left ventricular systolic function Low risk study. Normal left ventricular systolic function. No scar or ischemia detected by nuclear imaging.  Event Monitor: Episodic transmission 02/20/2017 reveals 4 beat run of NSVT at 12:20 a.m. Unscheduled transmission 03/01/17: 4.4 second ventricular standstill at 5am. Rhythm converted from atypical A. Flutter to NSR. No change in therapy for now, continue to observe  Subclavian artery duplex 07/18/2016: Left subclavian artery is abnormal, monophasic, suggestive of proximal obstruction. Left vertebral artery waveform shows high  resistance. Antegrade right vertebral artery flow.  Blood Pressure Right: 155/80   Left: 135/75 No hemodynamically significant arterial disease in the common carotid artery bilaterally. Compared to the study done on 05/30/2015, left subclavian artery monophasic waveform now well demonstrated.  Otherwise no significant change.  Lower extremity arterial duplex 05/24/2015:  No hemodynamically significant stenoses are identified in bilateral lower extremity arterial system. Mild plaque noted.  This exam reveals normal perfusion of both  the  lower extremities with RABI >1.14 and LABI 1.07.  Sleep Study 2006: Normal.  Recent Labs:   Labs 07/25/2017: BUN 22, creatinine 1.11, eGFR 47 mL, potassium 4.2. Labs 05/01/2017: Total cholesterol 171, triglycerides 70, HDL 70, LDL 87.  CMP normal.  HB 11.8, platelets 183, microcytic index. Apolipoprotein B 66. TSH 2.2.  Vitamin D 28.7. Creatinine 1.1, EGFR 47/57, potassium 4.7, CMP normal.   Assessment & Recommendations:   Atrial fibrillation with controlled ventricular rate (HCC) - Plan: EKG 12-Lead  Essential hypertension  EKG 07/20/2018: Atrial fibrillation at 78 bpm, normal axis, no evidence of ischemia.   PCP EKG 06/02/2018: Atrial flutter 3:1 conduction with controlled ventricular response at 80 bpm, normal axis, no evidence of ischemia.  Recommendation:   Patient referred back to me and also presents here for routine visit for follow-up of recurrent atrial fibrillation, atypical atrial flutter.  She is completely asymptomatic with regard to arrhythmia, heart rate is well controlled, hence would not pursue cardioversion again.  I have discontinued the amiodarone. Blood pressure is elevated, due to underlying close to bradycardia, do not want to increase beta blockers, will add amlodipine 5 mg daily.  She'll continue to monitor her blood pressure closely and have recommended systolic blood pressure less than 130 mmHg.  With regard to sleep  disturbance, probably related to have it of watching TV late and have discussed making changes and also OTC sleeping aid.  She is concerned about using Sonata on a daily basis.  I'd like to see her back in 2-3 months for follow-up and some discontinuing amiodarone to follow-up on atrial fibrillation and heart rate control and hypertension.  Adrian Prows, MD, [facility] 07/21/2018, 10:13 PM [facility] Pager: [phone] Office: [phone] If no answer Cell [phone]

## 2018-07-20 ENCOUNTER — Other Ambulatory Visit: Payer: Self-pay

## 2018-07-20 ENCOUNTER — Encounter: Payer: Self-pay | Admitting: Cardiology

## 2018-07-20 ENCOUNTER — Ambulatory Visit: Payer: Medicare HMO | Admitting: Cardiology

## 2018-07-20 VITALS — BP 160/82 | HR 80 | Ht 65.0 in | Wt 150.7 lb

## 2018-07-20 DIAGNOSIS — I4891 Unspecified atrial fibrillation: Secondary | ICD-10-CM | POA: Diagnosis not present

## 2018-07-20 DIAGNOSIS — I10 Essential (primary) hypertension: Secondary | ICD-10-CM

## 2018-07-20 MED ORDER — AMLODIPINE BESYLATE 5 MG PO TABS: 5.0000 mg | ORAL_TABLET | Freq: Every day | ORAL | 1 refills | Status: DC

## 2018-08-16 ENCOUNTER — Other Ambulatory Visit: Payer: Self-pay | Admitting: Cardiology

## 2018-08-31 ENCOUNTER — Ambulatory Visit: Payer: Self-pay | Admitting: Cardiology

## 2018-09-13 NOTE — Progress Notes (Signed)
Virtual Visit via Video Note: This visit type was conducted due to national recommendations for restrictions regarding the COVID-19 Pandemic (e.g. social distancing).  This format is felt to be most appropriate for this patient at this time.  All issues noted in this document were discussed and addressed.  No physical exam was performed (except for noted visual exam findings with Telehealth visits).  The patient has consented to conduct a Telehealth visit and understands insurance will be billed.   I connected with@, on 09/19/18 at  by a video enabled telemedicine application and verified that I am speaking with the correct person using two identifiers.   I discussed the limitations of evaluation and management by telemedicine and the availability of in person appointments. The patient expressed understanding and agreed to proceed.   I have discussed with patient regarding the safety during COVID Pandemic and steps and precautions to be taken including social distancing, frequent hand wash and use of detergent soap, gels with the patient. I asked the patient to avoid touching mouth, nose, eyes, ears with the hands. I encouraged regular walking around the neighborhood and exercise and regular diet, as long as social distancing can be maintained.  Primary Physician/Referring:  Prince Solian, MD  Patient ID: Kimberly Villegas, female    DOB: 04-Nov-1934, 84 y.o.   MRN: [id number]  Chief Complaint  Patient presents with  . Hypertension    low bp, med questions   . Atrial Fibrillation    HPI: Kimberly Villegas  is a 84 y.o. female  with paroxysmal atrial fibrillation, s/p cardioversion on 07/29/17, 09/2016. She also has mild to moderate aortic and mitral regurgitation and very mild asymptomatic left subclavian artery stenosis, hypertension, and hyperlipidemia.  She was seen in January and was noted to be back in A fib. She is mostly asymptomatic; however, as she has mostly noticed A fib on her  apple watch, she got concerned and was seen by me a month ago. I discontinued Amiodarone, started amlodipine for hypertension and she now presents for f/u of hypertension and A. Fib.  No new symptoms. Past Medical History:  Diagnosis Date  . A-fib ([place])   . Anemia, chronic disease   . Arthritis   . Asthma   . Bilateral carotid bruits   . CAD (coronary artery disease)   . Cervical stenosis of spine   . Cough   . Diastolic dysfunction   . DVT (deep venous thrombosis) ([place])    after vein stripping years ago  . GERD (gastroesophageal reflux disease)   . Hyperlipemia   . Hypertension   . Insomnia   . New onset a-fib ([place]) 2018  . Seasonal allergies   . Syncope   . Vitamin D deficiency     Past Surgical History:  Procedure Laterality Date  . AMPUTATION TOE Bilateral 07/25/2016   Procedure: bilateral 2nd toe amputations;  Surgeon: Wylene Simmer, MD;  Location: [facility];  Service: Orthopedics;  Laterality: Bilateral;  . BASAL CELL CARCINOMA EXCISION    . CARDIOVERSION N/A 09/10/2016   Procedure: CARDIOVERSION;  Surgeon: Adrian Prows, MD;  Location: [facility];  Service: Cardiovascular;  Laterality: N/A;  . CARDIOVERSION N/A 07/29/2017   Procedure: CARDIOVERSION;  Surgeon: Nigel Mormon, MD;  Location: [facility];  Service: Cardiovascular;  Laterality: N/A;  . FOOT SURGERY Left   . JOINT REPLACEMENT Right   . VEIN LIGATION AND STRIPPING Right     Social History   Socioeconomic History  . Marital status:  Married    Spouse name: Not on file  . Number of children: 3  . Years of education: Not on file  . Highest education level: Not on file  Occupational History  . Not on file  Social Needs  . Financial resource strain: Not on file  . Food insecurity:    Worry: Not on file    Inability: Not on file  . Transportation needs:    Medical: Not on file    Non-medical: Not on file  Tobacco Use  . Smoking status: Never Smoker  . Smokeless tobacco: Never Used   Substance and Sexual Activity  . Alcohol use: Yes    Comment: glass of wine with dinner  . Drug use: No  . Sexual activity: Not on file  Lifestyle  . Physical activity:    Days per week: Not on file    Minutes per session: Not on file  . Stress: Not on file  Relationships  . Social connections:    Talks on phone: Not on file    Gets together: Not on file    Attends religious service: Not on file    Active member of club or organization: Not on file    Attends meetings of clubs or organizations: Not on file    Relationship status: Not on file  . Intimate partner violence:    Fear of current or ex partner: Not on file    Emotionally abused: Not on file    Physically abused: Not on file    Forced sexual activity: Not on file  Other Topics Concern  . Not on file  Social History Narrative  . Not on file    Review of Systems  Constitution: Negative for chills, decreased appetite, malaise/fatigue and weight gain.  Cardiovascular: Positive for dyspnea on exertion. Negative for leg swelling and syncope.  Respiratory: Positive for snoring.   Endocrine: Negative for cold intolerance.  Hematologic/Lymphatic: Does not bruise/bleed easily.  Musculoskeletal: Negative for joint swelling.  Gastrointestinal: Negative for abdominal pain, anorexia and change in bowel habit.  Neurological: Negative for headaches and light-headedness.  Psychiatric/Behavioral: Negative for depression and substance abuse.  All other systems reviewed and are negative.     Objective  Blood pressure (!) 91/47, pulse 81, height '5\' 5"'$  (1.651 m), weight 149 lb (67.6 kg). Body mass index is 24.79 kg/m.   Physical exam not performed or limited due to virtual visit.   Please see exam details from prior visit is as below.  Physical Exam  Constitutional: She appears well-developed. No distress.  Moderately obese  HENT:  Head: Atraumatic.  Eyes: Conjunctivae are normal.  Neck: Neck supple. No JVD present. No  thyromegaly present.  Cardiovascular: Normal rate, regular rhythm, normal heart sounds and intact distal pulses. Exam reveals no gallop.  No murmur heard. Pulmonary/Chest: Effort normal and breath sounds normal.  Abdominal: Soft. Bowel sounds are normal.  obese  Musculoskeletal: Normal range of motion.  Neurological: She is alert.  Skin: Skin is warm and dry.  Psychiatric: She has a normal mood and affect.   Radiology: No results found.  Laboratory examination:   06/23/2017: Cholesterol 308, triglycerides 259, HDL 32, LDL 230.  Labs 03/21/2017: A1c 7.2%. Serum glucose 106 mg, BUN 14, creatinine 1.15, eGFR 80 mL, potassium 4.3. CMP otherwise normal. Total cholesterol 06/06/24, triglycerides 3 and 29, HDL 31, LDL 129.  CMP Latest Ref Rng & Units 07/27/2016  Glucose 65 - 99 mg/dL 87  BUN 6 - 20 mg/dL 18  Creatinine 0.44 - 1.00 mg/dL 0.95  Sodium 135 - 145 mmol/L 139  Potassium 3.5 - 5.1 mmol/L 3.9  Chloride 101 - 111 mmol/L 103  CO2 22 - 32 mmol/L 28  Calcium 8.9 - 10.3 mg/dL 9.6   CBC Latest Ref Rng & Units 07/27/2016  WBC 4.0 - 10.5 K/uL 6.9  Hemoglobin 12.0 - 15.0 g/dL 11.9(L)  Hematocrit 36.0 - 46.0 % 36.2  Platelets 150 - 400 K/uL 182   Lipid Panel  No results found for: CHOL, TRIG, HDL, CHOLHDL, VLDL, LDLCALC, LDLDIRECT HEMOGLOBIN A1C No results found for: HGBA1C, MPG TSH No results for input(s): TSH in the last 8760 hours.  PRN Meds:. Medications Discontinued During This Encounter  Medication Reason  . losartan (COZAAR) 25 MG tablet   . mometasone-formoterol (DULERA) 100-5 MCG/ACT AERO Error  . montelukast (SINGULAIR) 10 MG tablet Error  . Misc Natural Products (FIBER 7) POWD Error  . BREO ELLIPTA 200-25 MCG/INH AEPB Error  . amLODipine (NORVASC) 5 MG tablet Reorder   Current Meds  Medication Sig  . amLODipine (NORVASC) 5 MG tablet Take 1 tablet (5 mg total) by mouth daily.  Marland Kitchen apixaban (ELIQUIS) 5 MG TABS tablet Take 5 mg by mouth 2 (two) times daily.  Marland Kitchen  atorvastatin (LIPITOR) 40 MG tablet TAKE 1 TABLET DAILY  . Calcium-Vit D-Arg-Inos-Silicon (BONE DENSITY PO) Take 1-2 tablets by mouth See admin instructions. Take 1 tablet by mouth in the morning and take 2 tablets by mouth in the evening  . cephALEXin (KEFLEX) 500 MG capsule Take 500 mg by mouth 4 (four) times daily. 2000 mg prior to dental procedures  . Cholecalciferol (VITAMIN D) 2000 units tablet Take 2,000 Units by mouth daily.  Marland Kitchen losartan (COZAAR) 50 MG tablet Take 1 tablet by mouth daily.  . Multiple Vitamins-Calcium (ONE-A-DAY WOMENS PO) Take 1 tablet by mouth daily.  . Multiple Vitamins-Minerals (PRESERVISION AREDS PO) Take 1 capsule by mouth 2 (two) times daily.   Marland Kitchen PAZEO 0.7 % SOLN Place 1 drop into the right eye daily as needed for irritation.  . TOPROL XL 50 MG 24 hr tablet Take 50 mg by mouth daily.  . Tretinoin, Facial Wrinkles, (RENOVA PUMP) 0.02 % CREA Apply 1 application topically every other day.  . zaleplon (SONATA) 5 MG capsule Take 5 mg by mouth at bedtime as needed for sleep.  . [DISCONTINUED] amLODipine (NORVASC) 5 MG tablet Take 1 tablet (5 mg total) by mouth daily for 30 days.  . [DISCONTINUED] mometasone-formoterol (DULERA) 100-5 MCG/ACT AERO Inhale 2 puffs into the lungs 2 (two) times daily as needed for wheezing or shortness of breath.  . [DISCONTINUED] montelukast (SINGULAIR) 10 MG tablet Take 10 mg by mouth at bedtime as needed (cough).    Cardiac Studies:    Abdominal aortic duplex 02/11/2018: The maximum aorta diameter is 2.47 cm (prox) with mild ectasia. Calcific plaque noted in the abdominal aorta.  Normal flow velocities noted in the bilateral iliac arteries. Compared to 05/24/2015, Maximum aortid dilatation was 2.92. F/U studies if clinically indicated.  Abdominal Ultrasound 02/11/2018: The maximum aorta diameter is 2.47 cm (prox) with mild ectasia. Calcific plaque noted in the abdominal aorta.  Normal flow velocities noted in the bilateral iliac arteries.  Compared to 05/24/2015, Maximum aortid dilatation was 2.92. F/U studies if clinically indicated.  Successful cardioversion 07/29/2017, 09/10/2016.   Echocardiogram 03/25/2017: Left ventricle cavity is normal in size. Normal global wall motion. Grade II diastolic dysfunction. Elevated LVEDP. Calculated EF 55%. Mild biatrial dilatation.  Mild to moderate aortic, mitral, and tricuspid regurgitation. Mild pulmonic regurgitation. Elevated central venous pressure. Estimated pulmonary artery systolic pressure 48 mmHg. Compared to prior study dated 08/15/2016, dominant rhythm is now sinus. Pulmonary hypertension has progressed.  Carotid artery duplex 03/25/2017: Stenosis in the right internal carotid artery (16-49%). Antegrade right vertebral artery flow. Bidirectional left vertebral artery flow suggests left subclavian artery stenosis. Follow up in one year is appropriate if clinically indicated. Compared to 07/18/2016, right ICA stenosis  new. Patient has known subclavian stenosis.  Lexiscan myoview stress test: 03/03/2017:                                                                        1. The exercise tolerance was not assessed due to pharmacologic stress. Stress EKG Test Results Normal.     2. Normal SPECT study with normal left ventricular systolic function Low risk study. Normal left ventricular systolic function. No scar or ischemia detected by nuclear imaging.  Event Monitor: Episodic transmission 02/20/2017 reveals 4 beat run of NSVT at 12:20 a.m. Unscheduled transmission 03/01/17: 4.4 second ventricular standstill at 5am. Rhythm converted from atypical A. Flutter to NSR. No change in therapy for now, continue to observe  Subclavian artery duplex 07/18/2016: Left subclavian artery is abnormal, monophasic, suggestive of proximal obstruction. Left vertebral artery waveform shows high resistance. Antegrade right vertebral artery flow.  Blood Pressure Right: 155/80   Left: 135/75 No  hemodynamically significant arterial disease in the common carotid artery bilaterally. Compared to the study done on 05/30/2015, left subclavian artery monophasic waveform now well demonstrated.  Otherwise no significant change.  Lower extremity arterial duplex 05/24/2015:  No hemodynamically significant stenoses are identified in bilateral lower extremity arterial system. Mild plaque noted.  This exam reveals normal perfusion of both  the  lower extremities with RABI >1.14 and LABI 1.07.  Sleep Study 2006: Normal.  Assessment   Essential hypertension - Plan: amLODipine (NORVASC) 5 MG tablet  Paroxysmal atrial fibrillation (HCC) CHA2DS2-VASc Score is 4. Yearly risk of stroke: 4;  PCP EKG 06/02/2018: Atrial flutter 3:1 conduction with controlled ventricular response at 80 bpm, normal axis, no evidence of ischemia.  Recommendations:   Patient was seen by me on 07/20/2018 for follow-up and management of persistent atrial fibrillation and atypical atrial flutter.  Patient completely asymptomatic without any symptoms to suggest need for cardioversion.  No shortness breath, no palpitations, otherwise remains well.  Her last office visit I started her on amlodipine 5 mg which she is tolerating the pressure is now much improved.  Like to see her back in 6 months for follow-up.  On her last office visit I discontinued amiodarone in view of persistent atrial fibrillation, heart rate remains stable and controlled.  Although blood pressure today is recorded to be low, she is completely asymptomatic without dizziness.  Mostly of blood pressure pain in 944-967 range systolic.  Hence I did not make any changes to her medications. This was a 25 minute office visit encounter with greater than 50% of the time spent face-to-face encounter with regard to discussion of hypertension and management of persistent atrial fibrillation.  Adrian Prows, MD, [facility] 09/19/2018, 10:11 PM [facility]. [place] Pager:  [phone] Office: [phone] If no answer Cell [phone]

## 2018-09-14 ENCOUNTER — Other Ambulatory Visit: Payer: Self-pay

## 2018-09-14 ENCOUNTER — Ambulatory Visit (INDEPENDENT_AMBULATORY_CARE_PROVIDER_SITE_OTHER): Payer: Medicare HMO | Admitting: Cardiology

## 2018-09-14 ENCOUNTER — Encounter: Payer: Self-pay | Admitting: Cardiology

## 2018-09-14 VITALS — BP 91/47 | HR 81 | Ht 65.0 in | Wt 149.0 lb

## 2018-09-14 DIAGNOSIS — I10 Essential (primary) hypertension: Secondary | ICD-10-CM

## 2018-09-14 DIAGNOSIS — I480 Paroxysmal atrial fibrillation: Secondary | ICD-10-CM

## 2018-09-14 MED ORDER — AMLODIPINE BESYLATE 5 MG PO TABS: 5.0000 mg | ORAL_TABLET | Freq: Every day | ORAL | 1 refills | Status: DC

## 2018-09-19 ENCOUNTER — Encounter: Payer: Self-pay | Admitting: Cardiology

## 2018-09-30 DIAGNOSIS — M79674 Pain in right toe(s): Secondary | ICD-10-CM | POA: Diagnosis not present

## 2018-09-30 DIAGNOSIS — M25571 Pain in right ankle and joints of right foot: Secondary | ICD-10-CM | POA: Diagnosis not present

## 2018-10-01 ENCOUNTER — Telehealth: Payer: Self-pay | Admitting: Podiatry

## 2018-10-02 ENCOUNTER — Other Ambulatory Visit: Payer: Self-pay

## 2018-10-02 ENCOUNTER — Ambulatory Visit: Payer: Medicare HMO | Admitting: Podiatry

## 2018-10-02 ENCOUNTER — Encounter: Payer: Self-pay | Admitting: Podiatry

## 2018-10-02 VITALS — BP 139/78 | HR 83 | Temp 97.7°F | Resp 16

## 2018-10-02 DIAGNOSIS — L600 Ingrowing nail: Secondary | ICD-10-CM | POA: Diagnosis not present

## 2018-10-02 DIAGNOSIS — M79675 Pain in left toe(s): Secondary | ICD-10-CM

## 2018-10-02 DIAGNOSIS — M79674 Pain in right toe(s): Secondary | ICD-10-CM | POA: Diagnosis not present

## 2018-10-02 DIAGNOSIS — B351 Tinea unguium: Secondary | ICD-10-CM

## 2018-10-02 MED ORDER — NEOMYCIN-POLYMYXIN-HC 3.5-10000-1 OT SOLN: OTIC | 1 refills | Status: DC

## 2018-10-02 NOTE — Patient Instructions (Signed)

## 2018-10-02 NOTE — Progress Notes (Signed)
   Subjective:    Patient ID: Kimberly Villegas, female    DOB: 17-May-1934, 84 y.o.   MRN: [id number]  HPI    Review of Systems  All other systems reviewed and are negative.      Objective:   Physical Exam        Assessment & Plan:

## 2018-10-03 NOTE — Progress Notes (Signed)
Subjective:   Patient ID: Kimberly Villegas, female   DOB: 84 y.o.   MRN: [id number]   HPI Patient presents stating that she has painful ingrown toenails of both feet that she cannot take care of herself and is tried to soak them and trim them and also has nail disease of both feet that is bothersome.  Patient states the nails have been this way for a while and worse in the last few weeks.  Patient does not smoke and is relatively active   Review of Systems  All other systems reviewed and are negative.       Objective:  Physical Exam Vitals signs and nursing note reviewed.  Constitutional:      Appearance: She is well-developed.  Pulmonary:     Effort: Pulmonary effort is normal.  Musculoskeletal: Normal range of motion.  Skin:    General: Skin is warm.  Neurological:     Mental Status: She is alert.     Neurovascular status was found to be intact with muscle strength adequate.  She is on blood thinner but does not have excessive bleeding incident and does have thick yellow brittle nailbeds with incurvation of the lateral borders of the hallux bilateral that are very irritated with no redness or drainage noted.  Patient was found to have good digital perfusion well oriented x3     Assessment:  Moderate mycotic nail infection with pain bilateral along with ingrown toenail deformity hallux bilateral that are painful     Plan:  H&P both conditions discussed and reviewed nail border removal.  She wants surgery understanding risk and she signed consent form reviewing the risk present.  Today I infiltrated each hallux 60 mg like Marcaine mixture sterile preps applied and using sterile instrumentation remove the lateral borders exposed matrix and applied phenol 3 applications 30 seconds followed by alcohol lavage and sterile dressing.  I then debrided all remaining nails gave instructions for soaks and to leave the dressings on 24 hours but would take them off early if any throbbing were  to occur and wrote prescription for drops and encouraged to call with questions concerns she may have

## 2018-10-12 NOTE — Telephone Encounter (Signed)
I saw Dr. Rhona Raider yesterday and had x-rays taken. I am scheduled to see Dr. Paulla Dolly tomorrow and I wanted to know if you could get the x-rays to Dr. Paulla Dolly from Dr. Rhona Raider before I come in and have to have another set of x-rays taken tomorrow. Dr. Rhona Raider stated he was sending them to Dr. Paulla Dolly.

## 2018-10-13 DIAGNOSIS — Z85828 Personal history of other malignant neoplasm of skin: Secondary | ICD-10-CM | POA: Diagnosis not present

## 2018-10-13 DIAGNOSIS — L570 Actinic keratosis: Secondary | ICD-10-CM | POA: Diagnosis not present

## 2018-10-13 DIAGNOSIS — D1801 Hemangioma of skin and subcutaneous tissue: Secondary | ICD-10-CM | POA: Diagnosis not present

## 2018-10-13 DIAGNOSIS — L814 Other melanin hyperpigmentation: Secondary | ICD-10-CM | POA: Diagnosis not present

## 2018-10-13 DIAGNOSIS — L821 Other seborrheic keratosis: Secondary | ICD-10-CM | POA: Diagnosis not present

## 2018-10-14 DIAGNOSIS — M79674 Pain in right toe(s): Secondary | ICD-10-CM | POA: Diagnosis not present

## 2018-11-15 ENCOUNTER — Other Ambulatory Visit: Payer: Self-pay | Admitting: Cardiology

## 2018-11-27 ENCOUNTER — Encounter: Payer: Self-pay | Admitting: Cardiology

## 2018-11-27 ENCOUNTER — Ambulatory Visit (INDEPENDENT_AMBULATORY_CARE_PROVIDER_SITE_OTHER): Payer: Medicare HMO | Admitting: Cardiology

## 2018-11-27 ENCOUNTER — Other Ambulatory Visit: Payer: Self-pay

## 2018-11-27 VITALS — BP 131/75 | HR 92 | Temp 98.7°F | Ht 65.0 in | Wt 143.8 lb

## 2018-11-27 DIAGNOSIS — I484 Atypical atrial flutter: Secondary | ICD-10-CM | POA: Diagnosis not present

## 2018-11-27 DIAGNOSIS — E7800 Pure hypercholesterolemia, unspecified: Secondary | ICD-10-CM

## 2018-11-27 DIAGNOSIS — I10 Essential (primary) hypertension: Secondary | ICD-10-CM | POA: Diagnosis not present

## 2018-11-27 DIAGNOSIS — I480 Paroxysmal atrial fibrillation: Secondary | ICD-10-CM

## 2018-11-27 DIAGNOSIS — R0989 Other specified symptoms and signs involving the circulatory and respiratory systems: Secondary | ICD-10-CM

## 2018-11-27 NOTE — Progress Notes (Signed)
Primary Physician/Referring:  Prince Solian, MD  Patient ID: Kimberly Villegas, female    DOB: 1934-08-10, 84 y.o.   MRN: [id number]  Chief Complaint  Patient presents with  . Atrial Fibrillation  . Hypertension  . Follow-up    21mo   HPI: Kimberly Villegas is a 84y.o. female  with paroxysmal atrial fibrillation, s/p cardioversion on 07/29/17, 09/2016. She also has mild to moderate aortic and mitral regurgitation and very mild asymptomatic left subclavian artery stenosis, hypertension, and hyperlipidemia.  Due to recurrence of atrial fibrillation in spite of being on amiodarone, this was discontinued and we have been treating her for hypertension and continued anticoagulation for atrial fibrillation/atrial flutter.  She is occasionally noticed dizziness as the blood pressure drops but states that blood pressure also remains extremely high at times.  She has been taking all her and The medications in the morning.  She is tolerating anti-correlation without any bleeding diathesis.  This is a two-month office visit.  Past Medical History:  Diagnosis Date  . [place]fib ([place]   . Anemia, chronic disease   . Arthritis   . Asthma   . Bilateral carotid bruits   . CAD (coronary artery disease)   . Cervical stenosis of spine   . Cough   . Diastolic dysfunction   . DVT (deep venous thrombosis) ([facility]    after vein stripping years ago  . GERD (gastroesophageal reflux disease)   . Hyperlipemia   . Hypertension   . Insomnia   . New onset a-fib ([place]  . Seasonal allergies   . Syncope   . Vitamin D deficiency     Past Surgical History:  Procedure Laterality Date  . AMPUTATION TOE Bilateral 07/25/2016   Procedure: bilateral 2nd toe amputations;  Surgeon: JWylene Simmer MD;  [place] [facility]  Service: Orthopedics;  Laterality: Bilateral;  . BASAL CELL CARCINOMA EXCISION    . CARDIOVERSION N/A 09/10/2016   Procedure: CARDIOVERSION;  Surgeon: GAdrian Prows MD;   [place] [facility]  Service: Cardiovascular;  Laterality: N/A;  . CARDIOVERSION N/A 07/29/2017   Procedure: CARDIOVERSION;  Surgeon: PNigel Mormon MD;  [place] [facility]  Service: Cardiovascular;  Laterality: N/A;  . FOOT SURGERY Left   . JOINT REPLACEMENT Right   . VEIN LIGATION AND STRIPPING Right     Social History   Socioeconomic History  . Marital status: Married    Spouse name: Not on file  . Number of children: 3  . Years of education: Not on file  . Highest education level: Not on file  Occupational History  . Not on file  Social Needs  . Financial resource strain: Not on file  . Food insecurity    Worry: Not on file    Inability: Not on file  . Transportation needs    Medical: Not on file    Non-medical: Not on file  Tobacco Use  . Smoking status: Never Smoker  . Smokeless tobacco: Never Used  Substance and Sexual Activity  . Alcohol use: Not Currently  . Drug use: No  . Sexual activity: Not on file  Lifestyle  . Physical activity    Days per week: Not on file    Minutes per session: Not on file  . Stress: Not on file  Relationships  . Social [place]: Not on file    Gets together: Not on file    Attends religious service: Not on file  Active member of club or organization: Not on file    Attends meetings of clubs or organizations: Not on file    Relationship status: Not on file  . Intimate partner violence    Fear of current or ex partner: Not on file    Emotionally abused: Not on file    Physically abused: Not on file    Forced sexual activity: Not on file  Other Topics Concern  . Not on file  Social History Narrative  . Not on file   Review of Systems  Constitution: Negative for chills, decreased appetite, malaise/fatigue and weight gain.  Cardiovascular: Positive for dyspnea on exertion (mild and stable). Negative for leg swelling and syncope.  Respiratory: Positive for snoring.   Endocrine: Negative for  cold intolerance.  Hematologic/Lymphatic: Does not bruise/bleed easily.  Musculoskeletal: Negative for joint swelling.  Gastrointestinal: Negative for abdominal pain, anorexia and change in bowel habit.  Neurological: Negative for headaches and light-headedness.  Psychiatric/Behavioral: Negative for depression and substance abuse.  All other systems reviewed and are negative.     Objective  Blood pressure 131/75, pulse 92, temperature 98.7 F (37.1 C), height _0  (1.651 m), weight 143 lb 12.8 oz (65.2 kg), SpO2 99 %. Body mass index is 23.93 kg/m.   Physical Exam  Constitutional: She appears well-developed. No distress.  Moderately obese  HENT:  Head: Atraumatic.  Eyes: Conjunctivae are normal.  Neck: Neck supple. No JVD present. No thyromegaly present.  Cardiovascular: Normal rate, intact distal pulses and normal pulses. An irregular rhythm present. Exam reveals no gallop.  No murmur heard. Pulses:      Carotid pulses are on the right side with bruit and on the left side with bruit. S1 variable, S2 normal  Pulmonary/Chest: Effort normal and breath sounds normal.  Abdominal: Soft. Bowel sounds are normal.  obese  Musculoskeletal: Normal range of motion.  Neurological: She is alert.  Skin: Skin is warm and dry.  Psychiatric: She has a normal mood and affect.   Radiology: No results found.  Laboratory examination:   06/23/2017: Cholesterol 308, triglycerides 259, HDL 32, LDL 230.  Labs 03/21/2017: A1c 7.2%. Serum glucose 106 mg, BUN 14, creatinine 1.15, eGFR 80 mL, potassium 4.3. CMP otherwise normal. Total cholesterol 06/06/24, triglycerides 3 and 29, HDL 31, LDL 129.  CMP Latest Ref Rng & Units 07/27/2016  Glucose 65 - 99 mg/dL 87  BUN 6 - 20 mg/dL 18  Creatinine 0.44 - 1.00 mg/dL 0.95  Sodium 135 - 145 mmol/L 139  Potassium 3.5 - 5.1 mmol/L 3.9  Chloride 101 - 111 mmol/L 103  CO2 22 - 32 mmol/L 28  Calcium 8.9 - 10.3 mg/dL 9.6   CBC Latest Ref Rng & Units 07/27/2016   WBC 4.0 - 10.5 K/uL 6.9  Hemoglobin 12.0 - 15.0 g/dL 11.9(L)  Hematocrit 36.0 - 46.0 % 36.2  Platelets 150 - 400 K/uL 182   Lipid Panel  No results found for: CHOL, TRIG, HDL, CHOLHDL, VLDL, LDLCALC, LDLDIRECT HEMOGLOBIN A1C No results found for: HGBA1C, MPG TSH No results for input(s): TSH in the last 8760 hours.  PRN Meds:. Medications Discontinued During This Encounter  Medication Reason  . PAZEO 0.7 % SOLN Completed Course  . neomycin-polymyxin-hydrocortisone (CORTISPORIN) OTIC solution Completed Course   Current Meds  Medication Sig  . amLODipine (NORVASC) 5 MG tablet Take 1 tablet (5 mg total) by mouth daily.  Marland Kitchen apixaban (ELIQUIS) 5 MG TABS tablet Take 5 mg by mouth 2 (two) times daily.  Marland Kitchen  atorvastatin (LIPITOR) 40 MG tablet TAKE 1 TABLET DAILY  . Calcium-Vit D-Arg-Inos-Silicon (BONE DENSITY PO) Take by mouth See admin instructions. Take 1 tablet by mouth in the morning and take 2 tablets by mouth in the evening  . cephALEXin (KEFLEX) 500 MG capsule Take 500 mg by mouth 4 (four) times daily. 2000 mg prior to dental procedures  . Cholecalciferol (VITAMIN D) 2000 units tablet Take 2,000 Units by mouth daily.  Marland Kitchen losartan (COZAAR) 50 MG tablet Take 1 tablet by mouth daily.  . Multiple Vitamins-Calcium (ONE-A-DAY WOMENS PO) Take 1 tablet by mouth daily.  . Multiple Vitamins-Minerals (PRESERVISION AREDS PO) Take 1 capsule by mouth 2 (two) times daily.   . TOPROL XL 50 MG 24 hr tablet Take 50 mg by mouth daily.  . Tretinoin, Facial Wrinkles, (RENOVA PUMP) 0.02 % CREA Apply 1 application topically every other day.  . zaleplon (SONATA) 5 MG capsule Take 5 mg by mouth at bedtime as needed for sleep.  . [DISCONTINUED] PAZEO 0.7 % SOLN Place 1 drop into the right eye daily as needed for irritation.    Cardiac Studies:    Abdominal aortic duplex 02/11/2018: The maximum aorta diameter is 2.47 cm (prox) with mild ectasia. Calcific plaque noted in the abdominal aorta.  Normal flow  velocities noted in the bilateral iliac arteries. Compared to 05/24/2015, Maximum aortid dilatation was 2.92. F/U studies if clinically indicated.  Abdominal Ultrasound 02/11/2018: The maximum aorta diameter is 2.47 cm (prox) with mild ectasia. Calcific plaque noted in the abdominal aorta.  Normal flow velocities noted in the bilateral iliac arteries. Compared to 05/24/2015, Maximum aortid dilatation was 2.92. F/U studies if clinically indicated.  Successful cardioversion 07/29/2017, 09/10/2016.   Echocardiogram 03/25/2017: Left ventricle cavity is normal in size. Normal global wall motion. Grade II diastolic dysfunction. Elevated LVEDP. Calculated EF 55%. Mild biatrial dilatation.  Mild to moderate aortic, mitral, and tricuspid regurgitation. Mild pulmonic regurgitation. Elevated central venous pressure. Estimated pulmonary artery systolic pressure 48 mmHg. Compared to prior study dated 08/15/2016, dominant rhythm is now sinus. Pulmonary hypertension has progressed.  Carotid artery duplex 03/25/2017: Stenosis in the right internal carotid artery (16-49%). Antegrade right vertebral artery flow. Bidirectional left vertebral artery flow suggests left subclavian artery stenosis. Follow up in one year is appropriate if clinically indicated. Compared to 07/18/2016, right ICA stenosis  new. Patient has known subclavian stenosis.  Lexiscan myoview stress test: 03/03/2017:                                                                        1. The exercise tolerance was not assessed due to pharmacologic stress. Stress EKG Test Results Normal.     2. Normal SPECT study with normal left ventricular systolic function Low risk study. Normal left ventricular systolic function. No scar or ischemia detected by nuclear imaging.  Event Monitor: Episodic transmission 02/20/2017 reveals 4 beat run of NSVT at 12:20 a.m. Unscheduled transmission 03/01/17: 4.4 second ventricular standstill at 5am. Rhythm  converted from atypical A. Flutter to NSR. No change in therapy for now, continue to observe  Subclavian artery duplex 07/18/2016: Left subclavian artery is abnormal, monophasic, suggestive of proximal obstruction. Left vertebral artery waveform shows high resistance. Antegrade right vertebral artery flow.  Blood Pressure Right: 155/80   Left: 135/75 No hemodynamically significant arterial disease in the common carotid artery bilaterally. Compared to the study done on 05/30/2015, left subclavian artery monophasic waveform now well demonstrated.  Otherwise no significant change.  Lower extremity arterial duplex 05/24/2015:  No hemodynamically significant stenoses are identified in bilateral lower extremity arterial system. Mild plaque noted.  This exam reveals normal perfusion of both  the  lower extremities with RABI >1.14 and LABI 1.07.  Sleep Study 2006: Normal.  Assessment     ICD-10-CM   1. Paroxysmal atrial fibrillation (HCC) CHA2DS2-VASc Score is 4. Yearly risk of stroke: 4;  I48.0 EKG 12-Lead  2. Atypical atrial flutter (HCC)  I48.4   3. Essential hypertension  I10   4. Hypercholesteremia  E78.00   5. Bilateral carotid bruits  R09.89    EKG 11/27/2018: Atypical atrial flutter with variable AV conduction and controlled ventricular response at the rate of 78 bpm, normal axis, IVCD, nonspecific T abnormality.   PCP EKG 06/02/2018: Atrial flutter 3:1 conduction with controlled ventricular response at 80 bpm, normal axis, no evidence of ischemia.  Recommendations:   Patient with paroxysmal atrial flutter/atrial fibrillation, presently on long-term anticoagulation with Eliquis, hypertension, hyperlipidemia, markedly elevated LDL now on aggressive medical therapy, LDL has normalized.    She has had mild right carotid asymptomatic stenosis by cardiac evaluation in the past presents here for a two-month office visit.  I'd seen her on a virtual visit. She has fairly prominent carotid  bruit, left is greater than the right, suspect progression of carotid disease.  We'll repeat carotid artery duplex.  She is essentially asymptomatic except for occasional episodes of dizziness, advised her to take the losartan in the evening than in the morning, she will continue with amlodipine and metoprolol in the morning.  I also advised her to follow-up with Dr. Dagmar Hait her to lipid management.  I'll see her back in 6 months or sooner if problems. She is asymptomatic with regards to AF/AFL, has paroxysmal episodes and was in atypical flutter today.  Continue anticoagulation.   Adrian Prows, MD, [facility] 11/27/2018, 12:05 PM [facility] [place]  Pager: [phone] Office: [phone] If no answer Cell [phone]

## 2018-12-08 DIAGNOSIS — I480 Paroxysmal atrial fibrillation: Secondary | ICD-10-CM | POA: Diagnosis not present

## 2018-12-08 DIAGNOSIS — I129 Hypertensive chronic kidney disease with stage 1 through stage 4 chronic kidney disease, or unspecified chronic kidney disease: Secondary | ICD-10-CM | POA: Insufficient documentation

## 2018-12-08 DIAGNOSIS — E785 Hyperlipidemia, unspecified: Secondary | ICD-10-CM | POA: Diagnosis not present

## 2018-12-08 DIAGNOSIS — N183 Chronic kidney disease, stage 3 (moderate): Secondary | ICD-10-CM | POA: Diagnosis not present

## 2018-12-08 DIAGNOSIS — I519 Heart disease, unspecified: Secondary | ICD-10-CM | POA: Diagnosis not present

## 2018-12-08 DIAGNOSIS — I6529 Occlusion and stenosis of unspecified carotid artery: Secondary | ICD-10-CM | POA: Diagnosis not present

## 2018-12-08 DIAGNOSIS — I2510 Atherosclerotic heart disease of native coronary artery without angina pectoris: Secondary | ICD-10-CM | POA: Diagnosis not present

## 2018-12-14 DIAGNOSIS — I119 Hypertensive heart disease without heart failure: Secondary | ICD-10-CM | POA: Insufficient documentation

## 2018-12-14 DIAGNOSIS — I10 Essential (primary) hypertension: Secondary | ICD-10-CM | POA: Insufficient documentation

## 2018-12-14 DIAGNOSIS — E785 Hyperlipidemia, unspecified: Secondary | ICD-10-CM | POA: Diagnosis not present

## 2018-12-14 DIAGNOSIS — I480 Paroxysmal atrial fibrillation: Secondary | ICD-10-CM | POA: Diagnosis not present

## 2019-01-01 ENCOUNTER — Ambulatory Visit: Payer: Medicare HMO | Admitting: Podiatry

## 2019-01-01 ENCOUNTER — Encounter: Payer: Self-pay | Admitting: Podiatry

## 2019-01-01 ENCOUNTER — Other Ambulatory Visit: Payer: Self-pay

## 2019-01-01 DIAGNOSIS — Z9229 Personal history of other drug therapy: Secondary | ICD-10-CM

## 2019-01-01 DIAGNOSIS — M79675 Pain in left toe(s): Secondary | ICD-10-CM

## 2019-01-01 DIAGNOSIS — B351 Tinea unguium: Secondary | ICD-10-CM | POA: Diagnosis not present

## 2019-01-01 DIAGNOSIS — M79674 Pain in right toe(s): Secondary | ICD-10-CM

## 2019-01-01 NOTE — Patient Instructions (Signed)

## 2019-01-07 ENCOUNTER — Encounter: Payer: Self-pay | Admitting: Podiatry

## 2019-01-07 ENCOUNTER — Other Ambulatory Visit: Payer: Self-pay

## 2019-01-07 ENCOUNTER — Other Ambulatory Visit: Payer: Self-pay | Admitting: Podiatry

## 2019-01-07 ENCOUNTER — Ambulatory Visit (INDEPENDENT_AMBULATORY_CARE_PROVIDER_SITE_OTHER): Payer: Medicare HMO

## 2019-01-07 ENCOUNTER — Ambulatory Visit: Payer: Medicare HMO | Admitting: Podiatry

## 2019-01-07 DIAGNOSIS — M76821 Posterior tibial tendinitis, right leg: Secondary | ICD-10-CM | POA: Diagnosis not present

## 2019-01-07 DIAGNOSIS — M25571 Pain in right ankle and joints of right foot: Secondary | ICD-10-CM

## 2019-01-08 NOTE — Progress Notes (Signed)
Subjective:   Patient ID: Kimberly Villegas, female   DOB: 84 y.o.   MRN: [id number]   HPI Patient presents stating the inside of the ankle has started to hurt over the last couple months and its been hard for her to walk and she is not sure if she did something to it   ROS      Objective:  Physical Exam  Neurovascular status intact with inflammation of the posterior tibial tendon right as it comes underneath the medial malleolus and inserts into the navicular with no indications of tendon dysfunction or tear     Assessment:  Probability for inflammatory tendinitis posterior tibial tendon right     Plan:  H&P x-ray reviewed at today I did careful sheath injection posterior tib given instructions on reduced activity.  Patient will be seen back for [place] to recheck again in the next few weeks and was given instructions on reduced activity supportive shoes  X-rays indicate that there is no signs of depression of the arch or bone pathology associated with this condition

## 2019-01-10 NOTE — Progress Notes (Signed)
Subjective: Kimberly Villegas is a 84 y.o. y.o. female who is on long term blood thinner Eliquis,  presents today with painful, discolored, thick toenails 1, 3, 4, 5 b/l which interfere with daily activities. Pain is aggravated when wearing enclosed shoe gear. Pain is relieved with periodic professional debridement.  She is s/p 2nd toe amputation b/l.    Current Outpatient Medications:  .  amLODipine (NORVASC) 5 MG tablet, Take 1 tablet (5 mg total) by mouth daily., Disp: 90 tablet, Rfl: 1 .  apixaban (ELIQUIS) 5 MG TABS tablet, Take 5 mg by mouth 2 (two) times daily., Disp: , Rfl:  .  atorvastatin (LIPITOR) 40 MG tablet, TAKE 1 TABLET DAILY, Disp: 90 tablet, Rfl: 3 .  Calcium-Vit D-Arg-Inos-Silicon (BONE DENSITY PO), Take by mouth See admin instructions. Take 1 tablet by mouth in the morning and take 2 tablets by mouth in the evening, Disp: , Rfl:  .  cephALEXin (KEFLEX) 500 MG capsule, Take 500 mg by mouth 4 (four) times daily. 2000 mg prior to dental procedures, Disp: , Rfl:  .  Cholecalciferol (VITAMIN D) 2000 units tablet, Take 2,000 Units by mouth daily., Disp: , Rfl:  .  losartan (COZAAR) 50 MG tablet, Take 1 tablet by mouth daily., Disp: , Rfl:  .  Multiple Vitamins-Calcium (ONE-A-DAY WOMENS PO), Take 1 tablet by mouth daily., Disp: , Rfl:  .  Multiple Vitamins-Minerals (PRESERVISION AREDS PO), Take 1 capsule by mouth 2 (two) times daily. , Disp: , Rfl:  .  TOPROL XL 50 MG 24 hr tablet, Take 50 mg by mouth daily., Disp: , Rfl:  .  Tretinoin, Facial Wrinkles, (RENOVA PUMP) 0.02 % CREA, Apply 1 application topically every other day., Disp: , Rfl:  .  zaleplon (SONATA) 5 MG capsule, Take 5 mg by mouth at bedtime as needed for sleep., Disp: , Rfl:    No Known Allergies   Objective: There were no vitals filed for this visit.  Vascular Examination: Capillary refill time immediate x 10 digits.  Dorsalis pedis pulses palpable b/l.  Posterior tibial pulses palpable b/l.  No digital  hair x 10 digits.   Skin temperature gradient WNL b/l.  Dermatological Examination: Skin with normal turgor, texture and tone b/l.  Toenails 1, 3, 4, 5 b/l discolored, thick, dystrophic with subungual debris and pain with palpation to nailbeds due to thickness of nails.  Evidence of permanent partial nail avulsions lateral borders b/l hallux, healed.   Musculoskeletal: Muscle strength 5/5 to all LE muscle groups.  Status post 2nd digit amputation b/l.  Neurological: Sensation intact with 10 gram monofilament.  Vibratory sensation intact.  Assessment: Painful onychomycosis toenails 1, 3, 4, 5 b/l in patient on blood thinner S/p 2nd digit amputation b/l  Plan: 1. Toenails 1, 3, 4, 5 b/l were debrided in length and girth without iatrogenic bleeding. 2. Patient to continue soft, supportive shoe gear daily. 3. Patient to report any pedal injuries to medical professional immediately. 4. Avoid self trimming due to use of blood thinner. 5. Follow up 9 weeks.  6. Patient/POA to call should there be a concern in the interim.

## 2019-01-18 DIAGNOSIS — H353131 Nonexudative age-related macular degeneration, bilateral, early dry stage: Secondary | ICD-10-CM | POA: Diagnosis not present

## 2019-01-18 DIAGNOSIS — H04123 Dry eye syndrome of bilateral lacrimal glands: Secondary | ICD-10-CM | POA: Diagnosis not present

## 2019-01-18 DIAGNOSIS — H25813 Combined forms of age-related cataract, bilateral: Secondary | ICD-10-CM | POA: Diagnosis not present

## 2019-01-23 DIAGNOSIS — R69 Illness, unspecified: Secondary | ICD-10-CM | POA: Diagnosis not present

## 2019-02-17 ENCOUNTER — Other Ambulatory Visit: Payer: Self-pay

## 2019-02-17 ENCOUNTER — Encounter: Payer: Self-pay | Admitting: Podiatry

## 2019-02-17 ENCOUNTER — Ambulatory Visit: Payer: Medicare HMO | Admitting: Podiatry

## 2019-02-17 DIAGNOSIS — M76821 Posterior tibial tendinitis, right leg: Secondary | ICD-10-CM

## 2019-02-17 DIAGNOSIS — M25571 Pain in right ankle and joints of right foot: Secondary | ICD-10-CM | POA: Diagnosis not present

## 2019-02-22 NOTE — Progress Notes (Signed)
Subjective:   Patient ID: Kimberly Villegas, female   DOB: 84 y.o.   MRN: [id number]   HPI Patient states she is improved but still having discomfort when she walks and states that she feel like her foot is not supported properly and there is still a lot of swelling in her foot and ankle   ROS      Objective:  Physical Exam  Neurovascular status intact with continued moderate collapse of the medial longitudinal arch right with inflammation pain of the posterior tibial tendon as it comes underneath the malleolus     Assessment:  Posterior tibial tendinitis right which has improved but is still quite sore with palpation and activity levels     Plan:  Reviewed condition discussed continued conservative treatment and went ahead dispensed an ankle compression stocking to help with's swelling and a AFO type ankle brace to lift the arch to try to reduce the stress on the posterior tibial tendon.  Discussed the utilization of supportive shoes and will be seen back as needed

## 2019-03-05 ENCOUNTER — Other Ambulatory Visit: Payer: Self-pay | Admitting: Cardiology

## 2019-03-05 DIAGNOSIS — I10 Essential (primary) hypertension: Secondary | ICD-10-CM

## 2019-03-15 ENCOUNTER — Encounter: Payer: Self-pay | Admitting: Podiatry

## 2019-03-15 ENCOUNTER — Other Ambulatory Visit: Payer: Self-pay

## 2019-03-15 ENCOUNTER — Ambulatory Visit (INDEPENDENT_AMBULATORY_CARE_PROVIDER_SITE_OTHER): Payer: Medicare HMO | Admitting: Podiatry

## 2019-03-15 DIAGNOSIS — M79675 Pain in left toe(s): Secondary | ICD-10-CM

## 2019-03-15 DIAGNOSIS — M79674 Pain in right toe(s): Secondary | ICD-10-CM | POA: Diagnosis not present

## 2019-03-15 DIAGNOSIS — B351 Tinea unguium: Secondary | ICD-10-CM

## 2019-03-17 ENCOUNTER — Encounter: Payer: Self-pay | Admitting: Podiatry

## 2019-03-17 ENCOUNTER — Ambulatory Visit: Payer: Medicare HMO | Admitting: Podiatry

## 2019-03-17 ENCOUNTER — Other Ambulatory Visit: Payer: Self-pay

## 2019-03-17 DIAGNOSIS — M76821 Posterior tibial tendinitis, right leg: Secondary | ICD-10-CM

## 2019-03-17 DIAGNOSIS — M1000 Idiopathic gout, unspecified site: Secondary | ICD-10-CM

## 2019-03-17 NOTE — Progress Notes (Signed)
Subjective:   Patient ID: Kimberly Villegas, female   DOB: 84 y.o.   MRN: [id number]   HPI Patient has started to develop inflammation which started several days ago on the right ankle and she does not remember specific injury but states it is been sore   ROS      Objective:  Physical Exam  There is redness on the medial side of the right ankle with no opening or drainage noted that is inflamed and appears to be around the posterior tibial tendon as it comes under the medial malleolus with no indications of tear of the posterior tibial tendon     Assessment:  Appears to be an inflammatory condition and may be related to systemic gout or arthritis versus localized condition     Plan:  H&P reviewed condition and did careful steroid injection of the medial ankle 3 mg Dexasone Kenalog 5 mg Xylocaine and I am sending for blood work consisting of arthritic profile and CBC with differential.  Patient will be seen back 1 week and was instructed to call us if any issues were to occur

## 2019-03-17 NOTE — Progress Notes (Signed)
Subjective: Kimberly Villegas is seen today for follow up painful, elongated, thickened toenails b/l feet that she cannot cut. Pain interferes with daily activities. Aggravating factor includes wearing enclosed shoe gear and relieved with periodic debridement.  She states she is continuing to have right ankle pain and is scheduled to see Dr. Paulla Dolly on Wednesday. She had a bad night last night. She is wearing her plantar fascial brace and compression sock.   Current Outpatient Medications on File Prior to Visit  Medication Sig  . amLODipine (NORVASC) 5 MG tablet TAKE 1 TABLET(5 MG) BY MOUTH DAILY  . apixaban (ELIQUIS) 5 MG TABS tablet Take 5 mg by mouth 2 (two) times daily.  Marland Kitchen atorvastatin (LIPITOR) 40 MG tablet TAKE 1 TABLET DAILY  . Calcium-Vit D-Arg-Inos-Silicon (BONE DENSITY PO) Take by mouth See admin instructions. Take 1 tablet by mouth in the morning and take 2 tablets by mouth in the evening  . cephALEXin (KEFLEX) 500 MG capsule Take 500 mg by mouth 4 (four) times daily. 2000 mg prior to dental procedures  . Cholecalciferol (VITAMIN D) 2000 units tablet Take 2,000 Units by mouth daily.  Marland Kitchen losartan (COZAAR) 50 MG tablet Take 1 tablet by mouth daily.  . Multiple Vitamins-Calcium (ONE-A-DAY WOMENS PO) Take 1 tablet by mouth daily.  . Multiple Vitamins-Minerals (PRESERVISION AREDS PO) Take 1 capsule by mouth 2 (two) times daily.   . TOPROL XL 50 MG 24 hr tablet Take 50 mg by mouth daily.  . Tretinoin, Facial Wrinkles, (RENOVA PUMP) 0.02 % CREA Apply 1 application topically every other day.  . zaleplon (SONATA) 5 MG capsule Take 5 mg by mouth at bedtime as needed for sleep.   No current facility-administered medications on file prior to visit.      No Known Allergies   Objective:  Vascular Examination: Capillary refill time immediate x 10 digits.  Dorsalis pedis and Posterior tibial pulses present b/l.  Digital hair absent b/l.  Skin temperature gradient WNL b/l.   Dermatological  Examination: Skin with normal turgor, texture and tone b/l.  Toenails 1, 3, 4, 5 b/l discolored, thick, dystrophic with subungual debris and pain with palpation to nailbeds due to thickness of nails.  Evidence of permanent partial nail avulsions b/l hallux.   Musculoskeletal: Muscle strength 5/5 to all LE muscle groups  Amputation 2nd digit b/l.  No pain, crepitus or joint limitation noted with ROM.   Neurological Examination: Protective sensation intact with 10 gram monofilament bilaterally.  Epicritic sensation present bilaterally.  Vibratory sensation intact bilaterally.   Assessment: Painful onychomycosis toenails 1, 3, 4, 5 b/l  PT tendonitis right LE (followed by Dr. Paulla Dolly)  Plan: 1. Toenails 1, 3, 4, 5 b/l  were debrided in length and girth without iatrogenic bleeding. 2. She was instructed to try icing right ankle 10-15 minutes/day to assist with inflammation of right ankle until she sees Dr. Paulla Dolly.  3. Patient to continue soft, supportive shoe gear 4. Patient to report any pedal injuries to medical professional immediately. 5. Follow up 9 weeks for routine nail care for painful onychomycosis. 6. Patient/POA to call should there be a concern in the interim.

## 2019-03-19 LAB — CBC WITH DIFFERENTIAL/PLATELET
Absolute Monocytes: 872 cells/uL (ref 200–950)
Basophils Absolute: 20 cells/uL (ref 0–200)
Basophils Relative: 0.2 %
Eosinophils Absolute: 0 cells/uL — ABNORMAL LOW (ref 15–500)
Eosinophils Relative: 0 %
HCT: 33.3 % — ABNORMAL LOW (ref 35.0–45.0)
Hemoglobin: 11 g/dL — ABNORMAL LOW (ref 11.7–15.5)
Lymphs Abs: 1548 cells/uL (ref 850–3900)
MCH: 32.7 pg (ref 27.0–33.0)
MCHC: 33 g/dL (ref 32.0–36.0)
MCV: 99.1 fL (ref 80.0–100.0)
MPV: 10.2 fL (ref 7.5–12.5)
Monocytes Relative: 8.9 %
Neutro Abs: 7360 cells/uL (ref 1500–7800)
Neutrophils Relative %: 75.1 %
Platelets: 175 10*3/uL (ref 140–400)
RBC: 3.36 10*6/uL — ABNORMAL LOW (ref 3.80–5.10)
RDW: 12.1 % (ref 11.0–15.0)
Total Lymphocyte: 15.8 %
WBC: 9.8 10*3/uL (ref 3.8–10.8)

## 2019-03-19 LAB — ANA, IFA COMPREHENSIVE PANEL
Anti Nuclear Antibody (ANA): NEGATIVE
ENA SM Ab Ser-aCnc: <1 AI
SM/RNP: <1 AI
SSA (Ro) (ENA) Antibody, IgG: <1 AI
SSB (La) (ENA) Antibody, IgG: <1 AI
Scleroderma (Scl-70) (ENA) Antibody, IgG: <1 AI
ds DNA Ab: 1 IU/mL

## 2019-03-19 LAB — URIC ACID: Uric Acid, Serum: 2.6 mg/dL (ref 2.5–7.0)

## 2019-03-19 LAB — C-REACTIVE PROTEIN: CRP: 70.5 mg/L — ABNORMAL HIGH (ref <8.0)

## 2019-03-19 LAB — SEDIMENTATION RATE: Sed Rate: 101 mm/h — ABNORMAL HIGH (ref 0–30)

## 2019-03-19 LAB — RHEUMATOID FACTOR: Rhuematoid fact SerPl-aCnc: <14 IU/mL (ref <14)

## 2019-03-25 ENCOUNTER — Other Ambulatory Visit: Payer: Self-pay

## 2019-03-25 ENCOUNTER — Ambulatory Visit: Payer: Medicare HMO | Admitting: Podiatry

## 2019-03-25 ENCOUNTER — Encounter: Payer: Self-pay | Admitting: Podiatry

## 2019-03-25 DIAGNOSIS — M1000 Idiopathic gout, unspecified site: Secondary | ICD-10-CM | POA: Diagnosis not present

## 2019-03-25 DIAGNOSIS — M25571 Pain in right ankle and joints of right foot: Secondary | ICD-10-CM

## 2019-03-25 DIAGNOSIS — M76821 Posterior tibial tendinitis, right leg: Secondary | ICD-10-CM | POA: Diagnosis not present

## 2019-03-29 NOTE — Progress Notes (Signed)
Subjective:   Patient ID: Kimberly Villegas, female   DOB: 84 y.o.   MRN: [id number]   HPI Patient states her foot feels a lot better but she still concerned about what she had and why and she is hoping this does not recur   ROS      Objective:  Physical Exam  Neurovascular status intact with significant diminishment of redness erythema around the medial side of the right ankle with moderate depression of the arch still noted in blood work which needs to be reviewed     Assessment:  Acute inflammatory condition with significant systemic findings associated with systemic inflammation associated with medial right ankle     Plan:  H&P reviewed condition at great length.  I discussed the elevated sed rate and C-reactive protein and the significant increase that I noted.  At this point I have recommended that we take a very close eye on this watch it and I want patient to see family physician and should have these numbers rechecked again in approximately 3 months.  If she gets another attack she will see me and if she is feeling really well she will hold off and will be reevaluated and see whether or not C-reactive protein and sed rate have reduced

## 2019-04-20 DIAGNOSIS — I119 Hypertensive heart disease without heart failure: Secondary | ICD-10-CM | POA: Diagnosis not present

## 2019-04-20 DIAGNOSIS — N1831 Chronic kidney disease, stage 3a: Secondary | ICD-10-CM | POA: Diagnosis not present

## 2019-04-20 DIAGNOSIS — M25571 Pain in right ankle and joints of right foot: Secondary | ICD-10-CM | POA: Insufficient documentation

## 2019-04-20 DIAGNOSIS — E785 Hyperlipidemia, unspecified: Secondary | ICD-10-CM | POA: Diagnosis not present

## 2019-04-20 DIAGNOSIS — I480 Paroxysmal atrial fibrillation: Secondary | ICD-10-CM | POA: Diagnosis not present

## 2019-04-22 ENCOUNTER — Other Ambulatory Visit: Payer: Self-pay

## 2019-04-22 ENCOUNTER — Ambulatory Visit: Payer: Medicare HMO | Admitting: Podiatry

## 2019-04-22 ENCOUNTER — Encounter: Payer: Self-pay | Admitting: Podiatry

## 2019-04-22 DIAGNOSIS — M76821 Posterior tibial tendinitis, right leg: Secondary | ICD-10-CM

## 2019-04-22 DIAGNOSIS — M1000 Idiopathic gout, unspecified site: Secondary | ICD-10-CM

## 2019-04-26 NOTE — Progress Notes (Signed)
Subjective:   Patient ID: Kimberly Villegas, female   DOB: 84 y.o.   MRN: [id number]   HPI Patient states she was doing fine and then had a flareup on Saturday stating it is not as bad as the first flareup but it is quite sore   ROS      Objective:  Physical Exam  Neurovascular status intact with significant systemic indications of inflammatory condition that did well and now has reoccurred and she is desperate for some kind of short-term relief as she cannot see a rheumatologist yet     Assessment:  Inflammatory condition right posterior tib and into the ankle region right with inflammation with a lower grade than previous but present      Plan:  H&P reviewed condition and at this point I have recommended 1 more injection and I did discuss blood work indicating some form of systemic inflammatory condition.  I did explain to the patient that ultimately I do want her see a rheumatologist and she is scheduled for appointment to see 1 and at this point due to the holidays coming up the fact she needs to be on her foot I did do sterile prep and injected the inflammatory area 3 mg Dexasone 5 mg Xylocaine and advised on reduced activity

## 2019-05-24 ENCOUNTER — Ambulatory Visit (INDEPENDENT_AMBULATORY_CARE_PROVIDER_SITE_OTHER): Payer: Self-pay

## 2019-05-24 ENCOUNTER — Other Ambulatory Visit: Payer: Self-pay

## 2019-05-24 DIAGNOSIS — I6521 Occlusion and stenosis of right carotid artery: Secondary | ICD-10-CM | POA: Diagnosis not present

## 2019-05-24 DIAGNOSIS — R0989 Other specified symptoms and signs involving the circulatory and respiratory systems: Secondary | ICD-10-CM | POA: Diagnosis not present

## 2019-05-26 ENCOUNTER — Ambulatory Visit: Payer: Medicare HMO

## 2019-05-30 ENCOUNTER — Other Ambulatory Visit: Payer: Self-pay | Admitting: Cardiology

## 2019-05-30 DIAGNOSIS — R0989 Other specified symptoms and signs involving the circulatory and respiratory systems: Secondary | ICD-10-CM

## 2019-05-30 DIAGNOSIS — I6521 Occlusion and stenosis of right carotid artery: Secondary | ICD-10-CM

## 2019-05-31 ENCOUNTER — Other Ambulatory Visit: Payer: Self-pay

## 2019-05-31 ENCOUNTER — Ambulatory Visit: Payer: Self-pay | Admitting: Cardiology

## 2019-05-31 ENCOUNTER — Encounter: Payer: Self-pay | Admitting: Cardiology

## 2019-05-31 VITALS — BP 99/37 | HR 73 | Temp 97.1°F | Ht 65.0 in | Wt 133.6 lb

## 2019-05-31 DIAGNOSIS — I6521 Occlusion and stenosis of right carotid artery: Secondary | ICD-10-CM | POA: Diagnosis not present

## 2019-05-31 DIAGNOSIS — E7800 Pure hypercholesterolemia, unspecified: Secondary | ICD-10-CM | POA: Diagnosis not present

## 2019-05-31 DIAGNOSIS — I10 Essential (primary) hypertension: Secondary | ICD-10-CM

## 2019-05-31 DIAGNOSIS — I480 Paroxysmal atrial fibrillation: Secondary | ICD-10-CM | POA: Diagnosis not present

## 2019-05-31 NOTE — Progress Notes (Signed)
Primary Physician/Referring:  Prince Solian, MD  Patient ID: Kimberly Villegas, female    DOB: [birth year], 85 y.o.   MRN: [id number]  Chief Complaint  Patient presents with  . Atrial Flutter  . carotid stenosis  . Follow-up    39mo  HPI:    Kimberly Villegas is a 85y.o. female  with paroxysmal atrial fibrillation, s/p cardioversion on 07/29/17, 09/2016. She also has mild to moderate aortic and mitral regurgitation and very mild asymptomatic left subclavian artery stenosis, right carotid stenosis, hypertension, and hyperlipidemia.  Due to recurrence of atrial fibrillation in spite of being on amiodarone, this was discontinued.  This is her annual visit, states that she is doing well and has not had any further palpitations, denies chest pain or shortness of breath.  No bleeding diathesis on Eliquis.  Past Medical History:  Diagnosis Date  . A-fib ([facility]   . Anemia, chronic disease   . Arthritis   . Asthma   . Bilateral carotid bruits   . CAD (coronary artery disease)   . Cervical stenosis of spine   . Cough   . Diastolic dysfunction   . DVT (deep venous thrombosis) ([facility]    after vein stripping years ago  . GERD (gastroesophageal reflux disease)   . Hyperlipemia   . Hypertension   . Insomnia   . New onset a[place]fib ([facility] [place] 2018  . Seasonal allergies   . Syncope   . Vitamin D deficiency    Past Surgical History:  Procedure Laterality Date  . AMPUTATION TOE Bilateral 07/25/2016   Procedure: bilateral 2nd toe amputations;  Surgeon: JWylene Simmer MD;  [facility] [facility]  Service: Orthopedics;  Laterality: Bilateral;  . BASAL CELL CARCINOMA EXCISION    . CARDIOVERSION N/A 09/10/2016   Procedure: CARDIOVERSION;  Surgeon: GAdrian Prows MD;  [facility] [facility]  Service: Cardiovascular;  Laterality: N/A;  . CARDIOVERSION N/A 07/29/2017   Procedure: CARDIOVERSION;  Surgeon: PNigel Mormon MD;  [place] [facility]  Service: Cardiovascular;  Laterality:  N/A;  . FOOT SURGERY Left   . JOINT REPLACEMENT Right   . VEIN LIGATION AND STRIPPING Right    Social History   Tobacco Use  . Smoking status: Never Smoker  . Smokeless tobacco: Never Used  Substance Use Topics  . Alcohol use: Yes    Alcohol/week: 1.0 standard drinks    Types: 1 Glasses of wine per week    Comment: occa    ROS  Review of Systems  Constitution: Positive for weight loss (itentional). Negative for weight gain.  Cardiovascular: Negative for dyspnea on exertion, leg swelling and syncope.  Respiratory: Negative for hemoptysis.   Endocrine: Negative for cold intolerance.  Hematologic/Lymphatic: Does not bruise/bleed easily.  Gastrointestinal: Negative for hematochezia and melena.  Neurological: Negative for headaches and light-headedness.   Objective  Blood pressure (!) 99/37, pulse 73, temperature (!) 97.1 F (36.2 C), height '5\' 5"'$  (1.651 m), weight 133 lb 9.6 oz (60.6 kg), SpO2 97 %.  Vitals with BMI 05/31/2019 11/27/2018 10/02/2018  Height '5\' 5"'$  '5\' 5"'$  -  Weight 133 lbs 10 oz 143 lbs 13 oz -  BMI 254.98226.41-  Systolic 99 [id number] Diastolic 37 75 78  Pulse 73 92 83     Physical Exam  Neck: No thyromegaly present.  Cardiovascular: Normal rate, regular rhythm and intact distal pulses. Exam reveals no gallop.  Murmur heard.  Blowing midsystolic murmur is present with a grade of 2/6  at the lower left sternal border. No leg edema, no JVD.  Pulmonary/Chest: Effort normal and breath sounds normal.  Abdominal: Soft. Bowel sounds are normal.  Musculoskeletal:     Cervical back: Neck supple.  Skin: Skin is warm and dry.   Laboratory examination:   No results for input(s): NA, K, CL, CO2, GLUCOSE, BUN, CREATININE, CALCIUM, GFRNONAA, GFRAA in the last 8760 hours. CrCl cannot be calculated (Patient's most recent lab result is older than the maximum 21 days allowed.).  CMP Latest Ref Rng & Units 07/27/2016  Glucose 65 - 99 mg/dL 87  BUN 6 - 20 mg/dL 18  Creatinine  0.44 - 1.00 mg/dL 0.95  Sodium 135 - 145 mmol/L 139  Potassium 3.5 - 5.1 mmol/L 3.9  Chloride 101 - 111 mmol/L 103  CO2 22 - 32 mmol/L 28  Calcium 8.9 - 10.3 mg/dL 9.6   CBC Latest Ref Rng & Units 03/17/2019 07/27/2016  WBC 3.8 - 10.8 Thousand/uL 9.8 6.9  Hemoglobin 11.7 - 15.5 g/dL 11.0(L) 11.9(L)  Hematocrit 35.0 - 45.0 % 33.3(L) 36.2  Platelets 140 - 400 Thousand/uL 175 182   Lipid Panel  No results found for: CHOL, TRIG, HDL, CHOLHDL, VLDL, LDLCALC, LDLDIRECT HEMOGLOBIN A1C No results found for: HGBA1C, MPG TSH No results for input(s): TSH in the last 8760 hours.  External labs:  06/23/2017: Cholesterol 308, triglycerides 259, HDL 32, LDL 230.  Labs 03/21/2017: A1c 7.2%. Serum glucose 106 mg, BUN 14, creatinine 1.15, eGFR 80 mL, potassium 4.3. CMP otherwise normal. Total cholesterol 06/06/24, triglycerides 3 and 29, HDL 31, LDL 129.    12/14/2018 Cholesterol, total 139.000 m 12/14/2018 HDL 54 MG/DL 12/14/2018 LDL 73.000 mg 12/14/2018 Triglycerides 62.000 12/14/2018 Hemoglobin 11.000 g/ 04/20/2019 Creatinine, Serum 0.900 mg/ 04/20/2019 Potassium 3.900 07/27/2016 ALT (SGPT) 11.000 uni 12/14/2018 TSH 2.820 05/26/2018 Platelets 175.000 03/17/2019  Medications and allergies  No Known Allergies   Current Outpatient Medications  Medication Instructions  . amLODipine (NORVASC) 5 MG tablet TAKE 1 TABLET(5 MG) BY MOUTH DAILY  . apixaban (ELIQUIS) 5 mg, Oral, 2 times daily  . atorvastatin (LIPITOR) 40 MG tablet TAKE 1 TABLET DAILY  . Calcium-Vit D-Arg-Inos-Silicon (BONE DENSITY PO) Oral, See admin instructions, Take 1 tablet by mouth in the morning and take 2 tablets by mouth in the evening  . cephALEXin (KEFLEX) 500 mg, Oral, 4 times daily, 2000 mg prior to dental procedures   . losartan (COZAAR) 50 MG tablet 1 tablet, Oral, Daily  . Multiple Vitamins-Calcium (ONE-A-DAY WOMENS PO) 1 tablet, Oral, Daily  . Multiple Vitamins-Minerals (PRESERVISION AREDS PO) 1 capsule, Oral, 2 times daily   . Toprol XL 50 mg, Oral, Daily  . Tretinoin, Facial Wrinkles, (RENOVA PUMP) 1.76 % CREA 1 application, Apply externally, Every other day  . Vitamin D 2,000 Units, Oral, Daily  . zaleplon (SONATA) 5 mg, Oral, At bedtime PRN    Radiology:  No results found.  Cardiac Studies:   Sleep Study 2006: Normal.  Lower extremity arterial duplex 05/24/2015:  No hemodynamically significant stenoses are identified in bilateral lower extremity arterial system. Mild plaque noted.  This exam reveals normal perfusion of both  the  lower extremities with RABI >1.14 and LABI 1.07.  Abdominal Ultrasound 02/11/2018: The maximum aorta diameter is 2.47 cm (prox) with mild ectasia. Calcific plaque noted in the abdominal aorta.  Normal flow velocities noted in the bilateral iliac arteries. Compared to 05/24/2015, Maximum aortid dilatation was 2.92. F/U studies if clinically indicated.  Successful cardioversion 07/29/2017, 09/10/2016.  Subclavian artery duplex 07/18/2016: Left subclavian artery is abnormal, monophasic, suggestive of proximal obstruction. Left vertebral artery waveform shows high resistance. Antegrade right vertebral artery flow.  Blood Pressure Right: 155/80   Left: 135/75 No hemodynamically significant arterial disease in the common carotid artery bilaterally. Compared to the study done on 05/30/2015, left subclavian artery monophasic waveform now well demonstrated.  Otherwise no significant change.  Abdominal aortic duplex 02/11/2018: The maximum aorta diameter is 2.47 cm (prox) with mild ectasia. Calcific plaque noted in the abdominal aorta.  Normal flow velocities noted in the bilateral iliac arteries. Compared to 05/24/2015, Maximum aortid dilatation was 2.92. F/U studies if clinically indicated.  Event Monitor: Episodic transmission 02/20/2017 reveals 4 beat run of NSVT at 12:20 a.m. Unscheduled transmission 03/01/17: 4.4 second ventricular standstill at 5am. Rhythm converted from  atypical A. Flutter to NSR. No change in therapy for now, continue to observe  Lexiscan myoview stress test: 03/03/2017:                                                                        1. The exercise tolerance was not assessed due to pharmacologic stress. Stress EKG Test Results Normal.     2. Normal SPECT study with normal left ventricular systolic function Low risk study. Normal left ventricular systolic function. No scar or ischemia detected by nuclear imaging.  Echocardiogram 03/25/2017: Left ventricle cavity is normal in size. Normal global wall motion. Grade II diastolic dysfunction. Elevated LVEDP. Calculated EF 55%. Mild biatrial dilatation.  Mild to moderate aortic, mitral, and tricuspid regurgitation. Mild pulmonic regurgitation. Elevated central venous pressure. Estimated pulmonary artery systolic pressure 48 mmHg. Compared to prior study dated 08/15/2016, dominant rhythm is now sinus. Pulmonary hypertension has progressed.  Carotid artery duplex  05/24/2019: Stenosis in the right internal carotid artery (16-49%). Peak systolic velocities in the left bifurcation, internal, external and common carotid arteries are within normal limits. Antegrade right vertebral artery flow. Alternating left vertebral artery flow suggests left subclavian artery stenosis. Follow up in one year is appropriate if clinically indicated. No significant change from 03/25/2017.  Assessment     ICD-10-CM   1. Paroxysmal atrial fibrillation. CHA2DS2-VASc Score is 5.  Yearly risk of stroke: 6.7% (A, F, HTN, Vasc Dz).  I48.0 EKG 12-Lead  2. Essential hypertension  I10   3. Asymptomatic stenosis of right carotid artery  I65.21   4. Hypercholesteremia  E78.00     EKG 05/31/2019: Normal sinus rhythm at rate of 68 bpm, normal axis, no evidence of ischemia, normal EKG.   EKG 11/27/2018: Atypical atrial flutter with variable AV conduction and controlled ventricular response at the rate of 78 bpm, normal  axis, IVCD, nonspecific T abnormality.   No orders of the defined types were placed in this encounter.   There are no discontinued medications.   Recommendations:   Kimberly Villegas  is a 85 y.o. female  with paroxysmal atrial fibrillation, s/p cardioversion on 07/29/17, 09/2016. She also has mild to moderate aortic and mitral regurgitation and very mild asymptomatic left subclavian artery stenosis, right carotid stenosis, hypertension, and hyperlipidemia.  She is presently doing well, essentially remains asymptomatic fortunately has reverted back to sinus rhythm.  She is not on any antiarrhythmic drugs, she  did not tolerate amiodarone in the past although did not maintain sinus rhythm and she also felt poorly.  She has also lost about 10 to 15 pounds in weight with diet and exercise and this may have helped.  She continue carotid artery surveillance.  I reviewed her external labs, labs are stable including hemoglobin at 11.0, mildly anemic.  (Remained stable over the last 2 to 3 years.  No changes in the medications were done today.  I will see her back in 1 year.  Blood pressure is also well controlled.  Adrian Prows, MD, [facility] 05/31/2019, 11:25 AM [facility]. [place] Office: [phone]

## 2019-06-02 ENCOUNTER — Encounter: Payer: Self-pay | Admitting: Podiatry

## 2019-06-02 ENCOUNTER — Other Ambulatory Visit: Payer: Self-pay

## 2019-06-02 ENCOUNTER — Ambulatory Visit: Payer: Medicare HMO | Admitting: Podiatry

## 2019-06-02 DIAGNOSIS — M79674 Pain in right toe(s): Secondary | ICD-10-CM

## 2019-06-02 DIAGNOSIS — M79675 Pain in left toe(s): Secondary | ICD-10-CM | POA: Diagnosis not present

## 2019-06-02 DIAGNOSIS — B351 Tinea unguium: Secondary | ICD-10-CM

## 2019-06-02 NOTE — Progress Notes (Signed)
Subjective: Kimberly Villegas presents today for follow up of painful mycotic nails b/l that are difficult to trim. Pain interferes with ambulation. Aggravating factors include wearing enclosed shoe gear. Pain is relieved with periodic professional debridement.   No Known Allergies   Objective: There were no vitals filed for this visit.  Vascular Examination:  capillary refill time to digits immediate b/l, palpable DP pulses b/l, palpable PT pulses b/l, pedal hair absent b/l and skin temperature gradient within normal limits b/l  Dermatological Examination: Pedal skin with normal turgor, texture and tone bilaterally, no open wounds bilaterally, no interdigital macerations bilaterally and toenails 1, 3, 4, 5 b/l elongated, dystrophic, thickened, crumbly with subungual debris  Musculoskeletal: normal muscle strength 5/5 to all lower extremity muscle groups bilaterally, no pain crepitus or joint limitation noted with ROM b/l and digital amputation left, right, 2nd toe  Neurological: sensation intact 5/5 intact bilaterally with 10g monofilament b/l, vibratory sensation intact b/l and proprioception intact bilaterally  Assessment: No diagnosis found.   Plan: -Toenails 1, 3, 4, 5 b/l were debrided in length and girth without iatrogenic bleeding. -Patient to continue soft, supportive shoe gear daily. -Patient to report any pedal injuries to medical professional immediately. -Patient/POA to call should there be question/concern in the interim.  Return in about 9 weeks (around 08/04/2019) for nail trim/ Eliquis.

## 2019-06-02 NOTE — Patient Instructions (Signed)

## 2019-06-08 DIAGNOSIS — Z0000 Encounter for general adult medical examination without abnormal findings: Secondary | ICD-10-CM | POA: Insufficient documentation

## 2019-06-11 ENCOUNTER — Other Ambulatory Visit: Payer: Self-pay | Admitting: Internal Medicine

## 2019-06-11 DIAGNOSIS — Z1231 Encounter for screening mammogram for malignant neoplasm of breast: Secondary | ICD-10-CM

## 2019-07-23 ENCOUNTER — Ambulatory Visit: Payer: Medicare HMO

## 2019-07-30 ENCOUNTER — Ambulatory Visit: Payer: Medicare HMO

## 2019-08-04 ENCOUNTER — Encounter: Payer: Self-pay | Admitting: Podiatry

## 2019-08-04 ENCOUNTER — Other Ambulatory Visit: Payer: Self-pay

## 2019-08-04 ENCOUNTER — Ambulatory Visit: Payer: Medicare HMO | Admitting: Podiatry

## 2019-08-04 DIAGNOSIS — M79674 Pain in right toe(s): Secondary | ICD-10-CM | POA: Diagnosis not present

## 2019-08-04 DIAGNOSIS — M79675 Pain in left toe(s): Secondary | ICD-10-CM

## 2019-08-04 DIAGNOSIS — Z89429 Acquired absence of other toe(s), unspecified side: Secondary | ICD-10-CM

## 2019-08-04 DIAGNOSIS — B351 Tinea unguium: Secondary | ICD-10-CM | POA: Diagnosis not present

## 2019-08-04 NOTE — Patient Instructions (Addendum)
Low-Purine Eating Plan A low-purine eating plan involves making food choices to limit your intake of purine. Purine is a kind of uric acid. Too much uric acid in your blood can cause certain conditions, such as gout and kidney stones. Eating a low-purine diet can help control these conditions. What are tips for following this plan? Reading food labels   Avoid foods with saturated or Trans fat.  Check the ingredient list of grains-based foods, such as bread and cereal, to make sure that they contain whole grains.  Check the ingredient list of sauces or soups to make sure they do not contain meat or fish.  When choosing soft drinks, check the ingredient list to make sure they do not contain high-fructose corn syrup. Shopping  Buy plenty of fresh fruits and vegetables.  Avoid buying canned or fresh fish.  Buy dairy products labeled as low-fat or nonfat.  Avoid buying premade or processed foods. These foods are often high in fat, salt (sodium), and added sugar. Cooking  Use olive oil instead of butter when cooking. Oils like olive oil, canola oil, and sunflower oil contain healthy fats. Meal planning  Learn which foods do or do not affect you. If you find out that a food tends to cause your gout symptoms to flare up, avoid eating that food. You can enjoy foods that do not cause problems. If you have any questions about a food item, talk with your dietitian or health care provider.  Limit foods high in fat, especially saturated fat. Fat makes it harder for your body to get rid of uric acid.  Choose foods that are lower in fat and are lean sources of protein. General guidelines  Limit alcohol intake to no more than 1 drink a day for nonpregnant women and 2 drinks a day for men. One drink equals 12 oz of beer, 5 oz of wine, or 1 oz of hard liquor. Alcohol can affect the way your body gets rid of uric acid.  Drink plenty of water to keep your urine clear or pale yellow. Fluids can help  remove uric acid from your body.  If directed by your health care provider, take a vitamin C supplement.  Work with your health care provider and dietitian to develop a plan to achieve or maintain a healthy weight. Losing weight can help reduce uric acid in your blood. What foods are recommended? The items listed may not be a complete list. Talk with your dietitian about what dietary choices are best for you. Foods low in purines Foods low in purines do not need to be limited. These include:  All fruits.  All low-purine vegetables, pickles, and olives.  Breads, pasta, rice, cornbread, and popcorn. Cake and other baked goods.  All dairy foods.  Eggs, nuts, and nut butters.  Spices and condiments, such as salt, herbs, and vinegar.  Plant oils, butter, and margarine.  Water, sugar-free soft drinks, tea, coffee, and cocoa.  Vegetable-based soups, broths, sauces, and gravies. Foods moderate in purines Foods moderate in purines should be limited to the amounts listed.   cup of asparagus, cauliflower, spinach, mushrooms, or green peas, each day.  2/3 cup uncooked oatmeal, each day.   cup dry wheat bran or wheat germ, each day.  2-3 ounces of meat or poultry, each day.  4-6 ounces of shellfish, such as crab, lobster, oysters, or shrimp, each day.  1 cup cooked beans, peas, or lentils, each day.  Soup, broths, or bouillon made from meat or   fish. Limit these foods as much as possible. What foods are not recommended? The items listed may not be a complete list. Talk with your dietitian about what dietary choices are best for you. Limit your intake of foods high in purines, including:  Beer and other alcohol.  Meat-based gravy or sauce.  Canned or fresh fish, such as: ? Anchovies, sardines, herring, and tuna. ? Mussels and scallops. ? Codfish, trout, and haddock.  Berniece Salines.  Organ meats, such as: ? Liver or kidney. ? Tripe. ? Sweetbreads (thymus gland or  pancreas).  [employer] biochemist.  Yeast or yeast extract supplements.  Drinks sweetened with high-fructose corn syrup. Summary  Eating a low-purine diet can help control conditions caused by too much uric acid in the body, such as gout or kidney stones.  Choose low-purine foods, limit alcohol, and limit foods high in fat.  You will learn over time which foods do or do not affect you. If you find out that a food tends to cause your gout symptoms to flare up, avoid eating that food. This information is not intended to replace advice given to you by your health care provider. Make sure you discuss any questions you have with your health care provider. Document Revised: 04/04/2017 Document Reviewed: 06/05/2016 Elsevier Patient Education  [place].   Gout  Gout is painful swelling of your joints. Gout is a type of arthritis. It is caused by having too much uric acid in your body. Uric acid is a chemical that is made when your body breaks down substances called purines. If your body has too much uric acid, sharp crystals can form and build up in your joints. This causes pain and swelling. Gout attacks can happen quickly and be very painful (acute gout). Over time, the attacks can affect more joints and happen more often (chronic gout). What are the causes?  Too much uric acid in your blood. This can happen because: ? Your kidneys do not remove enough uric acid from your blood. ? Your body makes too much uric acid. ? You eat too many foods that are high in purines. These foods include organ meats, some seafood, and beer.  Trauma or stress. What increases the risk?  Having a family history of gout.  Being female and middle-aged.  Being female and having gone through menopause.  Being very overweight (obese).  Drinking alcohol, especially beer.  Not having enough water in the body (being dehydrated).  Losing weight too quickly.  Having an organ transplant.  Having lead  poisoning.  Taking certain medicines.  Having kidney disease.  Having a skin condition called psoriasis. What are the signs or symptoms? An attack of acute gout usually happens in just one joint. The most common place is the big toe. Attacks often start at night. Other joints that may be affected include joints of the feet, ankle, knee, fingers, wrist, or elbow. Symptoms of an attack may include:  Very bad pain.  Warmth.  Swelling.  Stiffness.  Shiny, red, or purple skin.  Tenderness. The affected joint may be very painful to touch.  Chills and fever. Chronic gout may cause symptoms more often. More joints may be involved. You may also have white or yellow lumps (tophi) on your hands or feet or in other areas near your joints. How is this treated?  Treatment for this condition has two phases: treating an acute attack and preventing future attacks.  Acute gout treatment may include: ? NSAIDs. ? Steroids.  These are taken by mouth or injected into a joint. ? Colchicine. This medicine relieves pain and swelling. It can be given by mouth or through an IV tube.  Preventive treatment may include: ? Taking small doses of NSAIDs or colchicine daily. ? Using a medicine that reduces uric acid levels in your blood. ? Making changes to your diet. You may need to see a food expert (dietitian) about what to eat and drink to prevent gout. Follow these instructions at home: During a gout attack   If told, put ice on the painful area: ? Put ice in a plastic bag. ? Place a towel between your skin and the bag. ? Leave the ice on for 20 minutes, 2-3 times a day.  Raise (elevate) the painful joint above the level of your heart as often as you can.  Rest the joint as much as possible. If the joint is in your leg, you may be given crutches.  Follow instructions from your doctor about what you cannot eat or drink. Avoiding future gout attacks  Eat a low-purine diet. Avoid foods and drinks  such as: ? Liver. ? Kidney. ? Anchovies. ? Asparagus. ? Herring. ? Mushrooms. ? Mussels. ? Beer.  Stay at a healthy weight. If you want to lose weight, talk with your doctor. Do not lose weight too fast.  Start or continue an exercise plan as told by your doctor. Eating and drinking  Drink enough fluids to keep your pee (urine) pale yellow.  If you drink alcohol: ? Limit how much you use to:  0-1 drink a day for women.  0-2 drinks a day for men. ? Be aware of how much alcohol is in your drink. In the [place] one drink equals one 12 oz bottle of beer (355 mL), one 5 oz glass of wine (148 mL), or one 1 oz glass of hard liquor (44 mL). General instructions  Take over-the-counter and prescription medicines only as told by your doctor.  Do not drive or use heavy machinery while taking prescription pain medicine.  Return to your normal activities as told by your doctor. Ask your doctor what activities are safe for you.  Keep all follow-up visits as told by your doctor. This is important. Contact a doctor if:  You have another gout attack.  You still have symptoms of a gout attack after 10 days of treatment.  You have problems (side effects) because of your medicines.  You have chills or a fever.  You have burning pain when you pee (urinate).  You have pain in your lower back or belly. Get help right away if:  You have very bad pain.  Your pain cannot be controlled.  You cannot pee. Summary  Gout is painful swelling of the joints.  The most common site of pain is the big toe, but it can affect other joints.  Medicines and avoiding some foods can help to prevent and treat gout attacks. This information is not intended to replace advice given to you by your health care provider. Make sure you discuss any questions you have with your health care provider. Document Revised: 11/12/2017 Document Reviewed: 11/12/2017 Elsevier Patient Education  [place]? An infection that lies within the keratin of your nail plate that is caused by a fungus.  WHY ME? Fungal infections affect all ages, sexes, races, and creeds.  There may be many factors that predispose you to a fungal infection  such as age, coexisting medical conditions such as diabetes, or an autoimmune disease; stress, medications, fatigue, genetics, etc.  Bottom line: fungus thrives in a warm, moist environment and your shoes offer such a location.  IS IT CONTAGIOUS? Theoretically, yes.  You do not want to share shoes, nail clippers or files with someone who has fungal toenails.  Walking around barefoot in the same room or sleeping in the same bed is unlikely to transfer the organism.  It is important to realize, however, that fungus can spread easily from one nail to the next on the same foot.  HOW DO WE TREAT THIS?  There are several ways to treat this condition.  Treatment may depend on many factors such as age, medications, pregnancy, liver and kidney conditions, etc.  It is best to ask your doctor which options are available to you.  1. No treatment.   Unlike many other medical concerns, you can live with this condition.  However for many people this can be a painful condition and may lead to ingrown toenails or a bacterial infection.  It is recommended that you keep the nails cut short to help reduce the amount of fungal nail. 2. Topical treatment.  These range from herbal remedies to prescription strength nail lacquers.  About 40-50% effective, topicals require twice daily application for approximately 9 to 12 months or until an entirely new nail has grown out.  The most effective topicals are medical grade medications available through physicians offices. 3. Oral antifungal medications.  With an 80-90% cure rate, the most common oral medication requires 3 to 4 months of therapy and stays in your system for a year as the new nail grows out.   Oral antifungal medications do require blood work to make sure it is a safe drug for you.  A liver function panel will be performed prior to starting the medication and after the first month of treatment.  It is important to have the blood work performed to avoid any harmful side effects.  In general, this medication safe but blood work is required. 4. Laser Therapy.  This treatment is performed by applying a specialized laser to the affected nail plate.  This therapy is noninvasive, fast, and non-painful.  It is not covered by insurance and is therefore, out of pocket.  The results have been very good with a 80-95% cure rate.  [facility] is the only practice in the area to offer this therapy. 5. Permanent Nail Avulsion.  Removing the entire nail so that a new nail will not grow back.

## 2019-08-05 NOTE — Progress Notes (Signed)
Subjective: Kimberly Villegas presents today for follow up of at risk foot care. Patient has h/o amputation of L 2nd toe and R 2nd toe and painful mycotic nails b/l that are difficult to trim. Pain interferes with ambulation. Aggravating factors include wearing enclosed shoe gear. Pain is relieved with periodic professional debridement.   She relates she had an acute episode of gout and is finishing up a course of colchicine. Her ankle started feeling better about 3 days after her attack started. She thinks it may be due to drinking some white wine. She also admits she is not sure what foods to avoid to prevent recurrence of gout.   No Known Allergies   Objective: There were no vitals filed for this visit.  Pt 86 y.o. year old Caucasian female WD, WN in NAD. AAO x 3.   Vascular Examination:  Capillary refill time to digits immediate b/l. Palpable DP pulses b/l. Palpable PT pulses b/l. Pedal hair absent b/l Skin temperature gradient within normal limits b/l.  Dermatological Examination: Pedal skin with normal turgor, texture and tone bilaterally. No open wounds bilaterally. No interdigital macerations bilaterally. Toenails L hallux, L 3rd toe, L 4th toe, L 5th toe, R hallux, R 3rd toe, R 4th toe and R 5th toe elongated, dystrophic, thickened, and crumbly with subungual debris and tenderness to dorsal palpation.  Musculoskeletal: Normal muscle strength 5/5 to all lower extremity muscle groups bilaterally, no pain crepitus or joint limitation noted with ROM b/l and digital amputation L 2nd toe and R 2nd toe.  Neurological: Protective sensation intact 5/5 intact bilaterally with 10g monofilament b/l Vibratory sensation intact b/l  Assessment: 1. Pain due to onychomycosis of toenails of both feet   2. Status post amputation of lesser toe, unspecified laterality (HCC)    Plan: -Toenails L hallux, L 3rd toe, L 4th toe, L 5th toe, R hallux, R 3rd toe, R 4th toe and R 5th toe debrided in length  and girth without iatrogenic bleeding with sterile nail nipper and dremel.  -Dispensed handouts on gout and purine rich foods to avoid. -Patient to continue soft, supportive shoe gear daily. -Patient to report any pedal injuries to medical professional immediately. -Patient/POA to call should there be question/concern in the interim.  Return in about 9 weeks (around 10/06/2019) for nail trim/Eliquis.

## 2019-08-27 ENCOUNTER — Ambulatory Visit
Admission: RE | Admit: 2019-08-27 | Discharge: 2019-08-27 | Disposition: A | Discharge status: Home / Self Care | Payer: Medicare HMO | Source: Ambulatory Visit | Attending: Internal Medicine | Admitting: Internal Medicine

## 2019-08-27 ENCOUNTER — Other Ambulatory Visit: Payer: Self-pay

## 2019-08-27 DIAGNOSIS — Z1231 Encounter for screening mammogram for malignant neoplasm of breast: Secondary | ICD-10-CM

## 2019-09-04 ENCOUNTER — Other Ambulatory Visit: Payer: Self-pay | Admitting: Cardiology

## 2019-09-04 DIAGNOSIS — I10 Essential (primary) hypertension: Secondary | ICD-10-CM

## 2019-10-25 ENCOUNTER — Ambulatory Visit: Payer: Medicare HMO | Admitting: Podiatry

## 2019-10-25 ENCOUNTER — Encounter: Payer: Self-pay | Admitting: Podiatry

## 2019-10-25 ENCOUNTER — Other Ambulatory Visit: Payer: Self-pay

## 2019-10-25 DIAGNOSIS — Z89429 Acquired absence of other toe(s), unspecified side: Secondary | ICD-10-CM

## 2019-10-25 DIAGNOSIS — B351 Tinea unguium: Secondary | ICD-10-CM | POA: Diagnosis not present

## 2019-10-25 DIAGNOSIS — M79675 Pain in left toe(s): Secondary | ICD-10-CM | POA: Diagnosis not present

## 2019-10-25 DIAGNOSIS — M79674 Pain in right toe(s): Secondary | ICD-10-CM | POA: Diagnosis not present

## 2019-10-26 NOTE — Progress Notes (Signed)
Subjective: Kimberly Villegas is a pleasant 85 y.o. female patient seen today painful mycotic nails b/l that are difficult to trim. Pain interferes with ambulation. Aggravating factors include wearing enclosed shoe gear. Pain is relieved with periodic professional debridement.  Past Medical History:  Diagnosis Date  . A-fib ([place])   . Anemia, chronic disease   . Arthritis   . Asthma   . Bilateral carotid bruits   . CAD (coronary artery disease)   . Cervical stenosis of spine   . Cough   . Diastolic dysfunction   . DVT (deep venous thrombosis) ([place])    after vein stripping years ago  . GERD (gastroesophageal reflux disease)   . Hyperlipemia   . Hypertension   . Insomnia   . New onset a-fib ([place]) 2018  . Seasonal allergies   . Syncope   . Vitamin D deficiency     Patient Active Problem List   Diagnosis Date Noted  . Atrial fibrillation ([place]) 09/07/2016    Current Outpatient Medications on File Prior to Visit  Medication Sig Dispense Refill  . amLODipine (NORVASC) 5 MG tablet TAKE 1 TABLET(5 MG) BY MOUTH DAILY 90 tablet 1  . apixaban (ELIQUIS) 5 MG TABS tablet Take 5 mg by mouth 2 (two) times daily.    Marland Kitchen atorvastatin (LIPITOR) 40 MG tablet TAKE 1 TABLET DAILY 90 tablet 3  . Calcium-Vit D-Arg-Inos-Silicon (BONE DENSITY PO) Take by mouth See admin instructions. Take 1 tablet by mouth in the morning and take 2 tablets by mouth in the evening    . cephALEXin (KEFLEX) 500 MG capsule Take 500 mg by mouth 4 (four) times daily. 2000 mg prior to dental procedures    . Cholecalciferol (VITAMIN D) 2000 units tablet Take 2,000 Units by mouth daily.    . colchicine 0.6 MG tablet Take 0.6 mg by mouth 2 (two) times daily.    Marland Kitchen losartan (COZAAR) 50 MG tablet Take 1 tablet by mouth daily.    . Multiple Vitamins-Calcium (ONE-A-DAY WOMENS PO) Take 1 tablet by mouth daily.    . Multiple Vitamins-Minerals (PRESERVISION AREDS PO) Take 1 capsule by mouth 2 (two) times daily.     . TOPROL XL 50 MG 24  hr tablet Take 50 mg by mouth daily.    . Tretinoin, Facial Wrinkles, (RENOVA PUMP) 0.02 % CREA Apply 1 application topically every other day.    . zaleplon (SONATA) 5 MG capsule Take 5 mg by mouth at bedtime as needed for sleep.     No current facility-administered medications on file prior to visit.    No Known Allergies  Objective: Physical Exam  General: Kimberly Villegas is a pleasant 85 y.o. Caucasian female, WD, WN in NAD. AAO x 3.   Vascular:  Neurovascular status unchanged b/l lower extremities. Capillary refill time to digits immediate b/l. Palpable pedal pulses b/l LE. Pedal hair absent. Lower extremity skin temperature gradient within normal limits. No pain with calf compression b/l.  Dermatological:  Pedal skin with normal turgor, texture and tone bilaterally. No open wounds bilaterally. No interdigital macerations bilaterally. Toenails L hallux, L 3rd toe, L 4th toe, L 5th toe, R hallux, R 3rd toe, R 4th toe and R 5th toe elongated, discolored, dystrophic, thickened, and crumbly with subungual debris and tenderness to dorsal palpation.  Musculoskeletal:  Normal muscle strength 5/5 to all lower extremity muscle groups bilaterally. No pain crepitus or joint limitation noted with ROM b/l. No gross bony deformities bilaterally. Lower extremity amputation(s): digital  amputation L 2nd toe and R 2nd toe. Patient ambulates independent of any assistive aids.  Neurological:  Protective sensation intact 5/5 intact bilaterally with 10g monofilament b/l. Vibratory sensation intact b/l. Proprioception intact bilaterally.  Assessment and Plan:  1. Pain due to onychomycosis of toenails of both feet   2. Status post amputation of lesser toe, unspecified laterality ([place])    -Examined patient. -No new findings. No new orders. -Toenails 1-5 b/l were debrided in length and girth with sterile nail nippers and dremel without iatrogenic bleeding.  -Patient to report any pedal injuries to  medical professional immediately. -Patient to continue soft, supportive shoe gear daily. -Patient/POA to call should there be question/concern in the interim.  Return in about 9 weeks (around 12/27/2019).  Marzetta Board, DPM

## 2019-11-10 ENCOUNTER — Other Ambulatory Visit: Payer: Self-pay | Admitting: Cardiology

## 2020-02-02 ENCOUNTER — Ambulatory Visit (INDEPENDENT_AMBULATORY_CARE_PROVIDER_SITE_OTHER): Payer: Medicare HMO | Admitting: Podiatry

## 2020-02-02 ENCOUNTER — Encounter: Payer: Self-pay | Admitting: Podiatry

## 2020-02-02 ENCOUNTER — Other Ambulatory Visit: Payer: Self-pay

## 2020-02-02 DIAGNOSIS — M79675 Pain in left toe(s): Secondary | ICD-10-CM | POA: Diagnosis not present

## 2020-02-02 DIAGNOSIS — M79674 Pain in right toe(s): Secondary | ICD-10-CM | POA: Diagnosis not present

## 2020-02-02 DIAGNOSIS — B351 Tinea unguium: Secondary | ICD-10-CM | POA: Diagnosis not present

## 2020-02-02 DIAGNOSIS — Z89429 Acquired absence of other toe(s), unspecified side: Secondary | ICD-10-CM

## 2020-02-03 NOTE — Progress Notes (Signed)
Subjective: Kimberly Villegas is a pleasant 85 y.o. female patient seen today painful mycotic nails b/l that are difficult to trim. Pain interferes with ambulation. Aggravating factors include wearing enclosed shoe gear. Pain is relieved with periodic professional debridement.   She voices no new pedal concerns on today's visit.  Past Medical History:  Diagnosis Date  . A-fib ([place])   . Anemia, chronic disease   . Arthritis   . Asthma   . Bilateral carotid bruits   . CAD (coronary artery disease)   . Cervical stenosis of spine   . Cough   . Diastolic dysfunction   . DVT (deep venous thrombosis) ([place])    after vein stripping years ago  . GERD (gastroesophageal reflux disease)   . Hyperlipemia   . Hypertension   . Insomnia   . New onset a-fib ([place]) 2018  . Seasonal allergies   . Syncope   . Vitamin D deficiency     Patient Active Problem List   Diagnosis Date Noted  . Atrial fibrillation ([place]) 09/07/2016    Current Outpatient Medications on File Prior to Visit  Medication Sig Dispense Refill  . amLODipine (NORVASC) 5 MG tablet TAKE 1 TABLET(5 MG) BY MOUTH DAILY 90 tablet 1  . apixaban (ELIQUIS) 5 MG TABS tablet Take 5 mg by mouth 2 (two) times daily.    Marland Kitchen atorvastatin (LIPITOR) 40 MG tablet TAKE 1 TABLET DAILY 90 tablet 3  . Calcium-Vit D-Arg-Inos-Silicon (BONE DENSITY PO) Take by mouth See admin instructions. Take 1 tablet by mouth in the morning and take 2 tablets by mouth in the evening    . cephALEXin (KEFLEX) 500 MG capsule Take 500 mg by mouth 4 (four) times daily. 2000 mg prior to dental procedures    . Cholecalciferol (VITAMIN D) 2000 units tablet Take 2,000 Units by mouth daily.    . colchicine 0.6 MG tablet Take 0.6 mg by mouth 2 (two) times daily.    Marland Kitchen losartan (COZAAR) 50 MG tablet Take 1 tablet by mouth daily.    . Multiple Vitamins-Calcium (ONE-A-DAY WOMENS PO) Take 1 tablet by mouth daily.    . Multiple Vitamins-Minerals (PRESERVISION AREDS PO) Take 1 capsule  by mouth 2 (two) times daily.     . TOPROL XL 50 MG 24 hr tablet Take 50 mg by mouth daily.    . Tretinoin, Facial Wrinkles, (RENOVA PUMP) 0.02 % CREA Apply 1 application topically every other day.    . zaleplon (SONATA) 5 MG capsule Take 5 mg by mouth at bedtime as needed for sleep.     No current facility-administered medications on file prior to visit.    No Known Allergies  Objective: Physical Exam  General: MEGGAN Villegas is a pleasant 85 y.o. Caucasian female, WD, WN in NAD. AAO x 3.   Vascular:  Neurovascular status unchanged b/l lower extremities. Capillary refill time to digits immediate b/l. Palpable pedal pulses b/l LE. Pedal hair absent. Lower extremity skin temperature gradient within normal limits. No pain with calf compression b/l.  Dermatological:  Pedal skin with normal turgor, texture and tone bilaterally. No open wounds bilaterally. No interdigital macerations bilaterally. Toenails L hallux, L 3rd toe, L 4th toe, L 5th toe, R hallux, R 3rd toe, R 4th toe and R 5th toe elongated, discolored, dystrophic, thickened, and crumbly with subungual debris and tenderness to dorsal palpation.  Musculoskeletal:  Normal muscle strength 5/5 to all lower extremity muscle groups bilaterally. No pain crepitus or joint limitation noted with  ROM b/l. No gross bony deformities bilaterally. Lower extremity amputation(s): digital amputation L 2nd toe and R 2nd toe. Patient ambulates independent of any assistive aids.  Neurological:  Protective sensation intact 5/5 intact bilaterally with 10g monofilament b/l. Vibratory sensation intact b/l. Proprioception intact bilaterally.  Assessment and Plan:  1. Pain due to onychomycosis of toenails of both feet   2. Status post amputation of lesser toe, unspecified laterality ([place])    -Examined patient. -No new findings. No new orders. -Toenails 1-5 right, L hallux, L 3rd toe, L 4th toe and L 5th toe debrided in length and girth without  iatrogenic bleeding with sterile nail nipper and dremel.  -Patient to report any pedal injuries to medical professional immediately. -Patient to continue soft, supportive shoe gear daily. -Patient/POA to call should there be question/concern in the interim.  Return in about 9 weeks (around 04/05/2020).  Marzetta Board, DPM

## 2020-03-08 ENCOUNTER — Other Ambulatory Visit: Payer: Self-pay | Admitting: Cardiology

## 2020-03-08 DIAGNOSIS — I10 Essential (primary) hypertension: Secondary | ICD-10-CM

## 2020-04-14 ENCOUNTER — Other Ambulatory Visit: Payer: Self-pay

## 2020-04-14 ENCOUNTER — Encounter: Payer: Self-pay | Admitting: Podiatry

## 2020-04-14 ENCOUNTER — Ambulatory Visit: Payer: Medicare HMO | Admitting: Podiatry

## 2020-04-14 DIAGNOSIS — M79674 Pain in right toe(s): Secondary | ICD-10-CM

## 2020-04-14 DIAGNOSIS — Z89429 Acquired absence of other toe(s), unspecified side: Secondary | ICD-10-CM

## 2020-04-14 DIAGNOSIS — Q828 Other specified congenital malformations of skin: Secondary | ICD-10-CM | POA: Diagnosis not present

## 2020-04-14 DIAGNOSIS — B351 Tinea unguium: Secondary | ICD-10-CM | POA: Diagnosis not present

## 2020-04-14 DIAGNOSIS — M79675 Pain in left toe(s): Secondary | ICD-10-CM

## 2020-04-20 NOTE — Progress Notes (Signed)
Subjective: Kimberly Villegas is a pleasant 85 y.o. female patient seen today at risk foot care. Patient has h/o amputation of digital amputation L 2nd toe and R 2nd toe and painful thick toenails that are difficult to trim. Pain interferes with ambulation. Aggravating factors include wearing enclosed shoe gear. Pain is relieved with periodic professional debridement.   She voices no new pedal concerns on today's visit.  Past Medical History:  Diagnosis Date  . A-fib ([place])   . Anemia, chronic disease   . Arthritis   . Asthma   . Bilateral carotid bruits   . CAD (coronary artery disease)   . Cervical stenosis of spine   . Cough   . Diastolic dysfunction   . DVT (deep venous thrombosis) ([place])    after vein stripping years ago  . GERD (gastroesophageal reflux disease)   . Hyperlipemia   . Hypertension   . Insomnia   . New onset a-fib ([place]) 2018  . Seasonal allergies   . Syncope   . Vitamin D deficiency     Patient Active Problem List   Diagnosis Date Noted  . Atrial fibrillation ([place]) 09/07/2016    Current Outpatient Medications on File Prior to Visit  Medication Sig Dispense Refill  . amLODipine (NORVASC) 5 MG tablet TAKE 1 TABLET(5 MG) BY MOUTH DAILY 90 tablet 1  . apixaban (ELIQUIS) 5 MG TABS tablet Take 5 mg by mouth 2 (two) times daily.    Marland Kitchen atorvastatin (LIPITOR) 40 MG tablet TAKE 1 TABLET DAILY 90 tablet 3  . Calcium-Vit D-Arg-Inos-Silicon (BONE DENSITY PO) Take by mouth See admin instructions. Take 1 tablet by mouth in the morning and take 2 tablets by mouth in the evening    . cephALEXin (KEFLEX) 500 MG capsule Take 500 mg by mouth 4 (four) times daily. 2000 mg prior to dental procedures    . Cholecalciferol (VITAMIN D) 2000 units tablet Take 2,000 Units by mouth daily.    . colchicine 0.6 MG tablet Take 0.6 mg by mouth 2 (two) times daily.    Marland Kitchen losartan (COZAAR) 50 MG tablet Take 1 tablet by mouth daily.    . Multiple Vitamins-Calcium (ONE-A-DAY WOMENS PO) Take 1  tablet by mouth daily.    . Multiple Vitamins-Minerals (PRESERVISION AREDS PO) Take 1 capsule by mouth 2 (two) times daily.     . TOPROL XL 50 MG 24 hr tablet Take 50 mg by mouth daily.    . Tretinoin, Facial Wrinkles, (RENOVA PUMP) 0.02 % CREA Apply 1 application topically every other day.    . zaleplon (SONATA) 5 MG capsule Take 5 mg by mouth at bedtime as needed for sleep.     No current facility-administered medications on file prior to visit.    No Known Allergies  Objective: Physical Exam  General: Kimberly Villegas is a pleasant 85 y.o. Caucasian female, WD, WN in NAD. AAO x 3.   Vascular:  Neurovascular status unchanged b/l lower extremities. Capillary refill time to digits immediate b/l. Palpable pedal pulses b/l LE. Pedal hair absent. Lower extremity skin temperature gradient within normal limits. No pain with calf compression b/l.  Dermatological:  Pedal skin with normal turgor, texture and tone bilaterally. No open wounds bilaterally. No interdigital macerations bilaterally. Toenails L hallux, L 3rd toe, L 4th toe, L 5th toe, R hallux, R 3rd toe, R 4th toe and R 5th toe elongated, discolored, dystrophic, thickened, and crumbly with subungual debris and tenderness to dorsal palpation. Porokeratotic lesion(s) submet head  2 left foot and submet head 3 left foot. No erythema, no edema, no drainage, no fluctuance.  Musculoskeletal:  Normal muscle strength 5/5 to all lower extremity muscle groups bilaterally. No pain crepitus or joint limitation noted with ROM b/l. No gross bony deformities bilaterally. Lower extremity amputation(s): digital amputation L 2nd toe and R 2nd toe. Patient ambulates independent of any assistive aids.  Neurological:  Protective sensation intact 5/5 intact bilaterally with 10g monofilament b/l. Vibratory sensation intact b/l. Proprioception intact bilaterally.  Assessment and Plan:  1. Pain due to onychomycosis of toenails of both feet   2. Porokeratosis    3. Status post amputation of lesser toe, unspecified laterality ([place])    -Examined patient. -No new findings. No new orders. -Toenails L hallux, L 3rd toe, L 4th toe, L 5th toe, R hallux, R 3rd toe, R 4th toe and R 5th toe debrided in length and girth without iatrogenic bleeding with sterile nail nipper and dremel.  -Painful porokeratotic lesion(s) submet head 2 left foot and submet head 3 left foot pared and enucleated with sterile scalpel blade without incident. -Patient to report any pedal injuries to medical professional immediately. -Patient/POA to call should there be question/concern in the interim.  Return in about 9 weeks (around 06/16/2020).  Marzetta Board, DPM

## 2020-05-09 ENCOUNTER — Other Ambulatory Visit: Payer: Self-pay

## 2020-05-09 ENCOUNTER — Ambulatory Visit: Payer: Medicare HMO

## 2020-05-09 ENCOUNTER — Telehealth: Payer: Self-pay | Admitting: Cardiology

## 2020-05-09 ENCOUNTER — Other Ambulatory Visit: Payer: Medicare HMO

## 2020-05-22 ENCOUNTER — Ambulatory Visit: Payer: Medicare HMO | Admitting: Cardiology

## 2020-05-26 ENCOUNTER — Ambulatory Visit: Payer: Medicare HMO | Admitting: Cardiology

## 2020-05-26 ENCOUNTER — Encounter: Payer: Self-pay | Admitting: Cardiology

## 2020-05-26 ENCOUNTER — Other Ambulatory Visit: Payer: Self-pay

## 2020-05-26 VITALS — BP 128/63 | HR 86 | Temp 97.8°F | Resp 16 | Ht 65.0 in | Wt 147.0 lb

## 2020-05-26 DIAGNOSIS — E7800 Pure hypercholesterolemia, unspecified: Secondary | ICD-10-CM

## 2020-05-26 DIAGNOSIS — I10 Essential (primary) hypertension: Secondary | ICD-10-CM

## 2020-05-26 DIAGNOSIS — R1900 Intra-abdominal and pelvic swelling, mass and lump, unspecified site: Secondary | ICD-10-CM

## 2020-05-26 DIAGNOSIS — I6523 Occlusion and stenosis of bilateral carotid arteries: Secondary | ICD-10-CM

## 2020-05-26 DIAGNOSIS — I4819 Other persistent atrial fibrillation: Secondary | ICD-10-CM

## 2020-05-26 MED ORDER — MULTAQ 400 MG PO TABS: 400.0000 mg | ORAL_TABLET | Freq: Two times a day (BID) | ORAL | 3 refills | Status: DC

## 2020-05-26 NOTE — Progress Notes (Signed)
Primary Physician/Referring:  Prince Solian, MD  Patient ID: Kimberly Villegas, female    DOB: 07-May-1934, 86 y.o.   MRN: [id number]  Chief Complaint  Patient presents with  . Atrial Fibrillation  . Carotid  . Follow-up    1 year   HPI:    Kimberly Villegas  is a 86 y.o.  female  with paroxysmal atrial fibrillation, s/p cardioversion on 09/2016, 07/29/17, mild to moderate aortic and mitral regurgitation and very mild asymptomatic left subclavian artery stenosis, carotid atherosclerosis, hypertension, and hyperlipidemia.  She also had nonischemic cardiomyopathy with EF 40% when she was in atrial fibrillation which improved back to normal after she maintained sinus rhythm in 2018.  She is presently doing well, essentially remains asymptomatic, however patient has noticed irregular heartbeat for the past 2 months.  This is her annual visit, states that she is doing well and has not had any further palpitations, but has noticed her apple watch stating that she is in A. fib.  Denies chest pain or shortness of breath.  No bleeding diathesis on Eliquis.  Past Medical History:  Diagnosis Date  . A-fib ([place])   . Anemia, chronic disease   . Arthritis   . Asthma   . Bilateral carotid bruits   . CAD (coronary artery disease)   . Cervical stenosis of spine   . Cough   . Diastolic dysfunction   . DVT (deep venous thrombosis) ([place])    after vein stripping years ago  . GERD (gastroesophageal reflux disease)   . Hyperlipemia   . Hypertension   . Insomnia   . New onset a-fib ([place]) 2018  . Seasonal allergies   . Syncope   . Vitamin D deficiency    Past Surgical History:  Procedure Laterality Date  . AMPUTATION TOE Bilateral 07/25/2016   Procedure: bilateral 2nd toe amputations;  Surgeon: Wylene Simmer, MD;  Location: [place];  Service: Orthopedics;  Laterality: Bilateral;  . BASAL CELL CARCINOMA EXCISION    . CARDIOVERSION N/A 09/10/2016   Procedure: CARDIOVERSION;   Surgeon: Adrian Prows, MD;  Location: [place];  Service: Cardiovascular;  Laterality: N/A;  . CARDIOVERSION N/A 07/29/2017   Procedure: CARDIOVERSION;  Surgeon: Nigel Mormon, MD;  Location: [place];  Service: Cardiovascular;  Laterality: N/A;  . FOOT SURGERY Left   . JOINT REPLACEMENT Right   . VEIN LIGATION AND STRIPPING Right    Social History   Tobacco Use  . Smoking status: Former Smoker    Packs/day: 0.25    Years: 5.00    Pack years: 1.25    Types: Cigarettes    Quit date: 1970    Years since quitting: 52.0  . Smokeless tobacco: Never Used  . Tobacco comment: social  Substance Use Topics  . Alcohol use: Yes    Alcohol/week: 1.0 standard drink    Types: 1 Glasses of wine per week    Comment: occa  Marital Status: Married   ROS  Review of Systems  Cardiovascular: Negative for chest pain, dyspnea on exertion and leg swelling.  Gastrointestinal: Negative for melena.  Psychiatric/Behavioral: The patient has insomnia.    Objective  Blood pressure 128/63, pulse 86, temperature 97.8 F (36.6 C), resp. rate 16, height 5\' 5"  (1.651 m), weight 147 lb (66.7 kg), SpO2 98 %.  Vitals with BMI 05/26/2020 05/31/2019 11/27/2018  Height 5\' 5"  5\' 5"  5\' 5"   Weight 147 lbs 133 lbs 10 oz 143 lbs 13 oz  BMI 24.46 22.23  0000000  Systolic 0000000 99 [id number]  Diastolic 63 37 75  Pulse 86 73 92     Physical Exam Neck:     Thyroid: No thyromegaly.  Cardiovascular:     Rate and Rhythm: Normal rate. Rhythm irregular.     Pulses: Normal pulses and intact distal pulses.          Carotid pulses are on the right side with bruit and on the left side with bruit.    Heart sounds: Murmur heard.   Blowing midsystolic murmur is present with a grade of 2/6 at the lower left sternal border. No gallop.      Comments: No leg edema, no JVD. Pulmonary:     Effort: Pulmonary effort is normal.     Breath sounds: Normal breath sounds.  Abdominal:     General: Bowel sounds are normal.     Palpations:  Abdomen is soft.  Musculoskeletal:     Cervical back: Neck supple.  Skin:    General: Skin is warm and dry.    Laboratory examination:   No results for input(s): NA, K, CL, CO2, GLUCOSE, BUN, CREATININE, CALCIUM, GFRNONAA, GFRAA in the last 8760 hours. CrCl cannot be calculated (Patient's most recent lab result is older than the maximum 21 days allowed.).  CMP Latest Ref Rng & Units 07/27/2016  Glucose 65 - 99 mg/dL 87  BUN 6 - 20 mg/dL 18  Creatinine 0.44 - 1.00 mg/dL 0.95  Sodium 135 - 145 mmol/L 139  Potassium 3.5 - 5.1 mmol/L 3.9  Chloride 101 - 111 mmol/L 103  CO2 22 - 32 mmol/L 28  Calcium 8.9 - 10.3 mg/dL 9.6   CBC Latest Ref Rng & Units 03/17/2019 07/27/2016  WBC 3.8 - 10.8 Thousand/uL 9.8 6.9  Hemoglobin 11.7 - 15.5 g/dL 11.0(L) 11.9(L)  Hematocrit 35.0 - 45.0 % 33.3(L) 36.2  Platelets 140 - 400 Thousand/uL 175 182   Lipid Panel  No results found for: CHOL, TRIG, HDL, CHOLHDL, VLDL, LDLCALC, LDLDIRECT HEMOGLOBIN A1C No results found for: HGBA1C, MPG TSH No results for input(s): TSH in the last 8760 hours.  External labs:    Cholesterol, total 158.000 m 06/08/2019 HDL 76 MG/DL 06/08/2019 LDL 73.000 mg 06/08/2019 Triglycerides 45.000 06/08/2019  A1C 5.100 % 06/09/2019 TSH 1.960 06/08/2019  Hemoglobin 11.400 g/d 06/08/2019  Creatinine, Serum 1.000 mg/ 06/08/2019 ALT (SGPT) 14.000 uni 06/08/2019   Medications and allergies  No Known Allergies   Current Outpatient Medications on File Prior to Visit  Medication Sig Dispense Refill  . amLODipine (NORVASC) 5 MG tablet TAKE 1 TABLET(5 MG) BY MOUTH DAILY 90 tablet 1  . apixaban (ELIQUIS) 5 MG TABS tablet Take 5 mg by mouth 2 (two) times daily.    Marland Kitchen atorvastatin (LIPITOR) 40 MG tablet TAKE 1 TABLET DAILY 90 tablet 3  . Calcium-Vit D-Arg-Inos-Silicon (BONE DENSITY PO) Take by mouth See admin instructions. Take 1 tablet by mouth in the morning and take 2 tablets by mouth in the evening    . cephALEXin (KEFLEX) 500 MG capsule Take  500 mg by mouth 4 (four) times daily. 2000 mg prior to dental procedures    . Cholecalciferol (VITAMIN D) 2000 units tablet Take 2,000 Units by mouth daily.    Marland Kitchen losartan (COZAAR) 50 MG tablet Take 1 tablet by mouth daily.    . Multiple Vitamins-Calcium (ONE-A-DAY WOMENS PO) Take 1 tablet by mouth daily.    . Multiple Vitamins-Minerals (PRESERVISION AREDS PO) Take 1 capsule by mouth 2 (two) times daily.     Marland Kitchen  TOPROL XL 50 MG 24 hr tablet Take 50 mg by mouth daily.    . Tretinoin, Facial Wrinkles, 0.02 % CREA Apply 1 application topically every other day.    . zaleplon (SONATA) 5 MG capsule Take 5 mg by mouth at bedtime as needed for sleep.     No current facility-administered medications on file prior to visit.    Radiology:  No results found.  Cardiac Studies:   Sleep Study 2006: Normal.  Lower extremity arterial duplex 05/24/2015:  No hemodynamically significant stenoses are identified in bilateral lower extremity arterial system. Mild plaque noted.  This exam reveals normal perfusion of both  the  lower extremities with RABI >1.14 and LABI 1.07.  Abdominal Ultrasound 02/11/2018: The maximum aorta diameter is 2.47 cm (prox) with mild ectasia. Calcific plaque noted in the abdominal aorta.  Normal flow velocities noted in the bilateral iliac arteries. Compared to 05/24/2015, Maximum aortid dilatation was 2.92. F/U studies if clinically indicated.  Successful cardioversion 07/29/2017, 09/10/2016.   Subclavian artery duplex 07/18/2016: Left subclavian artery is abnormal, monophasic, suggestive of proximal obstruction. Left vertebral artery waveform shows high resistance. Antegrade right vertebral artery flow.  Blood Pressure Right: 155/80   Left: 135/75 No hemodynamically significant arterial disease in the common carotid artery bilaterally. Compared to the study done on 05/30/2015, left subclavian artery monophasic waveform now well demonstrated.  Otherwise no significant  change.  Abdominal aortic duplex 02/11/2018: The maximum aorta diameter is 2.47 cm (prox) with mild ectasia. Calcific plaque noted in the abdominal aorta.  Normal flow velocities noted in the bilateral iliac arteries. Compared to 05/24/2015, Maximum aortid dilatation was 2.92. F/U studies if clinically indicated.  Event Monitor: Episodic transmission 02/20/2017 reveals 4 beat run of NSVT at 12:20 a.m. Unscheduled transmission 03/01/17: 4.4 second ventricular standstill at 5am. Rhythm converted from atypical A. Flutter to NSR. No change in therapy for now, continue to observe  Lexiscan myoview stress test: 03/03/2017:                                                                        1. The exercise tolerance was not assessed due to pharmacologic stress. Stress EKG Test Results Normal.     2. Normal SPECT study with normal left ventricular systolic function Low risk study. Normal left ventricular systolic function. No scar or ischemia detected by nuclear imaging.  Echocardiogram 03/25/2017: Left ventricle cavity is normal in size. Normal global wall motion. Grade II diastolic dysfunction. Elevated LVEDP. Calculated EF 55%. Mild biatrial dilatation.  Mild to moderate aortic, mitral, and tricuspid regurgitation. Mild pulmonic regurgitation. Elevated central venous pressure. Estimated pulmonary artery systolic pressure 48 mmHg. Compared to prior study dated 08/15/2016, dominant rhythm is now sinus. Pulmonary hypertension has progressed.  Carotid artery duplex 05/09/2020:  Minimal stenosis in the right internal carotid artery (1-15%) with heterogeneous plaque. Stenosis in the right external carotid artery (<50%).  Minimal stenosis in the left internal carotid artery (1-15%) with heterogeneous plaque. Stenosis in the left external carotid artery (<50%).  Antegrade right vertebral artery flow. Antegrade left vertebral artery flow.  Follow up studies is appropriate when clinically indicated.  Compared to 05/24/2019, right ICA stenosis of 15-49% not present.    EKG:   EKG 05/26/2020:  Atrial fibrillation with controlled ventricular response at rate of 82 bpm, normal axis, poor R wave progression, probably normal variant.  Nonspecific ST-T abnormality.  Single PVC.  Low voltage -possible pulmonary disease.  Compared to 05/31/2019, normal sinus rhythm and normal EKG.   Assessment     ICD-10-CM   1. Persistent atrial fibrillation (HCC)  I48.19 PCV ECHOCARDIOGRAM COMPLETE    dronedarone (MULTAQ) 400 MG tablet  2. Essential hypertension  I10 EKG 12-Lead  3. Atherosclerosis of both carotid arteries  I65.23   4. Hypercholesteremia  E78.00   5. Pulsatile abdominal mass  R19.00 PCV AORTA DUPLEX    CHA2DS2-VASc Score is 5.  Yearly risk of stroke: 6.7% (A, F, HTN, Vasc Dz).  Score of 1=0.6; 2=2.2; 3=3.2; 4=4.8; 5=7.2; 6=9.8; 7=>9.8) -(CHF; HTN; vasc disease DM,  Female = 1; Age <65 =0; 65-74 = 1,  >75 =2; stroke/embolism= 2).    Meds ordered this encounter  Medications  . dronedarone (MULTAQ) 400 MG tablet    Sig: Take 1 tablet (400 mg total) by mouth 2 (two) times daily with a meal.    Dispense:  60 tablet    Refill:  3   Medications Discontinued During This Encounter  Medication Reason  . colchicine 0.6 MG tablet No longer needed (for PRN medications)    Orders Placed This Encounter  Procedures  . EKG 12-Lead  . PCV ECHOCARDIOGRAM COMPLETE    Standing Status:   Future    Standing Expiration Date:   05/26/2021     Recommendations:   Kimberly Villegas  is a 86 y.o. female  with paroxysmal atrial fibrillation, s/p cardioversion on 09/2016, 07/29/17, mild to moderate aortic and mitral regurgitation and very mild asymptomatic left subclavian artery stenosis, carotid atherosclerosis, hypertension, and hyperlipidemia.  She also had nonischemic cardiomyopathy with EF 40% when she was in atrial fibrillation which improved back to normal after she maintained sinus rhythm in  2018.  She is presently doing well, essentially remains asymptomatic, however patient has noticed irregular heartbeat for the past 2 months and now has persistent atrial fibrillation.  In view of prior history of cardiomyopathy with a EF, I would certainly like to maintain sinus rhythm.  I will start her on Multaq 400 mg p.o. twice daily.  She did not tolerate amiodarone in the past and was not successful in maintaining sinus rhythm.  I will obtain an echocardiogram to reevaluate LV systolic function.  If EF is normal, could consider other antiarrhythmic therapy if cost is an issue.  She also has prominent abdominal aortic pulsation, will also obtain AAA screening.  With regard to carotid artery stenosis, she has mild plaque in the internal carotid artery but significant disease in bilateral external carotid artery, will probably rescreen her in 2 to 3 years.  Blood pressure is well controlled, lipids being managed by PCP and presently on high intensity statin, continue the same.  I would like to see her back in 4 weeks for follow-up.  I spent 40 minutes with the patient and her husband with discussions regarding manifestations of atrial fibrillation, rhythm control versus rate control, risk of antiarrhythmic therapy.   Adrian Prows, MD, [facility] 05/26/2020, 11:35 AM Office: [phone] Pager: [phone]

## 2020-05-28 NOTE — Telephone Encounter (Signed)
Uploaded Carotid artery duplex

## 2020-05-29 ENCOUNTER — Ambulatory Visit: Payer: Medicare HMO | Admitting: Cardiology

## 2020-06-16 ENCOUNTER — Other Ambulatory Visit: Payer: Self-pay

## 2020-06-16 ENCOUNTER — Ambulatory Visit: Payer: Medicare HMO

## 2020-06-16 ENCOUNTER — Encounter: Payer: Self-pay | Admitting: Podiatry

## 2020-06-16 ENCOUNTER — Ambulatory Visit: Payer: Medicare HMO | Admitting: Podiatry

## 2020-06-16 DIAGNOSIS — M79674 Pain in right toe(s): Secondary | ICD-10-CM | POA: Diagnosis not present

## 2020-06-16 DIAGNOSIS — M79675 Pain in left toe(s): Secondary | ICD-10-CM

## 2020-06-16 DIAGNOSIS — Q828 Other specified congenital malformations of skin: Secondary | ICD-10-CM

## 2020-06-16 DIAGNOSIS — B351 Tinea unguium: Secondary | ICD-10-CM | POA: Diagnosis not present

## 2020-06-16 DIAGNOSIS — I4819 Other persistent atrial fibrillation: Secondary | ICD-10-CM

## 2020-06-16 DIAGNOSIS — Z89429 Acquired absence of other toe(s), unspecified side: Secondary | ICD-10-CM | POA: Diagnosis not present

## 2020-06-16 DIAGNOSIS — R1900 Intra-abdominal and pelvic swelling, mass and lump, unspecified site: Secondary | ICD-10-CM

## 2020-06-22 NOTE — Progress Notes (Signed)
Subjective: Kimberly Villegas is a pleasant 86 y.o. female patient seen today at risk foot care. Patient has h/o amputation of digital amputation L 2nd toe and R 2nd toe and painful thick toenails that are difficult to trim. Pain interferes with ambulation. Aggravating factors include wearing enclosed shoe gear. Pain is relieved with periodic professional debridement.   She voices no new pedal concerns on today's visit.  PCP is Dr. Alfonso Patten. Dagmar Hait.   No Known Allergies  Objective: Physical Exam  General: Kimberly Villegas is a pleasant 86 y.o. Caucasian female, WD, WN in NAD. AAO x 3.   Vascular:  Neurovascular status unchanged b/l lower extremities. Capillary refill time to digits immediate b/l. Palpable pedal pulses b/l LE. Pedal hair absent. Lower extremity skin temperature gradient within normal limits. No pain with calf compression b/l.  Dermatological:  Pedal skin with normal turgor, texture and tone bilaterally. No open wounds bilaterally. No interdigital macerations bilaterally. Toenails L hallux, L 3rd toe, L 4th toe, L 5th toe, R hallux, R 3rd toe, R 4th toe and R 5th toe elongated, discolored, dystrophic, thickened, and crumbly with subungual debris and tenderness to dorsal palpation. Porokeratotic lesion(s) submet head 2 left foot and submet head 3 left foot. No erythema, no edema, no drainage, no fluctuance.  Musculoskeletal:  Normal muscle strength 5/5 to all lower extremity muscle groups bilaterally. No pain crepitus or joint limitation noted with ROM b/l. No gross bony deformities bilaterally. Lower extremity amputation(s): digital amputation L 2nd toe and R 2nd toe. Patient ambulates independent of any assistive aids.  Neurological:  Protective sensation intact 5/5 intact bilaterally with 10g monofilament b/l. Vibratory sensation intact b/l. Proprioception intact bilaterally.  Assessment and Plan:  1. Pain due to onychomycosis of toenails of both feet   2. Porokeratosis   3.  Status post amputation of lesser toe, unspecified laterality ([place])    -Examined patient. -No new findings. No new orders. -Toenails L hallux, L 3rd toe, L 4th toe, L 5th toe, R hallux, R 3rd toe, R 4th toe and R 5th toe debrided in length and girth without iatrogenic bleeding with sterile nail nipper and dremel.  -Painful porokeratotic lesion(s) submet head 2 left foot and submet head 3 left foot pared and enucleated with sterile scalpel blade without incident. -Patient to report any pedal injuries to medical professional immediately. -Patient/POA to call should there be question/concern in the interim.  Return in about 9 weeks (around 08/18/2020) for nail and callus trim.  Marzetta Board, DPM

## 2020-06-28 ENCOUNTER — Other Ambulatory Visit: Payer: Self-pay | Admitting: Cardiology

## 2020-06-28 DIAGNOSIS — I4819 Other persistent atrial fibrillation: Secondary | ICD-10-CM

## 2020-06-28 MED ORDER — MULTAQ 400 MG PO TABS: 400.0000 mg | ORAL_TABLET | Freq: Two times a day (BID) | ORAL | 3 refills | Status: DC

## 2020-06-30 ENCOUNTER — Encounter: Payer: Self-pay | Admitting: Cardiology

## 2020-06-30 ENCOUNTER — Ambulatory Visit: Payer: Medicare HMO | Admitting: Cardiology

## 2020-06-30 ENCOUNTER — Other Ambulatory Visit: Payer: Self-pay

## 2020-06-30 VITALS — BP 113/41 | HR 76 | Temp 97.9°F | Resp 16 | Ht 65.0 in | Wt 143.0 lb

## 2020-06-30 DIAGNOSIS — I428 Other cardiomyopathies: Secondary | ICD-10-CM

## 2020-06-30 DIAGNOSIS — I4819 Other persistent atrial fibrillation: Secondary | ICD-10-CM

## 2020-06-30 DIAGNOSIS — I10 Essential (primary) hypertension: Secondary | ICD-10-CM

## 2020-06-30 NOTE — H&P (View-Only) (Signed)
Primary Physician/Referring:  Prince Solian, MD  Patient ID: Kimberly Villegas, female    DOB: [birth year], 86 y.o.   MRN: [id number]  Chief Complaint  Patient presents with  . Atrial Fibrillation  . Follow-up    4 weeks  . Hypertension   HPI:    Kimberly Villegas  is a 86 y.o.  female  with paroxysmal atrial fibrillation, s/p cardioversion on 09/2016, 07/29/17, mild to moderate aortic and mitral regurgitation and very mild asymptomatic left subclavian artery stenosis, carotid atherosclerosis, hypertension, and hyperlipidemia.  She also had nonischemic cardiomyopathy with EF 40% when she was in atrial fibrillation which improved back to normal after she maintained sinus rhythm in 2018.  I had seen her a month ago and she was back in A. fib.  I had placed her on Multaq which he is tolerating.  She has noticed mild decreased exercise tolerance since being in A. fib.  Denies chest pain or shortness of breath.  No bleeding diathesis on Eliquis.  Past Medical History:  Diagnosis Date  . A-fib ([place])   . Anemia, chronic disease   . Arthritis   . Asthma   . Bilateral carotid bruits   . CAD (coronary artery disease)   . Cervical stenosis of spine   . Cough   . Diastolic dysfunction   . DVT (deep venous thrombosis) ([place])    after vein stripping years ago  . GERD (gastroesophageal reflux disease)   . Hyperlipemia   . Hypertension   . Insomnia   . New onset a-fib ([place]) 2018  . Seasonal allergies   . Syncope   . Vitamin D deficiency    Past Surgical History:  Procedure Laterality Date  . AMPUTATION TOE Bilateral 07/25/2016   Procedure: bilateral 2nd toe amputations;  Surgeon: Wylene Simmer, MD;  Location: [facility];  Service: Orthopedics;  Laterality: Bilateral;  . BASAL CELL CARCINOMA EXCISION    . CARDIOVERSION N/A 09/10/2016   Procedure: CARDIOVERSION;  Surgeon: Adrian Prows, MD;  Location: [facility];  Service: Cardiovascular;  Laterality: N/A;  . CARDIOVERSION  N/A 07/29/2017   Procedure: CARDIOVERSION;  Surgeon: Nigel Mormon, MD;  Location: [facility];  Service: Cardiovascular;  Laterality: N/A;  . FOOT SURGERY Left   . JOINT REPLACEMENT Right   . VEIN LIGATION AND STRIPPING Right    Social History   Tobacco Use  . Smoking status: Former Smoker    Packs/day: 0.25    Years: 5.00    Pack years: 1.25    Types: Cigarettes    Quit date: 1970    Years since quitting: 52.1  . Smokeless tobacco: Never Used  . Tobacco comment: social  Substance Use Topics  . Alcohol use: Yes    Alcohol/week: 1.0 standard drink    Types: 1 Glasses of wine per week    Comment: occa  Marital Status: Married   ROS  Review of Systems  Cardiovascular: Negative for chest pain, dyspnea on exertion and leg swelling.  Gastrointestinal: Negative for melena.  Psychiatric/Behavioral: The patient has insomnia.    Objective  Blood pressure (!) 113/41, pulse 76, temperature 97.9 F (36.6 C), resp. rate 16, height 5\' 5"  (1.651 m), weight 143 lb (64.9 kg), SpO2 100 %.  Vitals with BMI 06/30/2020 05/26/2020 05/31/2019  Height 5\' 5"  5\' 5"  5\' 5"   Weight 143 lbs 147 lbs 133 lbs 10 oz  BMI 23.8 09.32 35.57  Systolic 322 025 99  Diastolic 41 63 37  Pulse 76  86 73     Physical Exam Neck:     Thyroid: No thyromegaly.  Cardiovascular:     Rate and Rhythm: Normal rate. Rhythm irregular.     Pulses: Normal pulses and intact distal pulses.          Carotid pulses are on the right side with bruit and on the left side with bruit.    Heart sounds: Murmur heard.   Blowing midsystolic murmur is present with a grade of 2/6 at the lower left sternal border. No gallop.      Comments: No leg edema, no JVD. Pulmonary:     Effort: Pulmonary effort is normal.     Breath sounds: Normal breath sounds.  Abdominal:     General: Bowel sounds are normal.     Palpations: Abdomen is soft.  Musculoskeletal:     Cervical back: Neck supple.  Skin:    General: Skin is warm and dry.     Laboratory examination:   No results for input(s): NA, K, CL, CO2, GLUCOSE, BUN, CREATININE, CALCIUM, GFRNONAA, GFRAA in the last 8760 hours. CrCl cannot be calculated (Patient's most recent lab result is older than the maximum 21 days allowed.).  CMP Latest Ref Rng & Units 07/27/2016  Glucose 65 - 99 mg/dL 87  BUN 6 - 20 mg/dL 18  Creatinine 0.44 - 1.00 mg/dL 0.95  Sodium 135 - 145 mmol/L 139  Potassium 3.5 - 5.1 mmol/L 3.9  Chloride 101 - 111 mmol/L 103  CO2 22 - 32 mmol/L 28  Calcium 8.9 - 10.3 mg/dL 9.6   CBC Latest Ref Rng & Units 03/17/2019 07/27/2016  WBC 3.8 - 10.8 Thousand/uL 9.8 6.9  Hemoglobin 11.7 - 15.5 g/dL 11.0(L) 11.9(L)  Hematocrit 35.0 - 45.0 % 33.3(L) 36.2  Platelets 140 - 400 Thousand/uL 175 182   Lipid Panel  No results found for: CHOL, TRIG, HDL, CHOLHDL, VLDL, LDLCALC, LDLDIRECT HEMOGLOBIN A1C No results found for: HGBA1C, MPG TSH No results for input(s): TSH in the last 8760 hours.  External labs:    Cholesterol, total 158.000 m 06/08/2019 HDL 76 MG/DL 06/08/2019 LDL 73.000 mg 06/08/2019 Triglycerides 45.000 06/08/2019  A1C 5.100 % 06/09/2019 TSH 1.960 06/08/2019  Hemoglobin 11.400 g/d 06/08/2019  Creatinine, Serum 1.000 mg/ 06/08/2019 ALT (SGPT) 14.000 uni 06/08/2019   Medications and allergies  No Known Allergies   Current Outpatient Medications on File Prior to Visit  Medication Sig Dispense Refill  . amLODipine (NORVASC) 5 MG tablet TAKE 1 TABLET(5 MG) BY MOUTH DAILY 90 tablet 1  . apixaban (ELIQUIS) 5 MG TABS tablet Take 5 mg by mouth 2 (two) times daily.    Marland Kitchen atorvastatin (LIPITOR) 40 MG tablet TAKE 1 TABLET DAILY 90 tablet 3  . Calcium-Vit D-Arg-Inos-Silicon (BONE DENSITY PO) Take by mouth See admin instructions. Take 1 tablet by mouth in the morning and take 2 tablets by mouth in the evening    . cephALEXin (KEFLEX) 500 MG capsule Take 500 mg by mouth 4 (four) times daily. 2000 mg prior to dental procedures    . Cholecalciferol (VITAMIN D) 2000  units tablet Take 2,000 Units by mouth daily.    Marland Kitchen dronedarone (MULTAQ) 400 MG tablet Take 1 tablet (400 mg total) by mouth 2 (two) times daily with a meal. 180 tablet 3  . losartan (COZAAR) 50 MG tablet Take 1 tablet by mouth daily.    . Multiple Vitamins-Calcium (ONE-A-DAY WOMENS PO) Take 1 tablet by mouth daily.    . Multiple Vitamins-Minerals (PRESERVISION  AREDS PO) Take 1 capsule by mouth 2 (two) times daily.     . TOPROL XL 50 MG 24 hr tablet Take 50 mg by mouth daily.    . Tretinoin, Facial Wrinkles, 0.02 % CREA Apply 1 application topically every other day.    . zaleplon (SONATA) 5 MG capsule Take 5 mg by mouth at bedtime as needed for sleep.     No current facility-administered medications on file prior to visit.    Radiology:  No results found.  Cardiac Studies:   Sleep Study 2006: Normal.  Lower extremity arterial duplex 05/24/2015:  No hemodynamically significant stenoses are identified in bilateral lower extremity arterial system. Mild plaque noted.  This exam reveals normal perfusion of both  the  lower extremities with RABI >1.14 and LABI 1.07.  Abdominal Ultrasound 02/11/2018: The maximum aorta diameter is 2.47 cm (prox) with mild ectasia. Calcific plaque noted in the abdominal aorta.  Normal flow velocities noted in the bilateral iliac arteries. Compared to 05/24/2015, Maximum aortid dilatation was 2.92. F/U studies if clinically indicated.  Successful cardioversion 09/10/2016, 07/29/2017.  Subclavian artery duplex 07/18/2016: Left subclavian artery is abnormal, monophasic, suggestive of proximal obstruction. Left vertebral artery waveform shows high resistance. Antegrade right vertebral artery flow.  Blood Pressure Right: 155/80   Left: 135/75 No hemodynamically significant arterial disease in the common carotid artery bilaterally. Compared to the study done on 05/30/2015, left subclavian artery monophasic waveform now well demonstrated.  Otherwise no significant  change.  Abdominal aortic duplex 02/11/2018: The maximum aorta diameter is 2.47 cm (prox) with mild ectasia. Calcific plaque noted in the abdominal aorta.  Normal flow velocities noted in the bilateral iliac arteries. Compared to 05/24/2015, Maximum aortid dilatation was 2.92. F/U studies if clinically indicated.  Event Monitor: Episodic transmission 02/20/2017 reveals 4 beat run of NSVT at 12:20 a.m. Unscheduled transmission 03/01/17: 4.4 second ventricular standstill at 5am. Rhythm converted from atypical A. Flutter to NSR. No change in therapy for now, continue to observe  Lexiscan myoview stress test: 03/03/2017:                                                                        1. The exercise tolerance was not assessed due to pharmacologic stress. Stress EKG Test Results Normal.     2. Normal SPECT study with normal left ventricular systolic function Low risk study. Normal left ventricular systolic function. No scar or ischemia detected by nuclear imaging.  Carotid artery duplex 05/09/2020:  Minimal stenosis in the right internal carotid artery (1-15%) with heterogeneous plaque. Stenosis in the right external carotid artery (<50%).  Minimal stenosis in the left internal carotid artery (1-15%) with heterogeneous plaque. Stenosis in the left external carotid artery (<50%).  Antegrade right vertebral artery flow. Antegrade left vertebral artery flow.  Follow up studies is appropriate when clinically indicated. Compared to 05/24/2019, right ICA stenosis of 15-49% not present.   Echocardiogram 06/16/2020: Mildly depressed LV systolic function with visual EF 45-50%. Left ventricle cavity is normal in size. Mild hypokinetic global wall motion. Doppler evidence of grade III (restrictive) diastolic dysfunction, elevated LAP. Left atrial cavity is mildly dilated. Right atrial cavity is mildly dilated. Moderate (Grade II) aortic regurgitation. Mild to moderate mitral regurgitation. Mild  tricuspid regurgitation. Moderate pulmonary hypertension. RVSP measures 51 mmHg. Mild pulmonic regurgitation. Insignificant pericardial effusion. IVC is dilated with blunted respiratory response. Compared to prior study dated 03/25/2017: LVEF was 55% now 45-50%, G2DD is now G3DD, otherwise no significant change.    EKG:   EKG 06/30/2020: Atrial fibrillation with controlled ventricular response at rate of 69 bpm, normal axis. Nonspecific T abnormality.   EKG 05/26/2020: Atrial fibrillation with controlled ventricular response at rate of 82 bpm, normal axis, poor R wave progression, probably normal variant.  Nonspecific ST-T abnormality.  Single PVC.  Low voltage -possible pulmonary disease.  Compared to 05/31/2019, normal sinus rhythm and normal EKG.   Assessment     ICD-10-CM   1. Persistent atrial fibrillation (HCC)  I48.19 EKG 41-OINO    Basic metabolic panel  2. Essential hypertension  I10   3. Non-ischemic cardiomyopathy (HCC)  I42.8     CHA2DS2-VASc Score is 5.  Yearly risk of stroke: 6.7% (A, F, HTN, Vasc Dz).  Score of 1=0.6; 2=2.2; 3=3.2; 4=4.8; 5=7.2; 6=9.8; 7=>9.8) -(CHF; HTN; vasc disease DM,  Female = 1; Age <65 =0; 65-74 = 1,  >75 =2; stroke/embolism= 2).    No orders of the defined types were placed in this encounter. There are no discontinued medications.  Orders Placed This Encounter  Procedures  . Basic metabolic panel  . EKG 12-Lead   Recommendations:   ALENNA RUSSELL  is a 86 y.o. female  with paroxysmal atrial fibrillation, s/p cardioversion on 09/2016, 07/29/17, mild to moderate aortic and mitral regurgitation and very mild asymptomatic left subclavian artery stenosis, carotid atherosclerosis, hypertension, and hyperlipidemia.  She also had nonischemic cardiomyopathy with EF 40% when she was in atrial fibrillation which improved back to normal after she maintained sinus rhythm in 2018.  I seen her a month ago and she was back in A. fib, no repeat  echocardiogram again reveals decreased LVEF.  She is tolerating Multaq without any side effects.  In view of persistent atrial fibrillation, I will schedule her for direct-current cardioversion.  As she has failed amiodarone in the past, if she fails Multaq, I would have a low threshold to refer her for atrial fibrillation ablation.  Otherwise blood pressure is well controlled, no clinical evidence of heart failure.  I reassured the patient. Schedule for Direct current cardioversion. I have discussed regarding risks benefits rate control vs rhythm control with the patient. Patient understands cardiac arrest and need for CPR, aspiration pneumonia, but not limited to these. Patient is willing.    Adrian Prows, MD, [facility] 06/30/2020, 1:14 PM Office: [phone] Pager: [phone]

## 2020-06-30 NOTE — Progress Notes (Signed)
Primary Physician/Referring:  Prince Solian, MD  Patient ID: Kimberly Villegas, female    DOB: 01-11-1935, 86 y.o.   MRN: [id number]  Chief Complaint  Patient presents with  . Atrial Fibrillation  . Follow-up    4 weeks  . Hypertension   HPI:    Kimberly Villegas  is a 86 y.o.  female  with paroxysmal atrial fibrillation, s/p cardioversion on 09/2016, 07/29/17, mild to moderate aortic and mitral regurgitation and very mild asymptomatic left subclavian artery stenosis, carotid atherosclerosis, hypertension, and hyperlipidemia.  She also had nonischemic cardiomyopathy with EF 40% when she was in atrial fibrillation which improved back to normal after she maintained sinus rhythm in 2018.  I had seen her a month ago and she was back in A. fib.  I had placed her on Multaq which he is tolerating.  She has noticed mild decreased exercise tolerance since being in A. fib.  Denies chest pain or shortness of breath.  No bleeding diathesis on Eliquis.  Past Medical History:  Diagnosis Date  . A-fib ([place])   . Anemia, chronic disease   . Arthritis   . Asthma   . Bilateral carotid bruits   . CAD (coronary artery disease)   . Cervical stenosis of spine   . Cough   . Diastolic dysfunction   . DVT (deep venous thrombosis) ([place])    after vein stripping years ago  . GERD (gastroesophageal reflux disease)   . Hyperlipemia   . Hypertension   . Insomnia   . New onset a-fib ([place]) 2018  . Seasonal allergies   . Syncope   . Vitamin D deficiency    Past Surgical History:  Procedure Laterality Date  . AMPUTATION TOE Bilateral 07/25/2016   Procedure: bilateral 2nd toe amputations;  Surgeon: Wylene Simmer, MD;  Location: [facility];  Service: Orthopedics;  Laterality: Bilateral;  . BASAL CELL CARCINOMA EXCISION    . CARDIOVERSION N/A 09/10/2016   Procedure: CARDIOVERSION;  Surgeon: Adrian Prows, MD;  Location: [facility];  Service: Cardiovascular;  Laterality: N/A;  . CARDIOVERSION  N/A 07/29/2017   Procedure: CARDIOVERSION;  Surgeon: Nigel Mormon, MD;  Location: [facility];  Service: Cardiovascular;  Laterality: N/A;  . FOOT SURGERY Left   . JOINT REPLACEMENT Right   . VEIN LIGATION AND STRIPPING Right    Social History   Tobacco Use  . Smoking status: Former Smoker    Packs/day: 0.25    Years: 5.00    Pack years: 1.25    Types: Cigarettes    Quit date: 1970    Years since quitting: 52.1  . Smokeless tobacco: Never Used  . Tobacco comment: social  Substance Use Topics  . Alcohol use: Yes    Alcohol/week: 1.0 standard drink    Types: 1 Glasses of wine per week    Comment: occa  Marital Status: Married   ROS  Review of Systems  Cardiovascular: Negative for chest pain, dyspnea on exertion and leg swelling.  Gastrointestinal: Negative for melena.  Psychiatric/Behavioral: The patient has insomnia.    Objective  Blood pressure (!) 113/41, pulse 76, temperature 97.9 F (36.6 C), resp. rate 16, height 5\' 5"  (1.651 m), weight 143 lb (64.9 kg), SpO2 100 %.  Vitals with BMI 06/30/2020 05/26/2020 05/31/2019  Height 5\' 5"  5\' 5"  5\' 5"   Weight 143 lbs 147 lbs 133 lbs 10 oz  BMI 23.8 67.12 45.80  Systolic 998 338 99  Diastolic 41 63 37  Pulse 76  86 73     Physical Exam Neck:     Thyroid: No thyromegaly.  Cardiovascular:     Rate and Rhythm: Normal rate. Rhythm irregular.     Pulses: Normal pulses and intact distal pulses.          Carotid pulses are on the right side with bruit and on the left side with bruit.    Heart sounds: Murmur heard.   Blowing midsystolic murmur is present with a grade of 2/6 at the lower left sternal border. No gallop.      Comments: No leg edema, no JVD. Pulmonary:     Effort: Pulmonary effort is normal.     Breath sounds: Normal breath sounds.  Abdominal:     General: Bowel sounds are normal.     Palpations: Abdomen is soft.  Musculoskeletal:     Cervical back: Neck supple.  Skin:    General: Skin is warm and dry.     Laboratory examination:   No results for input(s): NA, K, CL, CO2, GLUCOSE, BUN, CREATININE, CALCIUM, GFRNONAA, GFRAA in the last 8760 hours. CrCl cannot be calculated (Patient's most recent lab result is older than the maximum 21 days allowed.).  CMP Latest Ref Rng & Units 07/27/2016  Glucose 65 - 99 mg/dL 87  BUN 6 - 20 mg/dL 18  Creatinine 0.44 - 1.00 mg/dL 0.95  Sodium 135 - 145 mmol/L 139  Potassium 3.5 - 5.1 mmol/L 3.9  Chloride 101 - 111 mmol/L 103  CO2 22 - 32 mmol/L 28  Calcium 8.9 - 10.3 mg/dL 9.6   CBC Latest Ref Rng & Units 03/17/2019 07/27/2016  WBC 3.8 - 10.8 Thousand/uL 9.8 6.9  Hemoglobin 11.7 - 15.5 g/dL 11.0(L) 11.9(L)  Hematocrit 35.0 - 45.0 % 33.3(L) 36.2  Platelets 140 - 400 Thousand/uL 175 182   Lipid Panel  No results found for: CHOL, TRIG, HDL, CHOLHDL, VLDL, LDLCALC, LDLDIRECT HEMOGLOBIN A1C No results found for: HGBA1C, MPG TSH No results for input(s): TSH in the last 8760 hours.  External labs:    Cholesterol, total 158.000 m 06/08/2019 HDL 76 MG/DL 06/08/2019 LDL 73.000 mg 06/08/2019 Triglycerides 45.000 06/08/2019  A1C 5.100 % 06/09/2019 TSH 1.960 06/08/2019  Hemoglobin 11.400 g/d 06/08/2019  Creatinine, Serum 1.000 mg/ 06/08/2019 ALT (SGPT) 14.000 uni 06/08/2019   Medications and allergies  No Known Allergies   Current Outpatient Medications on File Prior to Visit  Medication Sig Dispense Refill  . amLODipine (NORVASC) 5 MG tablet TAKE 1 TABLET(5 MG) BY MOUTH DAILY 90 tablet 1  . apixaban (ELIQUIS) 5 MG TABS tablet Take 5 mg by mouth 2 (two) times daily.    . atorvastatin (LIPITOR) 40 MG tablet TAKE 1 TABLET DAILY 90 tablet 3  . Calcium-Vit D-Arg-Inos-Silicon (BONE DENSITY PO) Take by mouth See admin instructions. Take 1 tablet by mouth in the morning and take 2 tablets by mouth in the evening    . cephALEXin (KEFLEX) 500 MG capsule Take 500 mg by mouth 4 (four) times daily. 2000 mg prior to dental procedures    . Cholecalciferol (VITAMIN D) 2000  units tablet Take 2,000 Units by mouth daily.    . dronedarone (MULTAQ) 400 MG tablet Take 1 tablet (400 mg total) by mouth 2 (two) times daily with a meal. 180 tablet 3  . losartan (COZAAR) 50 MG tablet Take 1 tablet by mouth daily.    . Multiple Vitamins-Calcium (ONE-A-DAY WOMENS PO) Take 1 tablet by mouth daily.    . Multiple Vitamins-Minerals (PRESERVISION   AREDS PO) Take 1 capsule by mouth 2 (two) times daily.     . TOPROL XL 50 MG 24 hr tablet Take 50 mg by mouth daily.    . Tretinoin, Facial Wrinkles, 0.02 % CREA Apply 1 application topically every other day.    . zaleplon (SONATA) 5 MG capsule Take 5 mg by mouth at bedtime as needed for sleep.     No current facility-administered medications on file prior to visit.    Radiology:  No results found.  Cardiac Studies:   Sleep Study 2006: Normal.  Lower extremity arterial duplex 05/24/2015:  No hemodynamically significant stenoses are identified in bilateral lower extremity arterial system. Mild plaque noted.  This exam reveals normal perfusion of both  the  lower extremities with RABI >1.14 and LABI 1.07.  Abdominal Ultrasound 02/11/2018: The maximum aorta diameter is 2.47 cm (prox) with mild ectasia. Calcific plaque noted in the abdominal aorta.  Normal flow velocities noted in the bilateral iliac arteries. Compared to 05/24/2015, Maximum aortid dilatation was 2.92. F/U studies if clinically indicated.  Successful cardioversion 09/10/2016, 07/29/2017.  Subclavian artery duplex 07/18/2016: Left subclavian artery is abnormal, monophasic, suggestive of proximal obstruction. Left vertebral artery waveform shows high resistance. Antegrade right vertebral artery flow.  Blood Pressure Right: 155/80   Left: 135/75 No hemodynamically significant arterial disease in the common carotid artery bilaterally. Compared to the study done on 05/30/2015, left subclavian artery monophasic waveform now well demonstrated.  Otherwise no significant  change.  Abdominal aortic duplex 02/11/2018: The maximum aorta diameter is 2.47 cm (prox) with mild ectasia. Calcific plaque noted in the abdominal aorta.  Normal flow velocities noted in the bilateral iliac arteries. Compared to 05/24/2015, Maximum aortid dilatation was 2.92. F/U studies if clinically indicated.  Event Monitor: Episodic transmission 02/20/2017 reveals 4 beat run of NSVT at 12:20 a.m. Unscheduled transmission 03/01/17: 4.4 second ventricular standstill at 5am. Rhythm converted from atypical A. Flutter to NSR. No change in therapy for now, continue to observe  Lexiscan myoview stress test: 03/03/2017:                                                                        1. The exercise tolerance was not assessed due to pharmacologic stress. Stress EKG Test Results Normal.     2. Normal SPECT study with normal left ventricular systolic function Low risk study. Normal left ventricular systolic function. No scar or ischemia detected by nuclear imaging.  Carotid artery duplex 05/09/2020:  Minimal stenosis in the right internal carotid artery (1-15%) with heterogeneous plaque. Stenosis in the right external carotid artery (<50%).  Minimal stenosis in the left internal carotid artery (1-15%) with heterogeneous plaque. Stenosis in the left external carotid artery (<50%).  Antegrade right vertebral artery flow. Antegrade left vertebral artery flow.  Follow up studies is appropriate when clinically indicated. Compared to 05/24/2019, right ICA stenosis of 15-49% not present.   Echocardiogram 06/16/2020: Mildly depressed LV systolic function with visual EF 45-50%. Left ventricle cavity is normal in size. Mild hypokinetic global wall motion. Doppler evidence of grade III (restrictive) diastolic dysfunction, elevated LAP. Left atrial cavity is mildly dilated. Right atrial cavity is mildly dilated. Moderate (Grade II) aortic regurgitation. Mild to moderate mitral regurgitation. Mild  tricuspid regurgitation. Moderate pulmonary hypertension. RVSP measures 51 mmHg. Mild pulmonic regurgitation. Insignificant pericardial effusion. IVC is dilated with blunted respiratory response. Compared to prior study dated 03/25/2017: LVEF was 55% now 45-50%, G2DD is now G3DD, otherwise no significant change.    EKG:   EKG 06/30/2020: Atrial fibrillation with controlled ventricular response at rate of 69 bpm, normal axis. Nonspecific T abnormality.   EKG 05/26/2020: Atrial fibrillation with controlled ventricular response at rate of 82 bpm, normal axis, poor R wave progression, probably normal variant.  Nonspecific ST-T abnormality.  Single PVC.  Low voltage -possible pulmonary disease.  Compared to 05/31/2019, normal sinus rhythm and normal EKG.   Assessment     ICD-10-CM   1. Persistent atrial fibrillation (HCC)  I48.19 EKG [id number]    Basic metabolic panel  2. Essential hypertension  I10   3. Non-ischemic cardiomyopathy (HCC)  I42.8     CHA2DS2-VASc Score is 5.  Yearly risk of stroke: 6.7% (A, F, HTN, Vasc Dz).  Score of 1=0.6; 2=2.2; 3=3.2; 4=4.8; 5=7.2; 6=9.8; 7=>9.8) -(CHF; HTN; vasc disease DM,  Female = 1; Age <65 =0; 65-74 = 1,  >75 =2; stroke/embolism= 2).    No orders of the defined types were placed in this encounter. There are no discontinued medications.  Orders Placed This Encounter  Procedures  . Basic metabolic panel  . EKG 12-Lead   Recommendations:   Kimberly Villegas  is a 86 y.o. female  with paroxysmal atrial fibrillation, s/p cardioversion on 09/2016, 07/29/17, mild to moderate aortic and mitral regurgitation and very mild asymptomatic left subclavian artery stenosis, carotid atherosclerosis, hypertension, and hyperlipidemia.  She also had nonischemic cardiomyopathy with EF 40% when she was in atrial fibrillation which improved back to normal after she maintained sinus rhythm in 2018.  I seen her a month ago and she was back in A. fib, no repeat  echocardiogram again reveals decreased LVEF.  She is tolerating Multaq without any side effects.  In view of persistent atrial fibrillation, I will schedule her for direct-current cardioversion.  As she has failed amiodarone in the past, if she fails Multaq, I would have a low threshold to refer her for atrial fibrillation ablation.  Otherwise blood pressure is well controlled, no clinical evidence of heart failure.  I reassured the patient. Schedule for Direct current cardioversion. I have discussed regarding risks benefits rate control vs rhythm control with the patient. Patient understands cardiac arrest and need for CPR, aspiration pneumonia, but not limited to these. Patient is willing.    Adrian Prows, MD, [facility] 06/30/2020, 1:14 PM Office: [phone] Pager: [phone]

## 2020-07-01 LAB — BASIC METABOLIC PANEL
BUN/Creatinine Ratio: 21 (ref 12–28)
BUN: 26 mg/dL (ref 8–27)
CO2: 21 mmol/L (ref 20–29)
Calcium: 9.5 mg/dL (ref 8.7–10.3)
Chloride: 103 mmol/L (ref 96–106)
Creatinine, Ser: 1.24 mg/dL — ABNORMAL HIGH (ref 0.57–1.00)
GFR calc Af Amer: 46 mL/min/{1.73_m2} — ABNORMAL LOW (ref >59)
GFR calc non Af Amer: 40 mL/min/{1.73_m2} — ABNORMAL LOW (ref >59)
Glucose: 92 mg/dL (ref 65–99)
Potassium: 4.6 mmol/L (ref 3.5–5.2)
Sodium: 140 mmol/L (ref 134–144)

## 2020-07-14 ENCOUNTER — Other Ambulatory Visit: Payer: Self-pay | Admitting: Internal Medicine

## 2020-07-14 DIAGNOSIS — Z0000 Encounter for general adult medical examination without abnormal findings: Secondary | ICD-10-CM

## 2020-07-15 ENCOUNTER — Other Ambulatory Visit (HOSPITAL_COMMUNITY)
Admission: RE | Admit: 2020-07-15 | Discharge: 2020-07-15 | Disposition: A | Discharge status: Home / Self Care | Payer: Medicare HMO | Source: Ambulatory Visit | Attending: Cardiology | Admitting: Cardiology

## 2020-07-15 DIAGNOSIS — Z01812 Encounter for preprocedural laboratory examination: Secondary | ICD-10-CM | POA: Diagnosis present

## 2020-07-15 DIAGNOSIS — Z20822 Contact with and (suspected) exposure to covid-19: Secondary | ICD-10-CM | POA: Diagnosis not present

## 2020-07-15 LAB — SARS CORONAVIRUS 2 (TAT 6-24 HRS): SARS Coronavirus 2: NEGATIVE

## 2020-07-17 ENCOUNTER — Telehealth: Payer: Self-pay

## 2020-07-17 NOTE — Telephone Encounter (Signed)
Not to worry, sent her My Chart Message. Next time you can ask if okay to respond on My Chart

## 2020-07-17 NOTE — Telephone Encounter (Signed)
Patient called stating that she was dry heaving and took Pepto bismol. She took her temp it was 99.1 this morning. She is concerned that she is having cardioversion tomorrow. She just wanted to report this "incident", before her procedure. Is there anything she should be worried about? Please advise. She stated she feels fine now.

## 2020-07-18 ENCOUNTER — Ambulatory Visit (HOSPITAL_COMMUNITY): Payer: Medicare HMO | Admitting: Certified Registered Nurse Anesthetist

## 2020-07-18 ENCOUNTER — Encounter (HOSPITAL_COMMUNITY): Payer: Self-pay | Admitting: Cardiology

## 2020-07-18 ENCOUNTER — Encounter (HOSPITAL_COMMUNITY)
Admission: RE | Disposition: A | Discharge status: Home / Self Care | Payer: Self-pay | Source: Home / Self Care | Attending: Cardiology

## 2020-07-18 ENCOUNTER — Other Ambulatory Visit: Payer: Self-pay

## 2020-07-18 ENCOUNTER — Ambulatory Visit (HOSPITAL_COMMUNITY)
Admission: RE | Admit: 2020-07-18 | Discharge: 2020-07-18 | Disposition: A | Discharge status: Home / Self Care | Payer: Medicare HMO | Attending: Cardiology | Admitting: Cardiology

## 2020-07-18 DIAGNOSIS — Z79899 Other long term (current) drug therapy: Secondary | ICD-10-CM | POA: Insufficient documentation

## 2020-07-18 DIAGNOSIS — I10 Essential (primary) hypertension: Secondary | ICD-10-CM | POA: Diagnosis not present

## 2020-07-18 DIAGNOSIS — I428 Other cardiomyopathies: Secondary | ICD-10-CM | POA: Insufficient documentation

## 2020-07-18 DIAGNOSIS — Z7901 Long term (current) use of anticoagulants: Secondary | ICD-10-CM | POA: Insufficient documentation

## 2020-07-18 DIAGNOSIS — I4819 Other persistent atrial fibrillation: Secondary | ICD-10-CM | POA: Diagnosis not present

## 2020-07-18 DIAGNOSIS — Z87891 Personal history of nicotine dependence: Secondary | ICD-10-CM | POA: Diagnosis not present

## 2020-07-18 DIAGNOSIS — E785 Hyperlipidemia, unspecified: Secondary | ICD-10-CM | POA: Insufficient documentation

## 2020-07-18 DIAGNOSIS — I484 Atypical atrial flutter: Secondary | ICD-10-CM

## 2020-07-18 HISTORY — PX: CARDIOVERSION: SHX1299

## 2020-07-18 LAB — POCT I-STAT, CHEM 8
BUN: 31 mg/dL — ABNORMAL HIGH (ref 8–23)
Calcium, Ion: 1.2 mmol/L (ref 1.15–1.40)
Chloride: 107 mmol/L (ref 98–111)
Creatinine, Ser: 1 mg/dL (ref 0.44–1.00)
Glucose, Bld: 95 mg/dL (ref 70–99)
HCT: 34 % — ABNORMAL LOW (ref 36.0–46.0)
Hemoglobin: 11.6 g/dL — ABNORMAL LOW (ref 12.0–15.0)
Potassium: 4.3 mmol/L (ref 3.5–5.1)
Sodium: 140 mmol/L (ref 135–145)
TCO2: 26 mmol/L (ref 22–32)

## 2020-07-18 SURGERY — CARDIOVERSION
Anesthesia: General

## 2020-07-18 MED ORDER — PROPOFOL 10 MG/ML IV BOLUS
INTRAVENOUS | Status: DC | PRN
  Administered 2020-07-18: 60 mg via INTRAVENOUS

## 2020-07-18 MED ORDER — LIDOCAINE 2% (20 MG/ML) 5 ML SYRINGE
INTRAMUSCULAR | Status: DC | PRN
  Administered 2020-07-18: 70 mg via INTRAVENOUS

## 2020-07-18 MED ORDER — SODIUM CHLORIDE 0.9 % IV SOLN: INTRAVENOUS | Status: DC | PRN

## 2020-07-18 MED ORDER — SODIUM CHLORIDE 0.9 % IV SOLN: Freq: Once | INTRAVENOUS | Status: AC

## 2020-07-18 NOTE — Interval H&P Note (Signed)
History and Physical Interval Note:  07/18/2020 1:17 PM  Kimberly Villegas  has presented today for surgery, with the diagnosis of AFIB.  The various methods of treatment have been discussed with the patient and family. After consideration of risks, benefits and other options for treatment, the patient has consented to  Procedure(s): CARDIOVERSION (N/A) as a surgical intervention.  The patient's history has been reviewed, patient examined, no change in status, stable for surgery.  I have reviewed the patient's chart and labs.  Questions were answered to the patient's satisfaction.     Adrian Prows

## 2020-07-18 NOTE — CV Procedure (Signed)
Direct current cardioversion:  Indication symptomatic A. Fibrillation/atypical atrial flutter.  Procedure: Using 60 mg of IV Propofol and 70 IV Lidocaine (for reducing venous pain) for achieving deep sedation, synchronized direct current cardioversion performed. Patient was delivered with 75 Joules of electricity X 1 with success to NSR. Patient tolerated the procedure well. No immediate complication noted.    Patient in A. FL on presentation converted with 1 shock. No complications.   Adrian Prows, MD, [facility] 07/18/2020, 1:33 PM Office: [phone] Pager: [phone]

## 2020-07-18 NOTE — Anesthesia Preprocedure Evaluation (Addendum)
Anesthesia Evaluation  Patient identified by MRN, date of birth, ID band Patient awake    Reviewed: Allergy & Precautions, NPO status , Patient's Chart, lab work & pertinent test results  Airway Mallampati: II  TM Distance: >3 FB Neck ROM: Full    Dental  (+) Dental Advisory Given, Teeth Intact, Chipped   Pulmonary asthma , former smoker,    Pulmonary exam normal breath sounds clear to auscultation       Cardiovascular hypertension, Pt. on medications and Pt. on home beta blockers + CAD and + DVT  Normal cardiovascular exam+ dysrhythmias Atrial Fibrillation  Rhythm:Regular Rate:Normal  Echo 2018 EF 40-45%, Mild to moderate AI, MR and TR. Mild pulmonary HTN   Neuro/Psych negative neurological ROS  negative psych ROS   GI/Hepatic Neg liver ROS, GERD  ,  Endo/Other  negative endocrine ROS  Renal/GU negative Renal ROS     Musculoskeletal  (+) Arthritis ,   Abdominal   Peds  Hematology  (+) anemia , HLD   Anesthesia Other Findings   Reproductive/Obstetrics                            Anesthesia Physical  Anesthesia Plan  ASA: III  Anesthesia Plan: General   Post-op Pain Management:    Induction: Intravenous  PONV Risk Score and Plan: 3 and Propofol infusion, Treatment may vary due to age or medical condition and TIVA  Airway Management Planned: Mask  Additional Equipment: None  Intra-op Plan:   Post-operative Plan:   Informed Consent: I have reviewed the patients History and Physical, chart, labs and discussed the procedure including the risks, benefits and alternatives for the proposed anesthesia with the patient or authorized representative who has indicated his/her understanding and acceptance.     Dental advisory given  Plan Discussed with: CRNA  Anesthesia Plan Comments:        Anesthesia Quick Evaluation

## 2020-07-18 NOTE — Transfer of Care (Signed)
Immediate Anesthesia Transfer of Care Note  Patient: Kimberly Villegas  Procedure(s) Performed: CARDIOVERSION (N/A )  Patient Location: Endoscopy Unit  Anesthesia Type:General  Level of Consciousness: awake, alert  and oriented  Airway & Oxygen Therapy: Patient Spontanous Breathing  Post-op Assessment: Report given to RN and Post -op Vital signs reviewed and stable  Post vital signs: Reviewed and stable  Last Vitals:  Vitals Value Taken Time  BP 127/53   Temp    Pulse 75   Resp 12   SpO2 96     Last Pain:  Vitals:   07/18/20 1253  PainSc: 0-No pain         Complications: No complications documented.

## 2020-07-18 NOTE — Anesthesia Procedure Notes (Signed)
Procedure Name: MAC Date/Time: 07/18/2020 1:21 PM Performed by: Dorthea Cove, CRNA Pre-anesthesia Checklist: Patient identified, Emergency Drugs available, Suction available and Patient being monitored Patient Re-evaluated:Patient Re-evaluated prior to induction Oxygen Delivery Method: Ambu bag Preoxygenation: Pre-oxygenation with 100% oxygen Induction Type: IV induction Ventilation: Mask ventilation without difficulty Airway Equipment and Method: Oral airway Placement Confirmation: positive ETCO2 and breath sounds checked- equal and bilateral Dental Injury: Teeth and Oropharynx as per pre-operative assessment

## 2020-07-18 NOTE — Discharge Instructions (Signed)

## 2020-07-19 ENCOUNTER — Encounter (HOSPITAL_COMMUNITY): Payer: Self-pay | Admitting: Cardiology

## 2020-07-19 NOTE — Anesthesia Postprocedure Evaluation (Signed)
Anesthesia Post Note  Patient: Kimberly Villegas  Procedure(s) Performed: CARDIOVERSION (N/A )     Patient location during evaluation: PACU Anesthesia Type: General Level of consciousness: sedated and patient cooperative Pain management: pain level controlled Vital Signs Assessment: post-procedure vital signs reviewed and stable Respiratory status: spontaneous breathing Cardiovascular status: stable Anesthetic complications: no   No complications documented.  Last Vitals:  Vitals:   07/18/20 1350 07/18/20 1357  BP: (!) 123/37 (!) 142/36  Pulse: 77 78  Resp: 14 (!) 21  Temp:    SpO2: 97% 98%    Last Pain:  Vitals:   07/18/20 1357  TempSrc:   PainSc: 0-No pain                 Nolon Nations

## 2020-07-24 ENCOUNTER — Other Ambulatory Visit: Payer: Self-pay

## 2020-07-24 ENCOUNTER — Encounter: Payer: Self-pay | Admitting: Cardiology

## 2020-07-24 ENCOUNTER — Ambulatory Visit: Payer: Medicare HMO | Admitting: Cardiology

## 2020-07-24 VITALS — BP 92/36 | HR 70 | Temp 98.1°F | Resp 16 | Ht 65.0 in | Wt 143.4 lb

## 2020-07-24 DIAGNOSIS — I480 Paroxysmal atrial fibrillation: Secondary | ICD-10-CM

## 2020-07-24 DIAGNOSIS — I428 Other cardiomyopathies: Secondary | ICD-10-CM

## 2020-07-24 DIAGNOSIS — I10 Essential (primary) hypertension: Secondary | ICD-10-CM

## 2020-07-24 NOTE — Progress Notes (Signed)
Primary Physician/Referring:  Prince Solian, MD  Patient ID: Kimberly Villegas, female    DOB: 03/03/1935, 86 y.o.   MRN: [id number]  Chief Complaint  Patient presents with  . Atrial Fibrillation  . Follow-up    4 weeks   HPI:    Kimberly Villegas  is a 86 y.o.  female  with paroxysmal atrial fibrillation, s/p cardioversion on 09/2016, 07/29/17, mild to moderate aortic and mitral regurgitation and very mild asymptomatic left subclavian artery stenosis, carotid atherosclerosis, hypertension, and hyperlipidemia.  She also had nonischemic cardiomyopathy with EF 40% when she was in atrial fibrillation which improved back to normal after she maintained sinus rhythm in 2018.  Due to mildly reduced LVEF and fatigue and dyspnea, underwent direct-current cardioversion on 07/18/2020 which was successful.  She now presents for follow-up, has noticed improvement in overall wellbeing.  She is tolerating Multaq without any side effects.  Accompanied by her husband.  Past Medical History:  Diagnosis Date  . A-fib ([place])   . Anemia, chronic disease   . Arthritis   . Asthma   . Bilateral carotid bruits   . CAD (coronary artery disease)   . Cervical stenosis of spine   . Cough   . Diastolic dysfunction   . DVT (deep venous thrombosis) ([place])    after vein stripping years ago  . GERD (gastroesophageal reflux disease)   . Hyperlipemia   . Hypertension   . Insomnia   . New onset a-fib ([place]) 2018  . Seasonal allergies   . Syncope   . Vitamin D deficiency    Past Surgical History:  Procedure Laterality Date  . AMPUTATION TOE Bilateral 07/25/2016   Procedure: bilateral 2nd toe amputations;  Surgeon: Wylene Simmer, MD;  Location: [facility];  Service: Orthopedics;  Laterality: Bilateral;  . BASAL CELL CARCINOMA EXCISION    . CARDIOVERSION N/A 09/10/2016   Procedure: CARDIOVERSION;  Surgeon: Adrian Prows, MD;  Location: [facility];  Service: Cardiovascular;  Laterality: N/A;  .  CARDIOVERSION N/A 07/29/2017   Procedure: CARDIOVERSION;  Surgeon: Nigel Mormon, MD;  Location: [facility];  Service: Cardiovascular;  Laterality: N/A;  . CARDIOVERSION N/A 07/18/2020   Procedure: CARDIOVERSION;  Surgeon: Adrian Prows, MD;  Location: [facility];  Service: Cardiovascular;  Laterality: N/A;  . FOOT SURGERY Left   . JOINT REPLACEMENT Right   . VEIN LIGATION AND STRIPPING Right    Social History   Tobacco Use  . Smoking status: Former Smoker    Packs/day: 0.25    Years: 5.00    Pack years: 1.25    Types: Cigarettes    Quit date: 1970    Years since quitting: 52.2  . Smokeless tobacco: Never Used  . Tobacco comment: social  Substance Use Topics  . Alcohol use: Not Currently    Alcohol/week: 1.0 standard drink    Types: 1 Glasses of wine per week    Comment: Occassionally  Marital Status: Married   ROS  Review of Systems  Cardiovascular: Negative for chest pain, dyspnea on exertion and leg swelling.  Gastrointestinal: Negative for melena.  Psychiatric/Behavioral: The patient has insomnia.    Objective  Blood pressure (!) 92/36, pulse 70, temperature 98.1 F (36.7 C), temperature source Temporal, resp. rate 16, height 5\' 5"  (1.651 m), weight 143 lb 6.4 oz (65 kg), SpO2 97 %.  Vitals with BMI 07/24/2020 07/24/2020 07/18/2020  Height - 5\' 5"  -  Weight - 143 lbs 6 oz -  BMI - 23.86 -  Systolic 92 76 756  Diastolic 36 39 36  Pulse 70 71 78     Physical Exam Neck:     Thyroid: No thyromegaly.  Cardiovascular:     Rate and Rhythm: Normal rate and regular rhythm.     Pulses: Normal pulses and intact distal pulses.          Carotid pulses are on the right side with bruit and on the left side with bruit.    Heart sounds: Murmur heard.   Blowing midsystolic murmur is present with a grade of 2/6 at the lower left sternal border. No gallop.      Comments: No leg edema, no JVD. Pulmonary:     Effort: Pulmonary effort is normal.     Breath sounds: Normal  breath sounds.  Abdominal:     General: Bowel sounds are normal.     Palpations: Abdomen is soft.  Musculoskeletal:     Cervical back: Neck supple.  Skin:    General: Skin is warm and dry.    Laboratory examination:   Recent Labs    06/30/20 1454 07/18/20 1247  NA 140 140  K 4.6 4.3  CL 103 107  CO2 21  --   GLUCOSE 92 95  BUN 26 31*  CREATININE 1.24* 1.00  CALCIUM 9.5  --   GFRNONAA 40*  --   GFRAA 46*  --    estimated creatinine clearance is 38.4 mL/min (by C-G formula based on SCr of 1 mg/dL).  CMP Latest Ref Rng & Units 07/18/2020 06/30/2020 07/27/2016  Glucose 70 - 99 mg/dL 95 92 87  BUN 8 - 23 mg/dL 31(H) 26 18  Creatinine 0.44 - 1.00 mg/dL 1.00 1.24(H) 0.95  Sodium 135 - 145 mmol/L 140 140 139  Potassium 3.5 - 5.1 mmol/L 4.3 4.6 3.9  Chloride 98 - 111 mmol/L 107 103 103  CO2 20 - 29 mmol/L - 21 28  Calcium 8.7 - 10.3 mg/dL - 9.5 9.6   CBC Latest Ref Rng & Units 07/18/2020 03/17/2019 07/27/2016  WBC 3.8 - 10.8 Thousand/uL - 9.8 6.9  Hemoglobin 12.0 - 15.0 g/dL 11.6(L) 11.0(L) 11.9(L)  Hematocrit 36.0 - 46.0 % 34.0(L) 33.3(L) 36.2  Platelets 140 - 400 Thousand/uL - 175 182   Lipid Panel  No results found for: CHOL, TRIG, HDL, CHOLHDL, VLDL, LDLCALC, LDLDIRECT HEMOGLOBIN A1C No results found for: HGBA1C, MPG TSH No results for input(s): TSH in the last 8760 hours.  External labs:    Cholesterol, total 158.000 m 06/08/2019 HDL 76 MG/DL 06/08/2019 LDL 73.000 mg 06/08/2019 Triglycerides 45.000 06/08/2019  A1C 5.100 % 06/09/2019 TSH 1.960 06/08/2019  Hemoglobin 11.400 g/d 06/08/2019  Creatinine, Serum 1.000 mg/ 06/08/2019 ALT (SGPT) 14.000 uni 06/08/2019   Medications and allergies  No Known Allergies   Current Outpatient Medications on File Prior to Visit  Medication Sig Dispense Refill  . amLODipine (NORVASC) 5 MG tablet TAKE 1 TABLET(5 MG) BY MOUTH DAILY (Patient taking differently: Take 5 mg by mouth daily after breakfast.) 90 tablet 1  . apixaban (ELIQUIS) 5 MG  TABS tablet Take 5 mg by mouth 2 (two) times daily after a meal.    . atorvastatin (LIPITOR) 40 MG tablet TAKE 1 TABLET DAILY (Patient taking differently: Take 40 mg by mouth at bedtime.) 90 tablet 3  . Calcium-Vit D-Arg-Inos-Silicon (BONE DENSITY PO) Take 1-2 tablets by mouth See admin instructions. Take 1 tablet by mouth in the morning after breakfast and take 2 tablets by mouth in the evening  after supper    . Cholecalciferol (VITAMIN D) 2000 units tablet Take 2,000 Units by mouth 2 (two) times daily after a meal.    . dronedarone (MULTAQ) 400 MG tablet Take 1 tablet (400 mg total) by mouth 2 (two) times daily with a meal. (Patient taking differently: Take 400 mg by mouth 2 (two) times daily after a meal.) 180 tablet 3  . Glycerin-Hypromellose-PEG 400 (DRY EYE RELIEF DROPS) 0.2-0.2-1 % SOLN Place 2 drops into both eyes in the morning and at bedtime.    Marland Kitchen losartan (COZAAR) 50 MG tablet Take 50 mg by mouth at bedtime.    . Multiple Vitamins-Minerals (PRESERVISION AREDS PO) Take 1 capsule by mouth 2 (two) times daily after a meal.    . Probiotic Product (ALIGN) 4 MG CAPS Take 4 mg by mouth daily as needed (bloating).    . TOPROL XL 50 MG 24 hr tablet Take 50 mg by mouth daily after breakfast.    . Wheat Dextrin (BENEFIBER ON THE GO) POWD Take by mouth daily with breakfast.    . zaleplon (SONATA) 5 MG capsule Take 5 mg by mouth at bedtime as needed for sleep.     No current facility-administered medications on file prior to visit.    Radiology:  No results found.  Cardiac Studies:   Sleep Study 2006: Normal.  Lower extremity arterial duplex 05/24/2015:  No hemodynamically significant stenoses are identified in bilateral lower extremity arterial system. Mild plaque noted.  This exam reveals normal perfusion of both  the  lower extremities with RABI >1.14 and LABI 1.07.  Abdominal Ultrasound 02/11/2018: The maximum aorta diameter is 2.47 cm (prox) with mild ectasia. Calcific plaque noted in  the abdominal aorta.  Normal flow velocities noted in the bilateral iliac arteries. Compared to 05/24/2015, Maximum aortid dilatation was 2.92. F/U studies if clinically indicated.  Successful cardioversion 09/10/2016, 07/29/2017.  Subclavian artery duplex 07/18/2016: Left subclavian artery is abnormal, monophasic, suggestive of proximal obstruction. Left vertebral artery waveform shows high resistance. Antegrade right vertebral artery flow.  Blood Pressure Right: 155/80   Left: 135/75 No hemodynamically significant arterial disease in the common carotid artery bilaterally. Compared to the study done on 05/30/2015, left subclavian artery monophasic waveform now well demonstrated.  Otherwise no significant change.  Abdominal aortic duplex 02/11/2018: The maximum aorta diameter is 2.47 cm (prox) with mild ectasia. Calcific plaque noted in the abdominal aorta.  Normal flow velocities noted in the bilateral iliac arteries. Compared to 05/24/2015, Maximum aortid dilatation was 2.92. F/U studies if clinically indicated.  Event Monitor: Episodic transmission 02/20/2017 reveals 4 beat run of NSVT at 12:20 a.m. Unscheduled transmission 03/01/17: 4.4 second ventricular standstill at 5am. Rhythm converted from atypical A. Flutter to NSR. No change in therapy for now, continue to observe  Lexiscan myoview stress test: 03/03/2017:                                                                        1. The exercise tolerance was not assessed due to pharmacologic stress. Stress EKG Test Results Normal.     2. Normal SPECT study with normal left ventricular systolic function Low risk study. Normal left ventricular systolic function. No scar or ischemia detected by nuclear imaging.  Carotid artery duplex 05/09/2020:  Minimal stenosis in the right internal carotid artery (1-15%) with heterogeneous plaque. Stenosis in the right external carotid artery (<50%).  Minimal stenosis in the left internal carotid  artery (1-15%) with heterogeneous plaque. Stenosis in the left external carotid artery (<50%).  Antegrade right vertebral artery flow. Antegrade left vertebral artery flow.  Follow up studies is appropriate when clinically indicated. Compared to 05/24/2019, right ICA stenosis of 15-49% not present.   Echocardiogram 06/16/2020: Mildly depressed LV systolic function with visual EF 45-50%. Left ventricle cavity is normal in size. Mild hypokinetic global wall motion. Doppler evidence of grade III (restrictive) diastolic dysfunction, elevated LAP. Left atrial cavity is mildly dilated. Right atrial cavity is mildly dilated. Moderate (Grade II) aortic regurgitation. Mild to moderate mitral regurgitation. Mild tricuspid regurgitation. Moderate pulmonary hypertension. RVSP measures 51 mmHg. Mild pulmonic regurgitation. Insignificant pericardial effusion. IVC is dilated with blunted respiratory response. Compared to prior study dated 03/25/2017: LVEF was 55% now 45-50%, G2DD is now G3DD, otherwise no significant change.    EKG:   EKG 07/24/2020: Normal sinus rhythm at rate of 67 bpm, normal axis.  Incomplete right bundle branch block.  Poor R wave progression, probably normal variant but cannot exclude anteroseptal infarct old. Low voltage.  No evidence of ischemia.  Baseline artifact.    EKG 06/30/2020: Atrial fibrillation with controlled ventricular response at rate of 69 bpm, normal axis. Nonspecific T abnormality.   EKG 05/26/2020: Atrial fibrillation with controlled ventricular response at rate of 82 bpm, normal axis, poor R wave progression, probably normal variant.  Nonspecific ST-T abnormality.  Single PVC.  Low voltage -possible pulmonary disease.  Compared to 05/31/2019, normal sinus rhythm and normal EKG.   Assessment     ICD-10-CM   1. Paroxysmal atrial fibrillation (HCC)  I48.0 EKG 12-Lead    PCV ECHOCARDIOGRAM COMPLETE  2. Essential hypertension  I10   3. Non-ischemic cardiomyopathy  (HCC)  I42.8 PCV ECHOCARDIOGRAM COMPLETE    CHA2DS2-VASc Score is 5.  Yearly risk of stroke: 6.7% (A, F, HTN, Vasc Dz).  Score of 1=0.6; 2=2.2; 3=3.2; 4=4.8; 5=7.2; 6=9.8; 7=>9.8) -(CHF; HTN; vasc disease DM,  Female = 1; Age <65 =0; 65-74 = 1,  >75 =2; stroke/embolism= 2).    No orders of the defined types were placed in this encounter.  Medications Discontinued During This Encounter  Medication Reason  . cephALEXin (KEFLEX) 500 MG capsule Completed Course    Orders Placed This Encounter  Procedures  . EKG 12-Lead  . PCV ECHOCARDIOGRAM COMPLETE    Standing Status:   Future    Standing Expiration Date:   07/24/2021   Recommendations:   MARAL LAMPE  is a 86 y.o. female  with paroxysmal atrial fibrillation, s/p cardioversion on 09/2016, 07/29/17, mild to moderate aortic and mitral regurgitation and very mild asymptomatic left subclavian artery stenosis, carotid atherosclerosis, hypertension, and hyperlipidemia.  She also had nonischemic cardiomyopathy with EF 40% when she was in atrial fibrillation which improved back to normal after she maintained sinus rhythm in 2018.  Due to mildly reduced LVEF and fatigue and dyspnea, underwent direct-current cardioversion on 07/18/2020 which was successful.  She now presents for follow-up, has noticed improvement in overall wellbeing.  She is maintaining sinus rhythm.  Advised her to continue present medical therapy.  Blood pressure is also well controlled.  Although blood pressure was low today, overall she is completely asymptomatic without any dizziness.  I would like to repeat her echocardiogram in 6 months and see  her back at that time.   Adrian Prows, MD, [facility] 07/24/2020, 9:40 AM Office: [phone] Pager: [phone]

## 2020-09-04 ENCOUNTER — Encounter: Payer: Self-pay | Admitting: Podiatry

## 2020-09-04 ENCOUNTER — Other Ambulatory Visit: Payer: Self-pay

## 2020-09-04 ENCOUNTER — Ambulatory Visit (INDEPENDENT_AMBULATORY_CARE_PROVIDER_SITE_OTHER): Payer: Medicare HMO | Admitting: Podiatry

## 2020-09-04 DIAGNOSIS — R140 Abdominal distension (gaseous): Secondary | ICD-10-CM | POA: Insufficient documentation

## 2020-09-04 DIAGNOSIS — Z89429 Acquired absence of other toe(s), unspecified side: Secondary | ICD-10-CM

## 2020-09-04 DIAGNOSIS — R58 Hemorrhage, not elsewhere classified: Secondary | ICD-10-CM

## 2020-09-04 DIAGNOSIS — B351 Tinea unguium: Secondary | ICD-10-CM | POA: Diagnosis not present

## 2020-09-04 DIAGNOSIS — M79674 Pain in right toe(s): Secondary | ICD-10-CM | POA: Diagnosis not present

## 2020-09-04 DIAGNOSIS — M79675 Pain in left toe(s): Secondary | ICD-10-CM

## 2020-09-07 NOTE — Progress Notes (Signed)
  Subjective:  Patient ID: Kimberly Villegas, female    DOB: Jun 20, 1934,  MRN: [id number]  Kimberly Villegas presents to clinic today for at risk foot care. 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  She relates no concerns on today's visit.  She has a bruise on the medial aspect of her left heel.  She states that it has been present for several days.  She can relate no new episodes of trauma to her foot.  It is not painful.  She states she has been wearing a foot wrap daily and even wearing it to bed.  She states that he has improved over the last few days.  No Known Allergies  Review of Systems: Negative except as noted in the HPI. Objective:   Constitutional Kimberly Villegas is a pleasant 86 y.o. Caucasian female, WD, WN in NAD. AAO x 3.   Vascular Capillary refill time to digits immediate b/l. Palpable pedal pulses b/l LE. Lower extremity skin temperature gradient within normal limits. No pain with calf compression b/l. Varicosities present b/l. No cyanosis or clubbing noted.  Neurologic Normal speech. Oriented to person, place, and time. Protective sensation intact 5/5 intact bilaterally with 10g monofilament b/l.  Dermatologic Pedal skin with normal turgor, texture and tone bilaterally. No open wounds bilaterally. No interdigital macerations bilaterally. Toenails 1-5 b/l elongated, discolored, dystrophic, thickened, crumbly with subungual debris and tenderness to dorsal palpation. No hyperkeratotic nor porokeratotic lesions present on today's visit.  Orthopedic: Normal muscle strength 5/5 to all lower extremity muscle groups bilaterally. No pain crepitus or joint limitation noted with ROM b/l. Lower extremity amputation(s): digital amputation L 2nd toe and R 2nd toe.  She does have ecchymosis noted on the medial aspect of her left heel.  It appears to be resolving.  There is no tenderness to palpation.  There is no  warmth nor erythema.  There is no fluctuance.   Radiographs: None Assessment:  No diagnosis found. Plan:  Patient was evaluated and treated and all questions answered.  Onychomycosis with pain -Nails palliatively debridement as below -Educated on self-care  Procedure: Nail Debridement Rationale: Pain Type of Debridement: manual, sharp debridement. Instrumentation: Nail nipper, rotary burr. Number of Nails: 8 -Examined patient. -Patient to continue soft, supportive shoe gear daily. -Toenails L hallux, L 3rd toe, L 4th toe, L 5th toe, R hallux, R 3rd toe, R 4th toe and R 5th toe debrided in length and girth without iatrogenic bleeding with sterile nail nipper and dremel.  -She has no porokeratotic lesions on today's visit. -Patient to report any pedal injuries to medical professional immediately. -Regarding release of the left heel, it appears to be resolving and we will monitor for now.  We also discussed the fact that she is on blood thinners and perhaps may have bumped her foot.  Patient instructed to call should she notice any change in her left foot.  She was instructed to wear her foot wrap during the day and she may remove it at night. -Patient/POA to call should there be question/concern in the interim.  Return in about 9 weeks (around 11/06/2020).  Marzetta Board, DPM

## 2020-09-22 ENCOUNTER — Ambulatory Visit: Payer: Medicare HMO

## 2020-09-23 ENCOUNTER — Ambulatory Visit
Admission: RE | Admit: 2020-09-23 | Discharge: 2020-09-23 | Disposition: A | Discharge status: Home / Self Care | Payer: Medicare HMO | Source: Ambulatory Visit | Attending: Internal Medicine | Admitting: Internal Medicine

## 2020-09-23 ENCOUNTER — Other Ambulatory Visit: Payer: Self-pay

## 2020-09-23 DIAGNOSIS — Z0000 Encounter for general adult medical examination without abnormal findings: Secondary | ICD-10-CM

## 2020-10-05 NOTE — Progress Notes (Signed)
Primary Physician/Referring:  Prince Solian, MD  Patient ID: Kimberly Villegas, female    DOB: 1934/07/26, 86 y.o.   MRN: [id number]  Chief Complaint  Patient presents with  . Atrial Fibrillation  . Dizziness   HPI:    Kimberly Villegas  is a 86 y.o.  female  with paroxysmal atrial fibrillation, s/p cardioversion on 09/2016, 07/29/17, mild to moderate aortic and mitral regurgitation and very mild asymptomatic left subclavian artery stenosis, carotid atherosclerosis, hypertension, and hyperlipidemia.  She also had nonischemic cardiomyopathy with EF 40% when she was in atrial fibrillation which improved back to normal after she maintained sinus rhythm in 2018.  Due to mildly reduced LVEF and fatigue and dyspnea, underwent direct-current cardioversion on 07/18/2020 which was successful. She is tolerating Multaq without any side effects.  Patient presents for urgent visit with concerns of dizziness.  Patient reports intermittent dizziness particularly with positional changes for the last 1 year.  However over the last 2-3 months this has been occurring daily.  Patient denies syncope, near syncope, symptoms suggestive of TIA or CVA.  Denies chest pain, palpitations, orthopnea, PND, dyspnea.  She does monitor her blood pressure at home, and reports that it has been low over the last few months as well.  Past Medical History:  Diagnosis Date  . A-fib ([place])   . Anemia, chronic disease   . Arthritis   . Asthma   . Bilateral carotid bruits   . CAD (coronary artery disease)   . Cervical stenosis of spine   . Cough   . Diastolic dysfunction   . DVT (deep venous thrombosis) ([place])    after vein stripping years ago  . GERD (gastroesophageal reflux disease)   . Hyperlipemia   . Hypertension   . Insomnia   . New onset a-fib ([place]) 2018  . Seasonal allergies   . Syncope   . Vitamin D deficiency    Past Surgical History:  Procedure Laterality Date  . AMPUTATION TOE Bilateral 07/25/2016    Procedure: bilateral 2nd toe amputations;  Surgeon: Wylene Simmer, MD;  Location: [place];  Service: Orthopedics;  Laterality: Bilateral;  . BASAL CELL CARCINOMA EXCISION    . CARDIOVERSION N/A 09/10/2016   Procedure: CARDIOVERSION;  Surgeon: Adrian Prows, MD;  Location: [facility];  Service: Cardiovascular;  Laterality: N/A;  . CARDIOVERSION N/A 07/29/2017   Procedure: CARDIOVERSION;  Surgeon: Nigel Mormon, MD;  Location: [place] [facility];  Service: Cardiovascular;  Laterality: N/A;  . CARDIOVERSION N/A 07/18/2020   Procedure: CARDIOVERSION;  Surgeon: Adrian Prows, MD;  Location: [place];  Service: Cardiovascular;  Laterality: N/A;  . FOOT SURGERY Left   . JOINT REPLACEMENT Right   . VEIN LIGATION AND STRIPPING Right    Family History  Problem Relation Age of Onset  . Heart disease Mother   . Stroke Mother   . Colon cancer Father   . Healthy Child   . Healthy Child   . Healthy Child   . Healthy Grandchild   . Healthy Grandchild   . Healthy Grandchild   . Healthy Grandchild   . Healthy Grandchild   . Heart disease Brother    Social History   Tobacco Use  . Smoking status: Former Smoker    Packs/day: 0.25    Years: 5.00    Pack years: 1.25    Types: Cigarettes    Quit date: 1970    Years since quitting: 52.4  . Smokeless tobacco: Never Used  . Tobacco comment:  social  Substance Use Topics  . Alcohol use: Not Currently    Alcohol/week: 1.0 standard drink    Types: 1 Glasses of wine per week    Comment: Occassionally  Marital Status: Married   ROS  Review of Systems  Constitutional: Negative for malaise/fatigue and weight gain.  Cardiovascular: Negative for chest pain, claudication, dyspnea on exertion, leg swelling, near-syncope, orthopnea, palpitations, paroxysmal nocturnal dyspnea and syncope.  Respiratory: Negative for shortness of breath.   Hematologic/Lymphatic: Does not bruise/bleed easily.  Gastrointestinal: Negative for melena.   Neurological: Positive for dizziness.  Psychiatric/Behavioral: The patient has insomnia.    Objective  Blood pressure (!) 128/55, pulse 72, temperature 98 F (36.7 C), resp. rate 16, height 5\' 5"  (1.651 m), weight 139 lb (63 kg), SpO2 98 %.  Vitals with BMI 10/09/2020 07/24/2020 07/24/2020  Height 5\' 5"  - 5\' 5"   Weight 139 lbs - 143 lbs 6 oz  BMI 92.44 - 62.86  Systolic 381 92 76  Diastolic 55 36 39  Pulse 72 70 71    Orthostatic VS for the past 72 hrs (Last 3 readings):  Orthostatic BP Patient Position BP Location Cuff Size Orthostatic Pulse  10/09/20 0840 101/46 Standing Left Arm Normal 74  10/09/20 0839 128/55 Sitting Left Arm Normal 72  10/09/20 0830 119/56 Supine Left Arm Normal 76     Physical Exam Vitals reviewed.  Constitutional:      General: She is not in acute distress. Neck:     Thyroid: No thyromegaly.  Cardiovascular:     Rate and Rhythm: Normal rate and regular rhythm.     Pulses: Normal pulses and intact distal pulses.          Carotid pulses are on the right side with bruit and on the left side with bruit.    Heart sounds: Murmur heard.   Blowing midsystolic murmur is present with a grade of 2/6 at the lower left sternal border. No gallop.      Comments: No leg edema, no JVD. Pulmonary:     Effort: Pulmonary effort is normal. No respiratory distress.     Breath sounds: Normal breath sounds. No wheezing or rales.  Musculoskeletal:     Cervical back: Neck supple.  Skin:    General: Skin is warm and dry.  Neurological:     Mental Status: She is alert.    Laboratory examination:   Recent Labs    06/30/20 1454 07/18/20 1247  NA 140 140  K 4.6 4.3  CL 103 107  CO2 21  --   GLUCOSE 92 95  BUN 26 31*  CREATININE 1.24* 1.00  CALCIUM 9.5  --   GFRNONAA 40*  --   GFRAA 46*  --    CrCl cannot be calculated (Patient's most recent lab result is older than the maximum 21 days allowed.).  CMP Latest Ref Rng & Units 07/18/2020 06/30/2020 07/27/2016  Glucose  70 - 99 mg/dL 95 92 87  BUN 8 - 23 mg/dL 31(H) 26 18  Creatinine 0.44 - 1.00 mg/dL 1.00 1.24(H) 0.95  Sodium 135 - 145 mmol/L 140 140 139  Potassium 3.5 - 5.1 mmol/L 4.3 4.6 3.9  Chloride 98 - 111 mmol/L 107 103 103  CO2 20 - 29 mmol/L - 21 28  Calcium 8.7 - 10.3 mg/dL - 9.5 9.6   CBC Latest Ref Rng & Units 07/18/2020 03/17/2019 07/27/2016  WBC 3.8 - 10.8 Thousand/uL - 9.8 6.9  Hemoglobin 12.0 - 15.0 g/dL 11.6(L) 11.0(L) 11.9(L)  Hematocrit 36.0 - 46.0 % 34.0(L) 33.3(L) 36.2  Platelets 140 - 400 Thousand/uL - 175 182   Lipid Panel  No results found for: CHOL, TRIG, HDL, CHOLHDL, VLDL, LDLCALC, LDLDIRECT HEMOGLOBIN A1C No results found for: HGBA1C, MPG TSH No results for input(s): TSH in the last 8760 hours.  External labs:    Cholesterol, total 158.000 m 06/08/2019 HDL 76 MG/DL 06/08/2019 LDL 73.000 mg 06/08/2019 Triglycerides 45.000 06/08/2019  A1C 5.100 % 06/09/2019 TSH 1.960 06/08/2019  Hemoglobin 11.400 g/d 06/08/2019  Creatinine, Serum 1.000 mg/ 06/08/2019 ALT (SGPT) 14.000 uni 06/08/2019  Allergies  No Known Allergies    Medications Prior to Visit:   Outpatient Medications Prior to Visit  Medication Sig Dispense Refill  . apixaban (ELIQUIS) 5 MG TABS tablet Take 5 mg by mouth 2 (two) times daily after a meal.    . atorvastatin (LIPITOR) 40 MG tablet TAKE 1 TABLET DAILY (Patient taking differently: Take 40 mg by mouth at bedtime.) 90 tablet 3  . Cholecalciferol (VITAMIN D) 2000 units tablet Take 2,000 Units by mouth 2 (two) times daily after a meal.    . dronedarone (MULTAQ) 400 MG tablet Take 1 tablet (400 mg total) by mouth 2 (two) times daily with a meal. (Patient taking differently: Take 400 mg by mouth 2 (two) times daily after a meal.) 180 tablet 3  . Glycerin-Hypromellose-PEG 400 (DRY EYE RELIEF DROPS) 0.2-0.2-1 % SOLN Place 2 drops into both eyes in the morning and at bedtime.    Marland Kitchen losartan (COZAAR) 50 MG tablet Take 50 mg by mouth at bedtime.    . Multiple  Vitamins-Minerals (PRESERVISION AREDS PO) Take 1 capsule by mouth 2 (two) times daily after a meal.    . Probiotic Product (ALIGN) 4 MG CAPS Take 4 mg by mouth daily as needed (bloating).    . TOPROL XL 50 MG 24 hr tablet Take 50 mg by mouth daily after breakfast.    . Wheat Dextrin (BENEFIBER ON THE GO) POWD Take by mouth daily with breakfast.    . zaleplon (SONATA) 5 MG capsule Take 5 mg by mouth at bedtime as needed for sleep.    Marland Kitchen amLODipine (NORVASC) 5 MG tablet TAKE 1 TABLET(5 MG) BY MOUTH DAILY (Patient taking differently: Take 5 mg by mouth daily after breakfast.) 90 tablet 1  . Calcium-Vit D-Arg-Inos-Silicon (BONE DENSITY PO) Take 1-2 tablets by mouth See admin instructions. Take 1 tablet by mouth in the morning after breakfast and take 2 tablets by mouth in the evening after supper     No facility-administered medications prior to visit.   Final Medications at End of Visit    Current Meds  Medication Sig  . apixaban (ELIQUIS) 5 MG TABS tablet Take 5 mg by mouth 2 (two) times daily after a meal.  . atorvastatin (LIPITOR) 40 MG tablet TAKE 1 TABLET DAILY (Patient taking differently: Take 40 mg by mouth at bedtime.)  . Cholecalciferol (VITAMIN D) 2000 units tablet Take 2,000 Units by mouth 2 (two) times daily after a meal.  . dronedarone (MULTAQ) 400 MG tablet Take 1 tablet (400 mg total) by mouth 2 (two) times daily with a meal. (Patient taking differently: Take 400 mg by mouth 2 (two) times daily after a meal.)  . Glycerin-Hypromellose-PEG 400 (DRY EYE RELIEF DROPS) 0.2-0.2-1 % SOLN Place 2 drops into both eyes in the morning and at bedtime.  Marland Kitchen losartan (COZAAR) 50 MG tablet Take 50 mg by mouth at bedtime.  . Multiple Vitamins-Minerals (PRESERVISION AREDS  PO) Take 1 capsule by mouth 2 (two) times daily after a meal.  . Probiotic Product (ALIGN) 4 MG CAPS Take 4 mg by mouth daily as needed (bloating).  . TOPROL XL 50 MG 24 hr tablet Take 50 mg by mouth daily after breakfast.  . Wheat  Dextrin (BENEFIBER ON THE GO) POWD Take by mouth daily with breakfast.  . zaleplon (SONATA) 5 MG capsule Take 5 mg by mouth at bedtime as needed for sleep.  . [DISCONTINUED] amLODipine (NORVASC) 5 MG tablet TAKE 1 TABLET(5 MG) BY MOUTH DAILY (Patient taking differently: Take 5 mg by mouth daily after breakfast.)     Radiology:  No results found.  Cardiac Studies:   Sleep Study 2006: Normal.  Lower extremity arterial duplex 05/24/2015:  No hemodynamically significant stenoses are identified in bilateral lower extremity arterial system. Mild plaque noted.  This exam reveals normal perfusion of both  the  lower extremities with RABI >1.14 and LABI 1.07.  Abdominal Ultrasound 02/11/2018: The maximum aorta diameter is 2.47 cm (prox) with mild ectasia. Calcific plaque noted in the abdominal aorta.  Normal flow velocities noted in the bilateral iliac arteries. Compared to 05/24/2015, Maximum aortid dilatation was 2.92. F/U studies if clinically indicated.  Successful cardioversion 09/10/2016, 07/29/2017.  Subclavian artery duplex 07/18/2016: Left subclavian artery is abnormal, monophasic, suggestive of proximal obstruction. Left vertebral artery waveform shows high resistance. Antegrade right vertebral artery flow.  Blood Pressure Right: 155/80   Left: 135/75 No hemodynamically significant arterial disease in the common carotid artery bilaterally. Compared to the study done on 05/30/2015, left subclavian artery monophasic waveform now well demonstrated.  Otherwise no significant change.  Abdominal aortic duplex 02/11/2018: The maximum aorta diameter is 2.47 cm (prox) with mild ectasia. Calcific plaque noted in the abdominal aorta.  Normal flow velocities noted in the bilateral iliac arteries. Compared to 05/24/2015, Maximum aortid dilatation was 2.92. F/U studies if clinically indicated.  Event Monitor: Episodic transmission 02/20/2017 reveals 4 beat run of NSVT at 12:20 a.m. Unscheduled  transmission 03/01/17: 4.4 second ventricular standstill at 5am. Rhythm converted from atypical A. Flutter to NSR. No change in therapy for now, continue to observe  Lexiscan myoview stress test: 03/03/2017:                                                                        1. The exercise tolerance was not assessed due to pharmacologic stress. Stress EKG Test Results Normal.     2. Normal SPECT study with normal left ventricular systolic function Low risk study. Normal left ventricular systolic function. No scar or ischemia detected by nuclear imaging.  Carotid artery duplex 05/09/2020:  Minimal stenosis in the right internal carotid artery (1-15%) with heterogeneous plaque. Stenosis in the right external carotid artery (<50%).  Minimal stenosis in the left internal carotid artery (1-15%) with heterogeneous plaque. Stenosis in the left external carotid artery (<50%).  Antegrade right vertebral artery flow. Antegrade left vertebral artery flow.  Follow up studies is appropriate when clinically indicated. Compared to 05/24/2019, right ICA stenosis of 15-49% not present.   Echocardiogram 06/16/2020: Mildly depressed LV systolic function with visual EF 45-50%. Left ventricle cavity is normal in size. Mild hypokinetic global wall motion. Doppler evidence of grade  III (restrictive) diastolic dysfunction, elevated LAP. Left atrial cavity is mildly dilated. Right atrial cavity is mildly dilated. Moderate (Grade II) aortic regurgitation. Mild to moderate mitral regurgitation. Mild tricuspid regurgitation. Moderate pulmonary hypertension. RVSP measures 51 mmHg. Mild pulmonic regurgitation. Insignificant pericardial effusion. IVC is dilated with blunted respiratory response. Compared to prior study dated 03/25/2017: LVEF was 55% now 45-50%, G2DD is now G3DD, otherwise no significant change.     EKG  10/09/2020: Normal sinus rhythm at a rate of 74 bpm.  Normal axis.  Poor R wave progression, cannot  exclude anteroseptal infarct old.  Low voltage complexes.  EKG 07/24/2020: Normal sinus rhythm at rate of 67 bpm, normal axis.  Incomplete right bundle branch block.  Poor R wave progression, probably normal variant but cannot exclude anteroseptal infarct old. Low voltage.  No evidence of ischemia.  Baseline artifact.    EKG 06/30/2020: Atrial fibrillation with controlled ventricular response at rate of 69 bpm, normal axis. Nonspecific T abnormality.   EKG 05/26/2020: Atrial fibrillation with controlled ventricular response at rate of 82 bpm, normal axis, poor R wave progression, probably normal variant.  Nonspecific ST-T abnormality.  Single PVC.  Low voltage -possible pulmonary disease.  Compared to 05/31/2019, normal sinus rhythm and normal EKG.   Assessment     ICD-10-CM   1. Paroxysmal atrial fibrillation (HCC)  I48.0 EKG 12-Lead  2. Essential hypertension  I10     CHA2DS2-VASc Score is 5.  Yearly risk of stroke: 6.7% (A, F, HTN, Vasc Dz).  Score of 1=0.6; 2=2.2; 3=3.2; 4=4.8; 5=7.2; 6=9.8; 7=>9.8) -(CHF; HTN; vasc disease DM,  Female = 1; Age <65 =0; 65-74 = 1,  >75 =2; stroke/embolism= 2).    No orders of the defined types were placed in this encounter.  Medications Discontinued During This Encounter  Medication Reason  . Calcium-Vit D-Arg-Inos-Silicon (BONE DENSITY PO) Error  . amLODipine (NORVASC) 5 MG tablet Side effect (s)    Orders Placed This Encounter  Procedures  . EKG 12-Lead   Recommendations:   Kimberly Villegas  is a 86 y.o. female  with paroxysmal atrial fibrillation, s/p cardioversion on 09/2016, 07/29/17, mild to moderate aortic and mitral regurgitation and very mild asymptomatic left subclavian artery stenosis, carotid atherosclerosis, hypertension, and hyperlipidemia.  She also had nonischemic cardiomyopathy with EF 40% when she was in atrial fibrillation which improved back to normal after she maintained sinus rhythm in 2018.  Due to mildly reduced LVEF and  fatigue and dyspnea, underwent direct-current cardioversion on 07/18/2020 which was successful. She is tolerating Multaq without any side effects.  Patient presents for urgent visit with concerns of dizziness.  Patient is orthostatic in the office today and brings with her home blood pressure readings which reveal blood pressures have been soft at home over the last several weeks.  We will therefore discontinue amlodipine.  Advised patient to continue to monitor symptoms and blood pressure and notify our office if she continues to experience dizziness and her blood pressure remains soft on home monitoring.  Patient has upcoming appointment with Dr. Einar Gip, which she prefers to keep.  Patient will follow-up as previously scheduled unless otherwise necessary to be seen sooner.  Counseled patient regarding signs and symptoms that would warrant urgent/emergent evaluation, she verbalized understanding agreement.  Notably patient's husband and daughter are both present at bedside and are in agreement with the plan as well.   Alethia Berthold, PA-C 10/09/2020, 4:24 PM Office: [phone]

## 2020-10-09 ENCOUNTER — Encounter: Payer: Self-pay | Admitting: Student

## 2020-10-09 ENCOUNTER — Ambulatory Visit: Payer: Medicare HMO | Admitting: Student

## 2020-10-09 ENCOUNTER — Other Ambulatory Visit: Payer: Self-pay

## 2020-10-09 VITALS — BP 128/55 | HR 72 | Temp 98.0°F | Resp 16 | Ht 65.0 in | Wt 139.0 lb

## 2020-10-09 DIAGNOSIS — I480 Paroxysmal atrial fibrillation: Secondary | ICD-10-CM

## 2020-10-09 DIAGNOSIS — I10 Essential (primary) hypertension: Secondary | ICD-10-CM

## 2020-10-20 DIAGNOSIS — M542 Cervicalgia: Secondary | ICD-10-CM | POA: Insufficient documentation

## 2020-11-06 ENCOUNTER — Other Ambulatory Visit: Payer: Self-pay | Admitting: Cardiology

## 2020-11-07 ENCOUNTER — Ambulatory Visit: Payer: Medicare HMO | Admitting: Podiatry

## 2020-11-07 ENCOUNTER — Other Ambulatory Visit: Payer: Self-pay

## 2020-11-07 ENCOUNTER — Encounter: Payer: Self-pay | Admitting: Podiatry

## 2020-11-07 DIAGNOSIS — B351 Tinea unguium: Secondary | ICD-10-CM | POA: Diagnosis not present

## 2020-11-07 DIAGNOSIS — Z89429 Acquired absence of other toe(s), unspecified side: Secondary | ICD-10-CM

## 2020-11-07 DIAGNOSIS — M79675 Pain in left toe(s): Secondary | ICD-10-CM | POA: Diagnosis not present

## 2020-11-07 DIAGNOSIS — M79674 Pain in right toe(s): Secondary | ICD-10-CM | POA: Diagnosis not present

## 2020-11-10 ENCOUNTER — Encounter: Payer: Self-pay | Admitting: Podiatry

## 2020-11-10 NOTE — Progress Notes (Signed)
Subjective: Kimberly Villegas is a pleasant 86 y.o. female patient seen today for at-risk foot care with painful thick toenails that are difficult to trim. Pain interferes with ambulation. Aggravating factors include wearing enclosed shoe gear. Pain is relieved with periodic professional debridement.  No Known Allergies  Objective: Physical Exam  General: Kimberly Villegas is a pleasant 86 y.o. Caucasian female, WD, WN in NAD. AAO x 3.   Vascular:  Capillary refill time to digits immediate b/l. Palpable DP pulse(s) b/l lower extremities Palpable PT pulse(s) b/l lower extremities Pedal hair sparse. Lower extremity skin temperature gradient within normal limits. No edema noted b/l lower extremities.  Dermatological:  Pedal skin with normal turgor, texture and tone b/l lower extremities No open wounds b/l lower extremities Toenails L hallux, L 3rd toe, L 4th toe, L 5th toe, R hallux, R 3rd toe, R 4th toe, and R 5th toe elongated, discolored, dystrophic, thickened, and crumbly with subungual debris and tenderness to dorsal palpation. No hyperkeratotic nor porokeratotic lesions present on today's visit.  Musculoskeletal:  Normal muscle strength 5/5 to all lower extremity muscle groups bilaterally. No pain crepitus or joint limitation noted with ROM b/l. Lower extremity amputation(s): digital amputation L 2nd toe and R 2nd toe.  Neurological:  Protective sensation intact 5/5 intact bilaterally with 10g monofilament b/l. Vibratory sensation intact b/l.  Assessment and Plan:  1. Pain due to onychomycosis of toenails of both feet   2. Status post amputation of lesser toe, unspecified laterality ([address])      -Examined patient. -Patient to continue soft, supportive shoe gear daily. -Toenails L hallux, L 3rd toe, L 4th toe, L 5th toe, R hallux, R 3rd toe, R 4th toe, and R 5th toe debrided in length and girth without iatrogenic bleeding with sterile nail nipper and dremel.  -Patient to report any  pedal injuries to medical professional immediately. -Patient/POA to call should there be question/concern in the interim.  Return in about 9 weeks (around 01/09/2021).  Marzetta Board, DPM

## 2021-01-15 ENCOUNTER — Ambulatory Visit: Payer: Medicare HMO | Admitting: Podiatry

## 2021-01-29 ENCOUNTER — Other Ambulatory Visit: Payer: Medicare HMO

## 2021-01-29 DIAGNOSIS — M533 Sacrococcygeal disorders, not elsewhere classified: Secondary | ICD-10-CM | POA: Insufficient documentation

## 2021-01-29 DIAGNOSIS — M545 Low back pain, unspecified: Secondary | ICD-10-CM | POA: Insufficient documentation

## 2021-02-01 ENCOUNTER — Other Ambulatory Visit: Payer: Self-pay

## 2021-02-01 ENCOUNTER — Ambulatory Visit: Payer: Medicare HMO

## 2021-02-01 DIAGNOSIS — I480 Paroxysmal atrial fibrillation: Secondary | ICD-10-CM

## 2021-02-01 DIAGNOSIS — I428 Other cardiomyopathies: Secondary | ICD-10-CM

## 2021-02-09 ENCOUNTER — Other Ambulatory Visit: Payer: Self-pay

## 2021-02-09 ENCOUNTER — Ambulatory Visit: Payer: Medicare HMO | Admitting: Cardiology

## 2021-02-09 ENCOUNTER — Encounter: Payer: Self-pay | Admitting: Cardiology

## 2021-02-09 VITALS — BP 102/44 | HR 72 | Temp 97.8°F | Resp 16 | Ht 65.0 in | Wt 145.2 lb

## 2021-02-09 DIAGNOSIS — R0609 Other forms of dyspnea: Secondary | ICD-10-CM

## 2021-02-09 DIAGNOSIS — I480 Paroxysmal atrial fibrillation: Secondary | ICD-10-CM

## 2021-02-09 DIAGNOSIS — I10 Essential (primary) hypertension: Secondary | ICD-10-CM

## 2021-02-09 MED ORDER — LOSARTAN POTASSIUM 25 MG PO TABS: 25.0000 mg | ORAL_TABLET | Freq: Every day | ORAL | 3 refills | Status: DC

## 2021-02-09 NOTE — Progress Notes (Signed)
Primary Physician/Referring:  Prince Solian, MD  Patient ID: Kimberly Villegas, female    DOB: [birth year], 86 y.o.   MRN: [id number]  Chief Complaint  Patient presents with   Paroxysmal atrial fibrillation   Follow-up    6 month   Hypertension   HPI:    Kimberly Villegas  is a 86 y.o.  female with paroxysmal atrial fibrillation, s/p cardioversion on 09/2016, 07/29/17, mild to moderate aortic and mitral regurgitation and very mild  asymptomatic left subclavian artery stenosis, carotid atherosclerosis, hypertension, and hyperlipidemia.  She also had nonischemic cardiomyopathy with EF 40% when she was in atrial fibrillation which improved back to normal after she maintained sinus rhythm in 2018.  Due to mildly reduced LVEF and fatigue and dyspnea, underwent direct-current cardioversion on 07/18/2020 which was successful. She is tolerating Multaq without any side effects.  Patient now presents for 82-month follow-up.  Dizziness has resolved with discontinuation of amlodipine.  However patient notes that her blood pressure remains soft on home monitoring and she does have mild dyspnea on exertion.  She states she is not short of breath during daily activities, however recently was [place] to the airport and felt short of breath.  She recovers quickly with rest.  Denies chest pain, palpitations, dizziness, orthopnea, PND, leg swelling.  Past Medical History:  Diagnosis Date   A-fib (HCC)    Anemia, chronic disease    Arthritis    Asthma    Bilateral carotid bruits    CAD (coronary artery disease)    Cervical stenosis of spine    Cough    Diastolic dysfunction    DVT (deep venous thrombosis) (HCC)    after vein stripping years ago   GERD (gastroesophageal reflux disease)    Hyperlipemia    Hypertension    Insomnia    New onset a-fib ([place]) 2018   Seasonal allergies    Syncope    Vitamin D deficiency    Past Surgical History:  Procedure Laterality Date   AMPUTATION  TOE Bilateral 07/25/2016   Procedure: bilateral 2nd toe amputations;  Surgeon: Wylene Simmer, MD;  Location: [facility];  Service: Orthopedics;  Laterality: Bilateral;   BASAL CELL CARCINOMA EXCISION     CARDIOVERSION N/A 09/10/2016   Procedure: CARDIOVERSION;  Surgeon: Adrian Prows, MD;  Location: [facility] ENDOSCOPY;  Service: Cardiovascular;  Laterality: N/A;   CARDIOVERSION N/A 07/29/2017   Procedure: CARDIOVERSION;  Surgeon: Nigel Mormon, MD;  Location: [facility] ENDOSCOPY;  Service: Cardiovascular;  Laterality: N/A;   CARDIOVERSION N/A 07/18/2020   Procedure: CARDIOVERSION;  Surgeon: Adrian Prows, MD;  Location: [facility] ENDOSCOPY;  Service: Cardiovascular;  Laterality: N/A;   FOOT SURGERY Left    JOINT REPLACEMENT Right    VEIN LIGATION AND STRIPPING Right    Family History  Problem Relation Age of Onset   Heart disease Mother    Stroke Mother    Colon cancer Father    Heart disease Brother    Healthy Child    Healthy Child    Healthy Child    Healthy Grandchild    Healthy Grandchild    Healthy Grandchild    Healthy Grandchild    Healthy Grandchild    Social History   Tobacco Use   Smoking status: Former    Packs/day: 0.25    Years: 5.00    Pack years: 1.25    Types: Cigarettes    Quit date: 1970    Years since quitting: 52.8   Smokeless  tobacco: Never   Tobacco comments:    social  Substance Use Topics   Alcohol use: Not Currently    Alcohol/week: 1.0 standard drink    Types: 1 Glasses of wine per week    Comment: Occassionally  Marital Status: Married   ROS  Review of Systems  Constitutional: Negative for malaise/fatigue and weight gain.  Cardiovascular:  Positive for dyspnea on exertion. Negative for chest pain, claudication, leg swelling, near-syncope, orthopnea, palpitations, paroxysmal nocturnal dyspnea and syncope.  Respiratory:  Negative for shortness of breath.   Hematologic/Lymphatic: Does not bruise/bleed easily.  Gastrointestinal:  Negative for melena.   Neurological:  Negative for dizziness.  Psychiatric/Behavioral:  The patient has insomnia.   Objective  Blood pressure (!) 102/44, pulse 72, temperature 97.8 F (36.6 C), temperature source Temporal, resp. rate 16, height 5\' 5"  (1.651 m), weight 145 lb 3.2 oz (65.9 kg), SpO2 97 %.  Vitals with BMI 02/09/2021 10/09/2020 07/24/2020  Height 5\' 5"  5\' 5"  -  Weight 145 lbs 3 oz 139 lbs -  BMI 63.33 54.56 -  Systolic 256 389 92  Diastolic 44 55 36  Pulse 72 72 70     Physical Exam Vitals reviewed.  Constitutional:      General: She is not in acute distress. Neck:     Thyroid: No thyromegaly.  Cardiovascular:     Rate and Rhythm: Normal rate and regular rhythm.     Pulses: Normal pulses and intact distal pulses.          Carotid pulses are  on the right side with bruit and  on the left side with bruit.    Heart sounds: Murmur heard.  Blowing midsystolic murmur is present with a grade of 2/6 at the lower left sternal border.    No gallop.     Comments: No JVD. Pulmonary:     Effort: Pulmonary effort is normal. No respiratory distress.     Breath sounds: Normal breath sounds. No wheezing or rales.  Musculoskeletal:     Right lower leg: No edema.     Left lower leg: No edema.  Skin:    General: Skin is warm and dry.  Neurological:     Mental Status: She is alert.  Physical exam unchanged compared to previous.  Laboratory examination:   Recent Labs    06/30/20 1454 07/18/20 1247  NA 140 140  K 4.6 4.3  CL 103 107  CO2 21  --   GLUCOSE 92 95  BUN 26 31*  CREATININE 1.24* 1.00  CALCIUM 9.5  --   GFRNONAA 40*  --   GFRAA 46*  --    CrCl cannot be calculated (Patient's most recent lab result is older than the maximum 21 days allowed.).  CMP Latest Ref Rng & Units 07/18/2020 06/30/2020 07/27/2016  Glucose 70 - 99 mg/dL 95 92 87  BUN 8 - 23 mg/dL 31(H) 26 18  Creatinine 0.44 - 1.00 mg/dL 1.00 1.24(H) 0.95  Sodium 135 - 145 mmol/L 140 140 139  Potassium 3.5 - 5.1 mmol/L 4.3 4.6  3.9  Chloride 98 - 111 mmol/L 107 103 103  CO2 20 - 29 mmol/L - 21 28  Calcium 8.7 - 10.3 mg/dL - 9.5 9.6   CBC Latest Ref Rng & Units 07/18/2020 03/17/2019 07/27/2016  WBC 3.8 - 10.8 Thousand/uL - 9.8 6.9  Hemoglobin 12.0 - 15.0 g/dL 11.6(L) 11.0(L) 11.9(L)  Hematocrit 36.0 - 46.0 % 34.0(L) 33.3(L) 36.2  Platelets 140 - 400 Thousand/uL -  175 182   Lipid Panel  No results found for: CHOL, TRIG, HDL, CHOLHDL, VLDL, LDLCALC, LDLDIRECT HEMOGLOBIN A1C No results found for: HGBA1C, MPG TSH No results for input(s): TSH in the last 8760 hours.  External labs:    Cholesterol, total 158.000 m 06/08/2019 HDL 76 MG/DL 06/08/2019 LDL 73.000 mg 06/08/2019 Triglycerides 45.000 06/08/2019  A1C 5.100 % 06/09/2019 TSH 1.960 06/08/2019  Hemoglobin 11.400 g/d 06/08/2019  Creatinine, Serum 1.000 mg/ 06/08/2019 ALT (SGPT) 14.000 uni 06/08/2019  Allergies  No Known Allergies   Medications Prior to Visit:   Outpatient Medications Prior to Visit  Medication Sig Dispense Refill   apixaban (ELIQUIS) 5 MG TABS tablet Take 5 mg by mouth 2 (two) times daily after a meal.     atorvastatin (LIPITOR) 40 MG tablet TAKE 1 TABLET DAILY 90 tablet 3   Cholecalciferol (VITAMIN D) 2000 units tablet Take 2,000 Units by mouth 2 (two) times daily after a meal.     dronedarone (MULTAQ) 400 MG tablet Take 1 tablet (400 mg total) by mouth 2 (two) times daily with a meal. (Patient taking differently: Take 400 mg by mouth 2 (two) times daily after a meal.) 180 tablet 3   fluticasone (FLONASE) 50 MCG/ACT nasal spray fluticasone propionate 50 mcg/actuation nasal spray,suspension  SHAKE LIQUID AND USE 1 SPRAY IN EACH NOSTRIL EVERY DAY     Glycerin-Hypromellose-PEG 400 (DRY EYE RELIEF DROPS) 0.2-0.2-1 % SOLN Place 2 drops into both eyes in the morning and at bedtime.     metoprolol tartrate (LOPRESSOR) 50 MG tablet Take 50 mg by mouth daily.     Multiple Vitamins-Minerals (PRESERVISION AREDS PO) Take 1 capsule by mouth 2 (two) times  daily after a meal.     Probiotic Product (ALIGN) 4 MG CAPS Take 4 mg by mouth daily as needed (bloating).     Turmeric (QC TUMERIC COMPLEX) 500 MG CAPS Take 1 capsule by mouth daily.     Wheat Dextrin (BENEFIBER ON THE GO) POWD Take by mouth daily with breakfast.     zaleplon (SONATA) 5 MG capsule Take 5 mg by mouth at bedtime as needed for sleep.     losartan (COZAAR) 50 MG tablet Take 25 mg by mouth at bedtime.     albuterol (VENTOLIN HFA) 108 (90 Base) MCG/ACT inhaler albuterol sulfate HFA 90 mcg/actuation aerosol inhaler     amLODipine (NORVASC) 5 MG tablet amlodipine 5 mg tablet  TAKE 1 TABLET BY MOUTH EVERY DAY     cefdinir (OMNICEF) 300 MG capsule cefdinir 300 mg capsule  TAKE 1 CAPSULE BY MOUTH TWICE DAILY FOR 10 DAYS     cephALEXin (KEFLEX) 500 MG capsule cephalexin 500 mg capsule  TAKE 4 CAPSULES BY MOUTH 1 HOUR PRIOR TO DENTAL APPT     doxycycline (VIBRA-TABS) 100 MG tablet doxycycline hyclate 100 mg tablet  TAKE 2 TABLETS BY MOUTH EVERY DAY FOR 7 DAYS     TOPROL XL 50 MG 24 hr tablet Take 50 mg by mouth daily after breakfast.     No facility-administered medications prior to visit.   Final Medications at End of Visit    Current Meds  Medication Sig   apixaban (ELIQUIS) 5 MG TABS tablet Take 5 mg by mouth 2 (two) times daily after a meal.   atorvastatin (LIPITOR) 40 MG tablet TAKE 1 TABLET DAILY   Cholecalciferol (VITAMIN D) 2000 units tablet Take 2,000 Units by mouth 2 (two) times daily after a meal.   dronedarone (MULTAQ) 400 MG tablet  Take 1 tablet (400 mg total) by mouth 2 (two) times daily with a meal. (Patient taking differently: Take 400 mg by mouth 2 (two) times daily after a meal.)   fluticasone (FLONASE) 50 MCG/ACT nasal spray fluticasone propionate 50 mcg/actuation nasal spray,suspension  SHAKE LIQUID AND USE 1 SPRAY IN EACH NOSTRIL EVERY DAY   Glycerin-Hypromellose-PEG 400 (DRY EYE RELIEF DROPS) 0.2-0.2-1 % SOLN Place 2 drops into both eyes in the morning and at  bedtime.   metoprolol tartrate (LOPRESSOR) 50 MG tablet Take 50 mg by mouth daily.   Multiple Vitamins-Minerals (PRESERVISION AREDS PO) Take 1 capsule by mouth 2 (two) times daily after a meal.   Probiotic Product (ALIGN) 4 MG CAPS Take 4 mg by mouth daily as needed (bloating).   Turmeric (QC TUMERIC COMPLEX) 500 MG CAPS Take 1 capsule by mouth daily.   Wheat Dextrin (BENEFIBER ON THE GO) POWD Take by mouth daily with breakfast.   zaleplon (SONATA) 5 MG capsule Take 5 mg by mouth at bedtime as needed for sleep.   [DISCONTINUED] losartan (COZAAR) 50 MG tablet Take 25 mg by mouth at bedtime.     Radiology:  No results found.  Cardiac Studies:   Sleep Study 2006: Normal.  Lower extremity arterial duplex 05/24/2015:  No hemodynamically significant stenoses are identified in bilateral lower extremity arterial system. Mild plaque noted.  This exam reveals normal perfusion of both  the  lower extremities with RABI >1.14 and LABI 1.07.  Abdominal Ultrasound 02/11/2018: The maximum aorta diameter is 2.47 cm (prox) with mild ectasia. Calcific plaque noted in the abdominal aorta.  Normal flow velocities noted in the bilateral iliac arteries. Compared to 05/24/2015, Maximum aortid dilatation was 2.92. F/U studies if clinically indicated.  Successful cardioversion 09/10/2016, 07/29/2017.  Subclavian artery duplex 07/18/2016: Left subclavian artery is abnormal, monophasic, suggestive of proximal obstruction. Left vertebral artery waveform shows high resistance. Antegrade right vertebral artery flow.  Blood Pressure Right: 155/80   Left: 135/75 No hemodynamically significant arterial disease in the common carotid artery bilaterally. Compared to the study done on 05/30/2015, left subclavian artery monophasic waveform now well demonstrated.  Otherwise no significant change.  Abdominal aortic duplex 02/11/2018: The maximum aorta diameter is 2.47 cm (prox) with mild ectasia. Calcific plaque noted in  the abdominal aorta.  Normal flow velocities noted in the bilateral iliac arteries. Compared to 05/24/2015, Maximum aortid dilatation was 2.92. F/U studies if clinically indicated.  Event Monitor: Episodic transmission 02/20/2017 reveals 4 beat run of NSVT at 12:20 a.m. Unscheduled transmission 03/01/17: 4.4 second ventricular standstill at 5am. Rhythm converted from atypical A. Flutter to NSR. No change in therapy for now, continue to observe  Lexiscan myoview stress test: 03/03/2017:                                                                        1. The exercise tolerance was not assessed due to pharmacologic stress. Stress EKG Test Results Normal.     2. Normal SPECT study with normal left ventricular systolic function Low risk study. Normal left ventricular systolic function. No scar or ischemia detected by nuclear imaging.  Carotid artery duplex 05/09/2020:  Minimal stenosis in the right internal carotid artery (1-15%) with heterogeneous plaque. Stenosis in  the right external carotid artery (<50%).  Minimal stenosis in the left internal carotid artery (1-15%) with heterogeneous plaque. Stenosis in the left external carotid artery (<50%).  Antegrade right vertebral artery flow. Antegrade left vertebral artery flow.  Follow up studies is appropriate when clinically indicated. Compared to 05/24/2019, right ICA stenosis of 15-49% not present.   Echocardiogram 02/01/2021:  Left ventricle cavity is normal in size and wall thickness. Normal global  wall motion. Normal LV systolic function with EF 65%. Diastolic function  not assessed due to severity of mitral regurgitation.  Left atrial cavity is moderately dilated.  Right atrial cavity is moderately dilated.  Trileaflet aortic valve.  Moderate (Grade III) aortic regurgitation.  Moderate (Grade III) mitral regurgitation.  Mild tricuspid regurgitation. Estimated pulmonary artery systolic pressure  37 mmHg.  Personally compared with  previous study on 06/17/2019. No significant  change noted.     EKG  02/09/2021: Sinus rhythm at a rate of 72 bpm.  Normal axis.  No evidence of ischemia or underlying injury pattern.  Low voltage complexes.  Compared to EKG 10/09/2020, no poor R wave progression.  EKG 07/24/2020: Normal sinus rhythm at rate of 67 bpm, normal axis.  Incomplete right bundle branch block.  Poor R wave progression, probably normal variant but cannot exclude anteroseptal infarct old. Low voltage.  No evidence of ischemia.  Baseline artifact.    EKG 06/30/2020: Atrial fibrillation with controlled ventricular response at rate of 69 bpm, normal axis. Nonspecific T abnormality.   EKG 05/26/2020: Atrial fibrillation with controlled ventricular response at rate of 82 bpm, normal axis, poor R wave progression, probably normal variant.  Nonspecific ST-T abnormality.  Single PVC.  Low voltage -possible pulmonary disease.  Compared to 05/31/2019, normal sinus rhythm and normal EKG.   Assessment     ICD-10-CM   1. Dyspnea on exertion  R06.09     2. Paroxysmal atrial fibrillation (HCC)  I48.0 EKG 12-Lead    3. Essential hypertension  I10       CHA2DS2-VASc Score is 5.  Yearly risk of stroke: 6.7% (A, F, HTN, Vasc Dz).  Score of 1=0.6; 2=2.2; 3=3.2; 4=4.8; 5=7.2; 6=9.8; 7=>9.8) -(CHF; HTN; vasc disease DM,  Female = 1; Age <65 =0; 65-74 = 1,  >75 =2; stroke/embolism= 2).    Meds ordered this encounter  Medications   losartan (COZAAR) 25 MG tablet    Sig: Take 1 tablet (25 mg total) by mouth at bedtime.    Dispense:  90 tablet    Refill:  3   Medications Discontinued During This Encounter  Medication Reason   amLODipine (NORVASC) 5 MG tablet Error   cefdinir (OMNICEF) 300 MG capsule Error   cephALEXin (KEFLEX) 500 MG capsule Error   doxycycline (VIBRA-TABS) 100 MG tablet Error   TOPROL XL 50 MG 24 hr tablet Error   albuterol (VENTOLIN HFA) 108 (90 Base) MCG/ACT inhaler Patient Preference   losartan (COZAAR) 50 MG  tablet Reorder    Orders Placed This Encounter  Procedures   EKG 12-Lead   Recommendations:   LINZEY RAMSER  is a 86 y.o. female  with paroxysmal atrial fibrillation, s/p cardioversion on 09/2016, 07/29/17, mild to moderate aortic and mitral regurgitation and very mild  asymptomatic left subclavian artery stenosis, carotid atherosclerosis, hypertension, and hyperlipidemia.  She also had nonischemic cardiomyopathy with EF 40% when she was in atrial fibrillation which improved back to normal after she maintained sinus rhythm in 2018.  Due to mildly reduced LVEF and fatigue  and dyspnea, underwent direct-current cardioversion on 07/18/2020 which was successful. She is tolerating Multaq without any side effects.  Reviewed and discussed with patient results of echocardiogram, details above.  Do not suspect dyspnea to be related to echo as it has remained stable compared to previous.  Given that her blood pressure remains soft we will reduce losartan from 50 mg to 25 mg daily.  Patient will notify our office if on exertion worsens or fails to improve or if her blood pressure becomes uncontrolled.  She is otherwise stable from a cardiovascular standpoint.  Follow-up in 6 months, sooner if needed, for paroxysmal atrial fibrillation, hypertension, hyperlipidemia.    Alethia Berthold, PA-C 02/09/2021, 3:09 PM Office: [phone]

## 2021-03-19 ENCOUNTER — Ambulatory Visit: Payer: Medicare HMO | Admitting: Podiatry

## 2021-03-19 ENCOUNTER — Other Ambulatory Visit: Payer: Self-pay

## 2021-03-19 DIAGNOSIS — Z89429 Acquired absence of other toe(s), unspecified side: Secondary | ICD-10-CM | POA: Diagnosis not present

## 2021-03-19 DIAGNOSIS — M79675 Pain in left toe(s): Secondary | ICD-10-CM

## 2021-03-19 DIAGNOSIS — B351 Tinea unguium: Secondary | ICD-10-CM | POA: Diagnosis not present

## 2021-03-19 DIAGNOSIS — Q828 Other specified congenital malformations of skin: Secondary | ICD-10-CM

## 2021-03-19 DIAGNOSIS — M79674 Pain in right toe(s): Secondary | ICD-10-CM

## 2021-03-23 ENCOUNTER — Encounter: Payer: Self-pay | Admitting: Podiatry

## 2021-03-23 NOTE — Progress Notes (Signed)
  Subjective:  Patient ID: Kimberly Villegas, female    DOB: 1934/06/24,  MRN: [id number]  [name] presents to clinic today for at risk foot care. Patient has h/o amputation of digital amputation L 2nd toe and R 2nd toe and painful porokeratotic lesion(s) left foot and painful mycotic toenails that limit ambulation. Painful toenails interfere with ambulation. Aggravating factors include wearing enclosed shoe gear. Pain is relieved with periodic professional debridement. Painful porokeratotic lesions are aggravated when weightbearing with and without shoegear. Pain is relieved with periodic professional debridement.  Patient voices no new pedal problems on today's visit.  PCP is Avva, Ravisankar, MD , and last visit was 02/27/2021.  No Known Allergies  Review of Systems: Negative except as noted in the HPI. Objective:   Constitutional [name] is a pleasant 86 y.o. Caucasian female, WD, WN in NAD. AAO x 3.   Vascular CFT immediate b/l LE. Palpable DP/PT pulses b/l LE. Digital hair sparse b/l. Skin temperature gradient WNL b/l. No pain with calf compression b/l. No edema noted b/l. No cyanosis or clubbing noted b/l LE. No cyanosis or clubbing noted.  Neurologic Normal speech. Oriented to person, place, and time. Protective sensation intact 5/5 intact bilaterally with 10g monofilament b/l. Vibratory sensation intact b/l. Proprioception intact bilaterally.  Dermatologic Pedal skin is warm and supple b/l LE. No open wounds b/l LE. No interdigital macerations noted b/l LE. Toenails L hallux, L 3rd toe, L 4th toe, L 5th toe, R hallux, R 3rd toe, R 4th toe, and R 5th toe well maintained with adequate length. No erythema, no edema, no drainage, no fluctuance. Porokeratotic lesion(s) submet head 2 left foot. No erythema, no edema, no drainage, no fluctuance.  Orthopedic: Normal muscle strength 5/5 to all lower extremity muscle groups bilaterally. HAV with bunion deformity noted b/l LE.  Lower extremity amputation(s): digital amputation L 2nd toe and R 2nd toe.   Radiographs: None Assessment:   1. Pain due to onychomycosis of toenails of both feet   2. Porokeratosis   3. Status post amputation of lesser toe, unspecified laterality ([place])    Plan:  Patient was evaluated and treated and all questions answered. Consent given for treatment as described below: -She has toe spacer and was instructed to wear it for left 1st webspace daily to prevent left hallux from drifting laterally. -Toenails L hallux, L 3rd toe, L 4th toe, L 5th toe, R hallux, R 3rd toe, R 4th toe, and R 5th toe debrided in length and girth without iatrogenic bleeding with sterile nail nipper and dremel.  -Painful porokeratotic lesion(s) submet head 2 left foot pared and enucleated with sterile scalpel blade without incident. Total number of lesions debrided=1. -Patient/POA to call should there be question/concern in the interim.  Return in about 3 months (around 06/19/2021).  Marzetta Board, DPM

## 2021-03-28 ENCOUNTER — Other Ambulatory Visit: Payer: Self-pay | Admitting: Internal Medicine

## 2021-03-28 DIAGNOSIS — N1831 Chronic kidney disease, stage 3a: Secondary | ICD-10-CM

## 2021-04-20 ENCOUNTER — Ambulatory Visit
Admission: RE | Admit: 2021-04-20 | Discharge: 2021-04-20 | Disposition: A | Discharge status: Home / Self Care | Payer: Medicare HMO | Source: Ambulatory Visit | Attending: Internal Medicine | Admitting: Internal Medicine

## 2021-04-20 DIAGNOSIS — N1831 Chronic kidney disease, stage 3a: Secondary | ICD-10-CM

## 2021-05-25 ENCOUNTER — Other Ambulatory Visit: Payer: Self-pay

## 2021-05-25 ENCOUNTER — Ambulatory Visit: Payer: Medicare HMO | Admitting: Podiatry

## 2021-05-25 DIAGNOSIS — Z89429 Acquired absence of other toe(s), unspecified side: Secondary | ICD-10-CM | POA: Diagnosis not present

## 2021-05-25 DIAGNOSIS — B351 Tinea unguium: Secondary | ICD-10-CM | POA: Diagnosis not present

## 2021-05-25 DIAGNOSIS — M79674 Pain in right toe(s): Secondary | ICD-10-CM | POA: Diagnosis not present

## 2021-05-25 DIAGNOSIS — U071 COVID-19: Secondary | ICD-10-CM | POA: Insufficient documentation

## 2021-05-25 DIAGNOSIS — J069 Acute upper respiratory infection, unspecified: Secondary | ICD-10-CM | POA: Insufficient documentation

## 2021-05-25 DIAGNOSIS — M79675 Pain in left toe(s): Secondary | ICD-10-CM

## 2021-05-25 DIAGNOSIS — R051 Acute cough: Secondary | ICD-10-CM | POA: Insufficient documentation

## 2021-05-25 DIAGNOSIS — Q828 Other specified congenital malformations of skin: Secondary | ICD-10-CM | POA: Diagnosis not present

## 2021-05-25 HISTORY — DX: COVID-19: U07.1

## 2021-06-02 ENCOUNTER — Encounter: Payer: Self-pay | Admitting: Podiatry

## 2021-06-02 NOTE — Progress Notes (Signed)
Subjective: Kimberly Villegas is a 87 y.o. female patient seen today for follow up of  at risk foot care. Patient has h/o amputation of digital amputation bilateral 2nd toes and callus(es) left foot and painful thick toenails that are difficult to trim. Painful toenails interfere with ambulation. Aggravating factors include wearing enclosed shoe gear. Pain is relieved with periodic professional debridement. Painful calluses are aggravated when weightbearing with and without shoegear. Pain is relieved with periodic professional debridement..   New problem(s)/concern(s) today: None    PCP is Avva, Ravisankar, MD. Last visit was: 6 months ago.  Allergies  Allergen Reactions   Doxycycline     Other reaction(s): photosensitivity    Objective: Physical Exam  General: Patient is a pleasant 87 y.o. Caucasian female in NAD. AAO x 3.   Neurovascular Examination: Capillary refill time to digits immediate b/l. Palpable pedal pulses b/l LE. Pedal hair sparse. No pain with calf compression b/l. Lower extremity skin temperature gradient within normal limits. No edema noted b/l LE. No cyanosis or clubbing noted b/l LE.  Protective sensation intact 5/5 intact bilaterally with 10g monofilament b/l. Vibratory sensation intact b/l.  Dermatological:  Pedal integument with normal turgor, texture and tone BLE. No open wounds b/l LE. No interdigital macerations noted b/l LE. Toenails bilateral great toes, bilateral 3rd toes, bilateral 4th toes, and bilateral 5th toes elongated, discolored, dystrophic, thickened, and crumbly with subungual debris and tenderness to dorsal palpation. Porokeratotic lesion(s) submet head 1 left foot. No erythema, no edema, no drainage, no fluctuance.  Musculoskeletal:  Muscle strength 5/5 to all lower extremity muscle groups bilaterally. Lower extremity amputation(s): digital amputation bilateral 2nd toes. Hallux valgus with bunion deformity noted b/l lower  extremities.  Assessment: 1. Pain due to onychomycosis of toenails of both feet   2. Porokeratosis   3. Status post amputation of lesser toe, unspecified laterality ([place])    Plan: Patient was evaluated and treated and all questions answered. Consent given for treatment as described below: -Mycotic toenails bilateral great toes, bilateral 3rd toes, bilateral 4th toes, and bilateral 5th toes were debrided in length and girth with sterile nail nippers and dremel without iatrogenic bleeding. -Painful porokeratotic lesion(s) submet head 1 left foot pared and enucleated with sterile scalpel blade without incident. Total number of lesions debrided=1. -Patient/POA to call should there be question/concern in the interim.  Return in about 3 months (around 08/23/2021).  Marzetta Board, DPM

## 2021-06-29 ENCOUNTER — Other Ambulatory Visit: Payer: Self-pay | Admitting: Cardiology

## 2021-06-29 DIAGNOSIS — I4819 Other persistent atrial fibrillation: Secondary | ICD-10-CM

## 2021-06-29 NOTE — Telephone Encounter (Signed)
Refill request

## 2021-07-25 ENCOUNTER — Encounter: Payer: Self-pay | Admitting: Podiatry

## 2021-07-25 ENCOUNTER — Other Ambulatory Visit: Payer: Self-pay

## 2021-07-25 ENCOUNTER — Ambulatory Visit: Payer: Medicare HMO | Admitting: Podiatry

## 2021-07-25 DIAGNOSIS — Z89429 Acquired absence of other toe(s), unspecified side: Secondary | ICD-10-CM

## 2021-07-25 DIAGNOSIS — Q828 Other specified congenital malformations of skin: Secondary | ICD-10-CM

## 2021-07-25 DIAGNOSIS — B351 Tinea unguium: Secondary | ICD-10-CM

## 2021-07-25 DIAGNOSIS — M79675 Pain in left toe(s): Secondary | ICD-10-CM | POA: Diagnosis not present

## 2021-07-25 DIAGNOSIS — M79674 Pain in right toe(s): Secondary | ICD-10-CM | POA: Diagnosis not present

## 2021-08-02 NOTE — Progress Notes (Signed)
?  Subjective:  ?Patient ID: Kimberly Villegas, female    DOB: [birth year],  MRN: [id number] ? ?DANNY ZIMNY presents to clinic today for at risk foot care. 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 Painful toenails interfere with ambulation. Aggravating factors include wearing enclosed shoe gear. Pain is relieved with periodic professional debridement. Painful porokeratotic lesions are aggravated when weightbearing with and without shoegear. Pain is relieved with periodic professional debridement. ? ?New problem(s): None.  ? ?PCP is Avva, Ravisankar, MD , and last visit was October, 2022. ? ?Allergies  ?Allergen Reactions  ? Doxycycline   ?  Other reaction(s): photosensitivity  ? ? ?Review of Systems: Negative except as noted in the HPI. ? ?Objective: No changes noted in today's physical examination. ? ?General: Patient is a pleasant 87 y.o. Caucasian female in NAD. AAO x 3.  ? ?Neurovascular Examination: ?Capillary refill time to digits immediate b/l. Palpable pedal pulses b/l LE. Pedal hair sparse. No pain with calf compression b/l. Lower extremity skin temperature gradient within normal limits. No edema noted b/l LE. No cyanosis or clubbing noted b/l LE. ? ?Protective sensation intact 5/5 intact bilaterally with 10g monofilament b/l. Vibratory sensation intact b/l. ? ?Dermatological:  ?Pedal integument with normal turgor, texture and tone BLE. No open wounds b/l LE. No interdigital macerations noted b/l LE. Toenails bilateral great toes, bilateral 3rd toes, bilateral 4th toes, and bilateral 5th toes elongated, discolored, dystrophic, thickened, and crumbly with subungual debris and tenderness to dorsal palpation. Porokeratotic lesion(s) submet head 1 left foot. No erythema, no edema, no drainage, no fluctuance. ? ?Musculoskeletal:  ?Muscle strength 5/5 to all lower extremity muscle groups  bilaterally. Lower extremity amputation(s): digital amputation bilateral 2nd toes. Hallux valgus with bunion deformity noted b/l lower extremities. ? ?Assessment/Plan: ?1. Pain due to onychomycosis of toenails of both feet   ?2. Porokeratosis   ?3. Status post amputation of lesser toe, unspecified laterality ([place])   ?  ? ?-No new findings. No new orders. ?-Toenails bilateral great toes and 3-5 bilaterally debrided in length and girth without iatrogenic bleeding with sterile nail nipper and dremel.  ?-Painful porokeratotic lesion(s) submet head 1 left foot pared and enucleated with sterile scalpel blade without incident. Total number of lesions debrided=1. ?-Patient/POA to call should there be question/concern in the interim.  ? ?Return in about 3 months (around 10/25/2021). ? ?Marzetta Board, DPM  ?

## 2021-08-07 NOTE — Progress Notes (Signed)
? ?Primary Physician/Referring:  Prince Solian, MD ? ?Patient ID: Kimberly Villegas, female    DOB: October 30, 1934, 87 y.o.   MRN: [id number] ? ?Chief Complaint  ?Patient presents with  ? Follow-up  ?  6 MONTH  ? PAF  ? valve dz  ? carotid dz  ? Hypertension  ? hld  ? ?HPI:   ? ?Kimberly Villegas  is a 87 y.o.  female with paroxysmal atrial fibrillation, s/p cardioversion on 09/2016, 07/29/17, mild to moderate aortic and mitral regurgitation and very mild asymptomatic left subclavian artery stenosis, carotid atherosclerosis, hypertension, and hyperlipidemia.  She also had nonischemic cardiomyopathy with EF 40% when she was in atrial fibrillation which improved back to normal after she maintained sinus rhythm in 2018. ? ?Due to mildly reduced LVEF and fatigue and dyspnea, underwent direct-current cardioversion on 07/18/2020 which was successful. She is tolerating Multaq without any side effects.  ? ?Patient presents for 98-monthfollow-up.  At last office visit reduce losartan from 50 to 25 mg daily given soft blood pressures.  I reviewed external labs, lipids are well controlled.  Patient notes that in January she received 3 alerts from her smart watch that her heart rate was as low as 45 bpm.  Patient was asymptomatic at this time.  These episodes occurred during the day.  Denies chest pain, palpitations, orthopnea, PND, leg edema. ? ?Past Medical History:  ?Diagnosis Date  ? [place]   ? Anemia, chronic disease   ? Arthritis   ? Asthma   ? Bilateral carotid bruits   ? CAD (coronary artery disease)   ? Cervical stenosis of spine   ? Cough   ? Diastolic dysfunction   ? D[place]) ([place]   ? after vein stripping years ago  ? GERD (gastroesophageal reflux disease)   ? Hyperlipemia   ? Hypertension   ? Insomnia   ? New onset a-fib ([facility] 2018  ? Seasonal allergies   ? Syncope   ? Vitamin D deficiency   ? ?Past Surgical History:  ?Procedure Laterality Date  ? AMPUTATION TOE Bilateral 07/25/2016  ?  Procedure: bilateral 2nd toe amputations;  Surgeon: JWylene Simmer MD;  [place]  Service: Orthopedics;  Laterality: Bilateral;  ? BASAL CELL CARCINOMA EXCISION    ? CARDIOVERSION N/A 09/10/2016  ? Procedure: CARDIOVERSION;  Surgeon: GAdrian Prows MD;  [place]  Service: Cardiovascular;  Laterality: N/A;  ? CARDIOVERSION N/A 07/29/2017  ? Procedure: CARDIOVERSION;  Surgeon: PNigel Mormon MD;  [place]  Service: Cardiovascular;  Laterality: N/A;  ? CARDIOVERSION N/A 07/18/2020  ? Procedure: CARDIOVERSION;  Surgeon: GAdrian Prows MD;  [place]  Service: Cardiovascular;  Laterality: N/A;  ? FOOT SURGERY Left   ? JOINT REPLACEMENT Right   ? VEIN LIGATION AND STRIPPING Right   ? ?Family History  ?Problem Relation Age of Onset  ? Heart disease Mother   ? Stroke Mother   ? Colon cancer Father   ? Heart disease Brother   ? Healthy Child   ? Healthy Child   ? Healthy Child   ? Healthy Grandchild   ? Healthy Grandchild   ? Healthy Grandchild   ? Healthy Grandchild   ? Healthy Grandchild   ? ?Social History  ? ?Tobacco Use  ? Smoking status: Former  ?  Packs/day: 0.25  ?  Years: 5.00  ?  Pack years: 1.25  ?  Types: Cigarettes  ?  Quit date: 1970  ?  Years since quitting: 53.2  ? Smokeless tobacco: Never  ? Tobacco comments:  ?  social  ?Substance Use Topics  ? Alcohol use: Not Currently  ?  Alcohol/week: 1.0 standard drink  ?  Types: 1 Glasses of wine per week  ?  Comment: Occassionally  ?Marital Status: Married  ? ?ROS  ?Review of Systems  ?Constitutional: Negative for malaise/fatigue and weight gain.  ?Cardiovascular:  Positive for dyspnea on exertion (mild, stable). Negative for chest pain, claudication, leg swelling, near-syncope, orthopnea, palpitations, paroxysmal nocturnal dyspnea and syncope.  ?Respiratory:  Negative for shortness of breath.   ?Hematologic/Lymphatic: Does not bruise/bleed easily.  ?Gastrointestinal:  Negative for melena.   ?Neurological:  Negative for dizziness.  ?Psychiatric/Behavioral:  The patient has insomnia.   ? ?Objective  ?Blood pressure (!) 111/51, pulse 82, temperature 98.2 ?F (36.8 ?C), temperature source Temporal, resp. rate 17, height '5\' 5"'$  (1.651 m), weight 146 lb (66.2 kg), SpO2 97 %.  ? ?  08/08/2021  ?  9:58 AM 08/08/2021  ?  9:42 AM 02/09/2021  ? 10:50 AM  ?Vitals with BMI  ?Height  '5\' 5"'$  '5\' 5"'$   ?Weight  146 lbs 145 lbs 3 oz  ?BMI  24.3 24.16  ?Systolic 093 64 235  ?Diastolic 51 36 44  ?Pulse 82 96 72  ?  ? Physical Exam ?Vitals reviewed.  ?Constitutional:   ?   General: She is not in acute distress. ?Neck:  ?   Thyroid: No thyromegaly.  ?Cardiovascular:  ?   Rate and Rhythm: Normal rate. Rhythm irregular.  ?   Pulses: Normal pulses and intact distal pulses.     ?     Carotid pulses are  on the right side with bruit and  on the left side with bruit. ?   Heart sounds: Murmur heard.  ?Blowing midsystolic murmur is present with a grade of 2/6 at the lower left sternal border.  ?  No gallop.  ?   Comments: No JVD. ?Pulmonary:  ?   Effort: Pulmonary effort is normal. No respiratory distress.  ?   Breath sounds: Normal breath sounds. No wheezing or rales.  ?Musculoskeletal:  ?   Right lower leg: No edema.  ?   Left lower leg: No edema.  ?Skin: ?   General: Skin is warm and dry.  ?Neurological:  ?   Mental Status: She is alert.  ? ?Laboratory examination:  ? ? ?  Latest Ref Rng & Units 07/18/2020  ? 12:47 PM 06/30/2020  ?  2:54 PM 07/27/2016  ? 10:52 AM  ?CMP  ?Glucose 70 - 99 mg/dL 95   92   87    ?BUN 8 - 23 mg/dL '31   26   18    '$ ?Creatinine 0.44 - 1.00 mg/dL 1.00   1.24   0.95    ?Sodium 135 - 145 mmol/L 140   140   139    ?Potassium 3.5 - 5.1 mmol/L 4.3   4.6   3.9    ?Chloride 98 - 111 mmol/L 107   103   103    ?CO2 20 - 29 mmol/L  21   28    ?Calcium 8.7 - 10.3 mg/dL  9.5   9.6    ? ? ?  Latest Ref Rng & Units 07/18/2020  ? 12:47 PM 03/17/2019  ? 12:41 PM 07/27/2016  ? 10:52 AM  ?CBC  ?WBC 3.8 - 10.8 Thousand/uL  9.8   6.9     ?Hemoglobin  12.0 - 15.0 g/dL 11.6   11.0   11.9    ?Hematocrit 36.0 - 46.0 % 34.0   33.3   36.2    ?Platelets 140 - 400 Thousand/uL  175   182    ? ?Lipid Panel  ?No results found for: CHOL, TRIG, HDL, CHOLHDL, VLDL, LDLCALC, LDLDIRECT ?HEMOGLOBIN A1C ?No results found for: HGBA1C, MPG ?TSH ?No results for input(s): TSH in the last 8760 hours. ? ?External labs:  ? 07/20/2021: ?Creatinine 1.4, GFR 43, sodium 141, potassium 4.8, BUN 26 ?Hgb 11.7, HCT 34.1, MCV 99.5, platelet 172 ?Total cholesterol 130, triglycerides 67, HDL 56, LDL 61 ?Lipoprotein B53 ?TSH 4.22 ?B34.5 ? ?Cholesterol, total 158.000 m 06/08/2019 ?HDL 76 MG/DL 06/08/2019 ?LDL 73.000 mg 06/08/2019 ?Triglycerides 45.000 06/08/2019 ? ?A1C 5.100 % 06/09/2019 ?TSH 1.960 06/08/2019 ? ?Hemoglobin 11.400 g/d 06/08/2019 ? ?Creatinine, Serum 1.000 mg/ 06/08/2019 ?ALT (SGPT) 14.000 uni 06/08/2019 ? ?Allergies  ? ?Allergies  ?Allergen Reactions  ? Doxycycline   ?  Other reaction(s): photosensitivity  ?  ?Medications Prior to Visit:  ? ?Outpatient Medications Prior to Visit  ?Medication Sig Dispense Refill  ? apixaban (ELIQUIS) 5 MG TABS tablet Take 5 mg by mouth 2 (two) times daily after a meal.    ? atorvastatin (LIPITOR) 40 MG tablet TAKE 1 TABLET DAILY 90 tablet 3  ? cephALEXin (KEFLEX) 500 MG capsule Take 500 mg by mouth as needed. 1 hour prior to dental procedures    ? Cholecalciferol (VITAMIN D) 2000 units tablet Take 2,000 Units by mouth 2 (two) times daily after a meal.    ? colchicine 0.6 MG tablet Take 0.6 mg by mouth 2 (two) times daily.    ? dronedarone (MULTAQ) 400 MG tablet Take 1 tablet (400 mg total) by mouth 2 (two) times daily after a meal. 180 tablet 3  ? fluticasone (FLONASE) 50 MCG/ACT nasal spray fluticasone propionate 50 mcg/actuation nasal spray,suspension ? SHAKE LIQUID AND USE 1 SPRAY IN EACH NOSTRIL EVERY DAY    ? Glycerin-Hypromellose-PEG 400 (DRY EYE RELIEF DROPS) 0.2-0.2-1 % SOLN Place 2 drops into both eyes in the morning and at bedtime.    ? losartan  (COZAAR) 25 MG tablet Take 1 tablet (25 mg total) by mouth at bedtime. 90 tablet 3  ? metoprolol succinate (TOPROL-XL) 50 MG 24 hr tablet Take 50 mg by mouth daily.    ? Multiple Vitamins-Minerals (PRESERVISION AREDS PO)

## 2021-08-08 ENCOUNTER — Encounter: Payer: Self-pay | Admitting: Student

## 2021-08-08 ENCOUNTER — Ambulatory Visit: Payer: Medicare HMO | Admitting: Student

## 2021-08-08 VITALS — BP 111/51 | HR 82 | Temp 98.2°F | Resp 17 | Ht 65.0 in | Wt 146.0 lb

## 2021-08-08 DIAGNOSIS — I480 Paroxysmal atrial fibrillation: Secondary | ICD-10-CM

## 2021-08-08 DIAGNOSIS — I10 Essential (primary) hypertension: Secondary | ICD-10-CM

## 2021-08-08 DIAGNOSIS — E7800 Pure hypercholesterolemia, unspecified: Secondary | ICD-10-CM

## 2021-08-10 ENCOUNTER — Ambulatory Visit: Payer: Medicare HMO | Admitting: Student

## 2021-08-15 NOTE — H&P (View-Only) (Signed)
? ?Primary Physician/Referring:  Prince Solian, MD ? ?Patient ID: Kimberly Villegas, female    DOB: 03/08/1935, 87 y.o.   MRN: [id number] ? ?Chief Complaint  ?Patient presents with  ? Atrial Fibrillation  ? Follow-up  ? ?HPI:   ? ?Kimberly Villegas  is a 87 y.o.  female with paroxysmal atrial fibrillation, s/p cardioversion on 09/2016, 07/29/17, mild to moderate aortic and mitral regurgitation and very mild asymptomatic left subclavian artery stenosis, carotid atherosclerosis, hypertension, and hyperlipidemia.  She also had nonischemic cardiomyopathy with EF 40% when she was in atrial fibrillation which improved back to normal after she maintained sinus rhythm in 2018. ? ?Due to mildly reduced LVEF and fatigue and dyspnea, underwent direct-current cardioversion on 07/18/2020 which was successful. She is tolerating Multaq without any side effects.  ? ?Patient presents for 1 week follow up. At last visit EKG revealed recurrence of atrial flutter therefore advised patient to follow up in 1 week for repeat EKG to consider cardioversion.  Patient remains asymptomatic without chest pain, palpitations, dizziness, dyspnea, orthopnea, PND.  Blood pressure remains soft. ? ?Past Medical History:  ?Diagnosis Date  ? A-fib ([place])   ? Anemia, chronic disease   ? Arthritis   ? Asthma   ? Bilateral carotid bruits   ? CAD (coronary artery disease)   ? Cervical stenosis of spine   ? Cough   ? Diastolic dysfunction   ? DVT (deep venous thrombosis) ([place])   ? after vein stripping years ago  ? GERD (gastroesophageal reflux disease)   ? Hyperlipemia   ? Hypertension   ? Insomnia   ? New onset a-fib [facility]) 2018  ? Seasonal allergies   ? Syncope   ? Vitamin D deficiency   ? ?Past Surgical History:  ?Procedure Laterality Date  ? AMPUTATION TOE Bilateral 07/25/2016  ? Procedure: bilateral 2nd toe amputations;  Surgeon: Wylene Simmer, MD;  Location: [place];  Service: Orthopedics;  Laterality: Bilateral;  ? BASAL CELL CARCINOMA  EXCISION    ? CARDIOVERSION N/A 09/10/2016  ? Procedure: CARDIOVERSION;  Surgeon: Adrian Prows, MD;  Location: [place];  Service: Cardiovascular;  Laterality: N/A;  ? CARDIOVERSION N/A 07/29/2017  ? Procedure: CARDIOVERSION;  Surgeon: Nigel Mormon, MD;  Location: [place];  Service: Cardiovascular;  Laterality: N/A;  ? CARDIOVERSION N/A 07/18/2020  ? Procedure: CARDIOVERSION;  Surgeon: Adrian Prows, MD;  Location: [place];  Service: Cardiovascular;  Laterality: N/A;  ? FOOT SURGERY Left   ? JOINT REPLACEMENT Right   ? VEIN LIGATION AND STRIPPING Right   ? ?Family History  ?Problem Relation Age of Onset  ? Heart disease Mother   ? Stroke Mother   ? Colon cancer Father   ? Heart disease Brother   ? Healthy Child   ? Healthy Child   ? Healthy Child   ? Healthy Grandchild   ? Healthy Grandchild   ? Healthy Grandchild   ? Healthy Grandchild   ? Healthy Grandchild   ? ?Social History  ? ?Tobacco Use  ? Smoking status: Former  ?  Packs/day: 0.25  ?  Years: 5.00  ?  Pack years: 1.25  ?  Types: Cigarettes  ?  Quit date: 1970  ?  Years since quitting: 53.3  ? Smokeless tobacco: Never  ? Tobacco comments:  ?  social  ?Substance Use Topics  ? Alcohol use: Not Currently  ?  Alcohol/week: 1.0 standard drink  ?  Types: 1 Glasses of wine per week  ?  Comment: Occassionally  ?Marital Status: Married  ? ?ROS  ?Review of Systems  ?Constitutional: Negative for malaise/fatigue and weight gain.  ?Cardiovascular:  Positive for dyspnea on exertion (mild, stable). Negative for chest pain, claudication, leg swelling, near-syncope, orthopnea, palpitations, paroxysmal nocturnal dyspnea and syncope.  ?Respiratory:  Negative for shortness of breath.   ?Hematologic/Lymphatic: Does not bruise/bleed easily.  ?Gastrointestinal:  Negative for melena.  ?Neurological:  Negative for dizziness.  ?Psychiatric/Behavioral:  The patient has insomnia.   ? ?Objective  ?Blood pressure (!) 105/50, pulse 88, temperature 97.6 ?F (36.4 ?C),  temperature source Temporal, resp. rate 16, height '5\' 5"'$  (1.651 m), weight 142 lb 12.8 oz (64.8 kg), SpO2 98 %.  ? ?  08/16/2021  ? 10:09 AM 08/08/2021  ?  9:58 AM 08/08/2021  ?  9:42 AM  ?Vitals with BMI  ?Height '5\' 5"'$   '5\' 5"'$   ?Weight 142 lbs 13 oz  146 lbs  ?BMI 23.76  24.3  ?Systolic 341 962 64  ?Diastolic 50 51 36  ?Pulse 88 82 96  ?  ? Physical Exam ?Vitals reviewed.  ?Constitutional:   ?   General: She is not in acute distress. ?Neck:  ?   Thyroid: No thyromegaly.  ?Cardiovascular:  ?   Rate and Rhythm: Normal rate. Rhythm irregular.  ?   Pulses: Normal pulses and intact distal pulses.     ?     Carotid pulses are  on the right side with bruit and  on the left side with bruit. ?   Heart sounds: Murmur heard.  ?Blowing midsystolic murmur is present with a grade of 2/6 at the lower left sternal border.  ?  No gallop.  ?   Comments: No JVD. ?Pulmonary:  ?   Effort: Pulmonary effort is normal. No respiratory distress.  ?   Breath sounds: Normal breath sounds. No wheezing or rales.  ?Musculoskeletal:  ?   Right lower leg: No edema.  ?   Left lower leg: No edema.  ?Skin: ?   General: Skin is warm and dry.  ?Neurological:  ?   Mental Status: She is alert.  ? ?Laboratory examination:  ? ? ?  Latest Ref Rng & Units 07/18/2020  ? 12:47 PM 06/30/2020  ?  2:54 PM 07/27/2016  ? 10:52 AM  ?CMP  ?Glucose 70 - 99 mg/dL 95   92   87    ?BUN 8 - 23 mg/dL '31   26   18    '$ ?Creatinine 0.44 - 1.00 mg/dL 1.00   1.24   0.95    ?Sodium 135 - 145 mmol/L 140   140   139    ?Potassium 3.5 - 5.1 mmol/L 4.3   4.6   3.9    ?Chloride 98 - 111 mmol/L 107   103   103    ?CO2 20 - 29 mmol/L  21   28    ?Calcium 8.7 - 10.3 mg/dL  9.5   9.6    ? ? ?  Latest Ref Rng & Units 07/18/2020  ? 12:47 PM 03/17/2019  ? 12:41 PM 07/27/2016  ? 10:52 AM  ?CBC  ?WBC 3.8 - 10.8 Thousand/uL  9.8   6.9    ?Hemoglobin 12.0 - 15.0 g/dL 11.6   11.0   11.9    ?Hematocrit 36.0 - 46.0 % 34.0   33.3   36.2    ?Platelets 140 - 400 Thousand/uL  175   182    ? ?Lipid Panel  ?  No  results found for: CHOL, TRIG, HDL, CHOLHDL, VLDL, LDLCALC, LDLDIRECT ?HEMOGLOBIN A1C ?No results found for: HGBA1C, MPG ?TSH ?No results for input(s): TSH in the last 8760 hours. ? ?External labs:  ? 07/20/2021: ?Creatinine 1.4, GFR 43, sodium 141, potassium 4.8, BUN 26 ?Hgb 11.7, HCT 34.1, MCV 99.5, platelet 172 ?Total cholesterol 130, triglycerides 67, HDL 56, LDL 61 ?Lipoprotein B53 ?TSH 4.22 ?B34.5 ? ?Cholesterol, total 158.000 m 06/08/2019 ?HDL 76 MG/DL 06/08/2019 ?LDL 73.000 mg 06/08/2019 ?Triglycerides 45.000 06/08/2019 ? ?A1C 5.100 % 06/09/2019 ?TSH 1.960 06/08/2019 ? ?Hemoglobin 11.400 g/d 06/08/2019 ? ?Creatinine, Serum 1.000 mg/ 06/08/2019 ?ALT (SGPT) 14.000 uni 06/08/2019 ? ?Allergies  ? ?Allergies  ?Allergen Reactions  ? Doxycycline   ?  Other reaction(s): photosensitivity  ?  ?Medications Prior to Visit:  ? ?Outpatient Medications Prior to Visit  ?Medication Sig Dispense Refill  ? apixaban (ELIQUIS) 5 MG TABS tablet Take 5 mg by mouth 2 (two) times daily after a meal.    ? atorvastatin (LIPITOR) 40 MG tablet TAKE 1 TABLET DAILY 90 tablet 3  ? cephALEXin (KEFLEX) 500 MG capsule Take 500 mg by mouth as needed. 1 hour prior to dental procedures    ? Cholecalciferol (VITAMIN D) 2000 units tablet Take 2,000 Units by mouth 2 (two) times daily after a meal.    ? dronedarone (MULTAQ) 400 MG tablet Take 1 tablet (400 mg total) by mouth 2 (two) times daily after a meal. 180 tablet 3  ? fluticasone (FLONASE) 50 MCG/ACT nasal spray fluticasone propionate 50 mcg/actuation nasal spray,suspension ? SHAKE LIQUID AND USE 1 SPRAY IN EACH NOSTRIL EVERY DAY    ? Glycerin-Hypromellose-PEG 400 (DRY EYE RELIEF DROPS) 0.2-0.2-1 % SOLN Place 2 drops into both eyes in the morning and at bedtime.    ? losartan (COZAAR) 25 MG tablet Take 1 tablet (25 mg total) by mouth at bedtime. 90 tablet 3  ? metoprolol succinate (TOPROL-XL) 50 MG 24 hr tablet Take 50 mg by mouth daily.    ? Multiple Vitamins-Minerals (PRESERVISION AREDS PO) Take 1 capsule by  mouth 2 (two) times daily after a meal.    ? NON FORMULARY Take 3 tablets by mouth daily. Bone strength    ? Probiotic Product (ALIGN) 4 MG CAPS Take 4 mg by mouth daily as needed (bloating).    ? Turmeric 500 MG CAP

## 2021-08-15 NOTE — Progress Notes (Signed)
? ?Primary Physician/Referring:  Prince Solian, MD ? ?Patient ID: Kimberly Villegas, female    DOB: [birth year], 87 y.o.   MRN: [id number] ? ?Chief Complaint  ?Patient presents with  ? Atrial Fibrillation  ? Follow-up  ? ?HPI:   ? ?Kimberly Villegas  is a 87 y.o.  female with paroxysmal atrial fibrillation, s/p cardioversion on 09/2016, 07/29/17, mild to moderate aortic and mitral regurgitation and very mild asymptomatic left subclavian artery stenosis, carotid atherosclerosis, hypertension, and hyperlipidemia.  She also had nonischemic cardiomyopathy with EF 40% when she was in atrial fibrillation which improved back to normal after she maintained sinus rhythm in 2018. ? ?Due to mildly reduced LVEF and fatigue and dyspnea, underwent direct-current cardioversion on 07/18/2020 which was successful. She is tolerating Multaq without any side effects.  ? ?Patient presents for 1 week follow up. At last visit EKG revealed recurrence of atrial flutter therefore advised patient to follow up in 1 week for repeat EKG to consider cardioversion.  Patient remains asymptomatic without chest pain, palpitations, dizziness, dyspnea, orthopnea, PND.  Blood pressure remains soft. ? ?Past Medical History:  ?Diagnosis Date  ? A-fib ([place])   ? Anemia, chronic disease   ? Arthritis   ? Asthma   ? Bilateral carotid bruits   ? CAD (coronary artery disease)   ? Cervical stenosis of spine   ? Cough   ? Diastolic dysfunction   ? DVT (deep venous thrombosis) ([place])   ? after vein stripping years ago  ? GERD (gastroesophageal reflux disease)   ? Hyperlipemia   ? Hypertension   ? Insomnia   ? New onset a-fib [facility]) 2018  ? Seasonal allergies   ? Syncope   ? Vitamin D deficiency   ? ?Past Surgical History:  ?Procedure Laterality Date  ? AMPUTATION TOE Bilateral 07/25/2016  ? Procedure: bilateral 2nd toe amputations;  Surgeon: Wylene Simmer, MD;  Location: [place];  Service: Orthopedics;  Laterality: Bilateral;  ? BASAL CELL CARCINOMA  EXCISION    ? CARDIOVERSION N/A 09/10/2016  ? Procedure: CARDIOVERSION;  Surgeon: Adrian Prows, MD;  Location: [place];  Service: Cardiovascular;  Laterality: N/A;  ? CARDIOVERSION N/A 07/29/2017  ? Procedure: CARDIOVERSION;  Surgeon: Nigel Mormon, MD;  Location: [place];  Service: Cardiovascular;  Laterality: N/A;  ? CARDIOVERSION N/A 07/18/2020  ? Procedure: CARDIOVERSION;  Surgeon: Adrian Prows, MD;  Location: [place];  Service: Cardiovascular;  Laterality: N/A;  ? FOOT SURGERY Left   ? JOINT REPLACEMENT Right   ? VEIN LIGATION AND STRIPPING Right   ? ?Family History  ?Problem Relation Age of Onset  ? Heart disease Mother   ? Stroke Mother   ? Colon cancer Father   ? Heart disease Brother   ? Healthy Child   ? Healthy Child   ? Healthy Child   ? Healthy Grandchild   ? Healthy Grandchild   ? Healthy Grandchild   ? Healthy Grandchild   ? Healthy Grandchild   ? ?Social History  ? ?Tobacco Use  ? Smoking status: Former  ?  Packs/day: 0.25  ?  Years: 5.00  ?  Pack years: 1.25  ?  Types: Cigarettes  ?  Quit date: 1970  ?  Years since quitting: 53.3  ? Smokeless tobacco: Never  ? Tobacco comments:  ?  social  ?Substance Use Topics  ? Alcohol use: Not Currently  ?  Alcohol/week: 1.0 standard drink  ?  Types: 1 Glasses of wine per week  ?  Comment: Occassionally  ?Marital Status: Married  ? ?ROS  ?Review of Systems  ?Constitutional: Negative for malaise/fatigue and weight gain.  ?Cardiovascular:  Positive for dyspnea on exertion (mild, stable). Negative for chest pain, claudication, leg swelling, near-syncope, orthopnea, palpitations, paroxysmal nocturnal dyspnea and syncope.  ?Respiratory:  Negative for shortness of breath.   ?Hematologic/Lymphatic: Does not bruise/bleed easily.  ?Gastrointestinal:  Negative for melena.  ?Neurological:  Negative for dizziness.  ?Psychiatric/Behavioral:  The patient has insomnia.   ? ?Objective  ?Blood pressure (!) 105/50, pulse 88, temperature 97.6 ?F (36.4 ?C),  temperature source Temporal, resp. rate 16, height '5\' 5"'$  (1.651 m), weight 142 lb 12.8 oz (64.8 kg), SpO2 98 %.  ? ?  08/16/2021  ? 10:09 AM 08/08/2021  ?  9:58 AM 08/08/2021  ?  9:42 AM  ?Vitals with BMI  ?Height '5\' 5"'$   '5\' 5"'$   ?Weight 142 lbs 13 oz  146 lbs  ?BMI 23.76  24.3  ?Systolic 417 408 64  ?Diastolic 50 51 36  ?Pulse 88 82 96  ?  ? Physical Exam ?Vitals reviewed.  ?Constitutional:   ?   General: She is not in acute distress. ?Neck:  ?   Thyroid: No thyromegaly.  ?Cardiovascular:  ?   Rate and Rhythm: Normal rate. Rhythm irregular.  ?   Pulses: Normal pulses and intact distal pulses.     ?     Carotid pulses are  on the right side with bruit and  on the left side with bruit. ?   Heart sounds: Murmur heard.  ?Blowing midsystolic murmur is present with a grade of 2/6 at the lower left sternal border.  ?  No gallop.  ?   Comments: No JVD. ?Pulmonary:  ?   Effort: Pulmonary effort is normal. No respiratory distress.  ?   Breath sounds: Normal breath sounds. No wheezing or rales.  ?Musculoskeletal:  ?   Right lower leg: No edema.  ?   Left lower leg: No edema.  ?Skin: ?   General: Skin is warm and dry.  ?Neurological:  ?   Mental Status: She is alert.  ? ?Laboratory examination:  ? ? ?  Latest Ref Rng & Units 07/18/2020  ? 12:47 PM 06/30/2020  ?  2:54 PM 07/27/2016  ? 10:52 AM  ?CMP  ?Glucose 70 - 99 mg/dL 95   92   87    ?BUN 8 - 23 mg/dL '31   26   18    '$ ?Creatinine 0.44 - 1.00 mg/dL 1.00   1.24   0.95    ?Sodium 135 - 145 mmol/L 140   140   139    ?Potassium 3.5 - 5.1 mmol/L 4.3   4.6   3.9    ?Chloride 98 - 111 mmol/L 107   103   103    ?CO2 20 - 29 mmol/L  21   28    ?Calcium 8.7 - 10.3 mg/dL  9.5   9.6    ? ? ?  Latest Ref Rng & Units 07/18/2020  ? 12:47 PM 03/17/2019  ? 12:41 PM 07/27/2016  ? 10:52 AM  ?CBC  ?WBC 3.8 - 10.8 Thousand/uL  9.8   6.9    ?Hemoglobin 12.0 - 15.0 g/dL 11.6   11.0   11.9    ?Hematocrit 36.0 - 46.0 % 34.0   33.3   36.2    ?Platelets 140 - 400 Thousand/uL  175   182    ? ?Lipid Panel  ?  No  results found for: CHOL, TRIG, HDL, CHOLHDL, VLDL, LDLCALC, LDLDIRECT ?HEMOGLOBIN A1C ?No results found for: HGBA1C, MPG ?TSH ?No results for input(s): TSH in the last 8760 hours. ? ?External labs:  ? 07/20/2021: ?Creatinine 1.4, GFR 43, sodium 141, potassium 4.8, BUN 26 ?Hgb 11.7, HCT 34.1, MCV 99.5, platelet 172 ?Total cholesterol 130, triglycerides 67, HDL 56, LDL 61 ?Lipoprotein B53 ?TSH 4.22 ?B34.5 ? ?Cholesterol, total 158.000 m 06/08/2019 ?HDL 76 MG/DL 06/08/2019 ?LDL 73.000 mg 06/08/2019 ?Triglycerides 45.000 06/08/2019 ? ?A1C 5.100 % 06/09/2019 ?TSH 1.960 06/08/2019 ? ?Hemoglobin 11.400 g/d 06/08/2019 ? ?Creatinine, Serum 1.000 mg/ 06/08/2019 ?ALT (SGPT) 14.000 uni 06/08/2019 ? ?Allergies  ? ?Allergies  ?Allergen Reactions  ? Doxycycline   ?  Other reaction(s): photosensitivity  ?  ?Medications Prior to Visit:  ? ?Outpatient Medications Prior to Visit  ?Medication Sig Dispense Refill  ? apixaban (ELIQUIS) 5 MG TABS tablet Take 5 mg by mouth 2 (two) times daily after a meal.    ? atorvastatin (LIPITOR) 40 MG tablet TAKE 1 TABLET DAILY 90 tablet 3  ? cephALEXin (KEFLEX) 500 MG capsule Take 500 mg by mouth as needed. 1 hour prior to dental procedures    ? Cholecalciferol (VITAMIN D) 2000 units tablet Take 2,000 Units by mouth 2 (two) times daily after a meal.    ? dronedarone (MULTAQ) 400 MG tablet Take 1 tablet (400 mg total) by mouth 2 (two) times daily after a meal. 180 tablet 3  ? fluticasone (FLONASE) 50 MCG/ACT nasal spray fluticasone propionate 50 mcg/actuation nasal spray,suspension ? SHAKE LIQUID AND USE 1 SPRAY IN EACH NOSTRIL EVERY DAY    ? Glycerin-Hypromellose-PEG 400 (DRY EYE RELIEF DROPS) 0.2-0.2-1 % SOLN Place 2 drops into both eyes in the morning and at bedtime.    ? losartan (COZAAR) 25 MG tablet Take 1 tablet (25 mg total) by mouth at bedtime. 90 tablet 3  ? metoprolol succinate (TOPROL-XL) 50 MG 24 hr tablet Take 50 mg by mouth daily.    ? Multiple Vitamins-Minerals (PRESERVISION AREDS PO) Take 1 capsule by  mouth 2 (two) times daily after a meal.    ? NON FORMULARY Take 3 tablets by mouth daily. Bone strength    ? Probiotic Product (ALIGN) 4 MG CAPS Take 4 mg by mouth daily as needed (bloating).    ? Turmeric 500 MG CAP

## 2021-08-16 ENCOUNTER — Ambulatory Visit: Payer: Medicare HMO | Admitting: Student

## 2021-08-16 ENCOUNTER — Encounter: Payer: Self-pay | Admitting: Student

## 2021-08-16 VITALS — BP 105/50 | HR 88 | Temp 97.6°F | Resp 16 | Ht 65.0 in | Wt 142.8 lb

## 2021-08-16 DIAGNOSIS — I480 Paroxysmal atrial fibrillation: Secondary | ICD-10-CM

## 2021-08-16 DIAGNOSIS — I4819 Other persistent atrial fibrillation: Secondary | ICD-10-CM

## 2021-08-27 ENCOUNTER — Other Ambulatory Visit: Payer: Self-pay | Admitting: Internal Medicine

## 2021-08-27 DIAGNOSIS — Z1231 Encounter for screening mammogram for malignant neoplasm of breast: Secondary | ICD-10-CM

## 2021-08-28 ENCOUNTER — Encounter (HOSPITAL_COMMUNITY): Payer: Self-pay | Admitting: Cardiology

## 2021-08-28 NOTE — Progress Notes (Signed)
Attempted to obtain medical history via telephone, unable to reach at this time. I left a voicemail to return pre surgical testing department's phone call.  

## 2021-08-30 LAB — BASIC METABOLIC PANEL
BUN/Creatinine Ratio: 19 (ref 12–28)
BUN: 30 mg/dL — ABNORMAL HIGH (ref 8–27)
CO2: 23 mmol/L (ref 20–29)
Calcium: 9.3 mg/dL (ref 8.7–10.3)
Chloride: 101 mmol/L (ref 96–106)
Creatinine, Ser: 1.57 mg/dL — ABNORMAL HIGH (ref 0.57–1.00)
Glucose: 99 mg/dL (ref 70–99)
Potassium: 4.1 mmol/L (ref 3.5–5.2)
Sodium: 137 mmol/L (ref 134–144)
eGFR: 32 mL/min/{1.73_m2} — ABNORMAL LOW (ref >59)

## 2021-08-30 LAB — CBC
Hematocrit: 32.2 % — ABNORMAL LOW (ref 34.0–46.6)
Hemoglobin: 11.3 g/dL (ref 11.1–15.9)
MCH: 35 pg — ABNORMAL HIGH (ref 26.6–33.0)
MCHC: 35.1 g/dL (ref 31.5–35.7)
MCV: 100 fL — ABNORMAL HIGH (ref 79–97)
Platelets: 179 10*3/uL (ref 150–450)
RBC: 3.23 x10E6/uL — ABNORMAL LOW (ref 3.77–5.28)
RDW: 13 % (ref 11.7–15.4)
WBC: 7.7 10*3/uL (ref 3.4–10.8)

## 2021-09-04 ENCOUNTER — Ambulatory Visit (HOSPITAL_BASED_OUTPATIENT_CLINIC_OR_DEPARTMENT_OTHER): Payer: Medicare HMO | Admitting: Anesthesiology

## 2021-09-04 ENCOUNTER — Encounter (HOSPITAL_COMMUNITY)
Admission: RE | Disposition: A | Discharge status: Home / Self Care | Payer: Self-pay | Source: Home / Self Care | Attending: Cardiology

## 2021-09-04 ENCOUNTER — Ambulatory Visit (HOSPITAL_COMMUNITY): Payer: Medicare HMO | Admitting: Anesthesiology

## 2021-09-04 ENCOUNTER — Other Ambulatory Visit: Payer: Self-pay

## 2021-09-04 ENCOUNTER — Ambulatory Visit (HOSPITAL_COMMUNITY)
Admission: RE | Admit: 2021-09-04 | Discharge: 2021-09-04 | Disposition: A | Discharge status: Home / Self Care | Payer: Medicare HMO | Attending: Cardiology | Admitting: Cardiology

## 2021-09-04 ENCOUNTER — Encounter (HOSPITAL_COMMUNITY): Payer: Self-pay | Admitting: Cardiology

## 2021-09-04 DIAGNOSIS — I129 Hypertensive chronic kidney disease with stage 1 through stage 4 chronic kidney disease, or unspecified chronic kidney disease: Secondary | ICD-10-CM | POA: Diagnosis not present

## 2021-09-04 DIAGNOSIS — I2510 Atherosclerotic heart disease of native coronary artery without angina pectoris: Secondary | ICD-10-CM | POA: Diagnosis not present

## 2021-09-04 DIAGNOSIS — I484 Atypical atrial flutter: Secondary | ICD-10-CM

## 2021-09-04 DIAGNOSIS — J45909 Unspecified asthma, uncomplicated: Secondary | ICD-10-CM | POA: Insufficient documentation

## 2021-09-04 DIAGNOSIS — K219 Gastro-esophageal reflux disease without esophagitis: Secondary | ICD-10-CM | POA: Insufficient documentation

## 2021-09-04 DIAGNOSIS — I491 Atrial premature depolarization: Secondary | ICD-10-CM | POA: Diagnosis not present

## 2021-09-04 DIAGNOSIS — Z79899 Other long term (current) drug therapy: Secondary | ICD-10-CM | POA: Diagnosis not present

## 2021-09-04 DIAGNOSIS — I4891 Unspecified atrial fibrillation: Secondary | ICD-10-CM

## 2021-09-04 DIAGNOSIS — I4892 Unspecified atrial flutter: Secondary | ICD-10-CM | POA: Diagnosis not present

## 2021-09-04 DIAGNOSIS — Z87891 Personal history of nicotine dependence: Secondary | ICD-10-CM | POA: Diagnosis not present

## 2021-09-04 DIAGNOSIS — I480 Paroxysmal atrial fibrillation: Secondary | ICD-10-CM | POA: Diagnosis not present

## 2021-09-04 DIAGNOSIS — I4819 Other persistent atrial fibrillation: Secondary | ICD-10-CM

## 2021-09-04 DIAGNOSIS — I10 Essential (primary) hypertension: Secondary | ICD-10-CM | POA: Diagnosis not present

## 2021-09-04 HISTORY — PX: CARDIOVERSION: SHX1299

## 2021-09-04 SURGERY — CARDIOVERSION
Anesthesia: General

## 2021-09-04 MED ORDER — SODIUM CHLORIDE 0.9 % IV SOLN: INTRAVENOUS | Status: DC

## 2021-09-04 MED ORDER — LIDOCAINE 2% (20 MG/ML) 5 ML SYRINGE
INTRAMUSCULAR | Status: DC | PRN
  Administered 2021-09-04: 20 mg via INTRAVENOUS

## 2021-09-04 MED ORDER — PROPOFOL 10 MG/ML IV BOLUS
INTRAVENOUS | Status: DC | PRN
  Administered 2021-09-04: 60 mg via INTRAVENOUS

## 2021-09-04 NOTE — Interval H&P Note (Signed)
History and Physical Interval Note: ? ?09/04/2021 ?9:03 AM ? ?Kimberly Villegas  has presented today for surgery, with the diagnosis of AFIB.  The various methods of treatment have been discussed with the patient and family. After consideration of risks, benefits and other options for treatment, the patient has consented to  Procedure(s): ?CARDIOVERSION (N/A) as a surgical intervention.  The patient's history has been reviewed, patient examined, no change in status, stable for surgery.  I have reviewed the patient's chart and labs.  Questions were answered to the patient's satisfaction.   ? ? ?Adrian Prows ? ? ?

## 2021-09-04 NOTE — Anesthesia Postprocedure Evaluation (Signed)
Anesthesia Post Note ? ?Patient: Kimberly Villegas ? ?Procedure(s) Performed: CARDIOVERSION ? ?  ? ?Patient location during evaluation: PACU ?Anesthesia Type: General ?Level of consciousness: awake and alert ?Pain management: pain level controlled ?Vital Signs Assessment: post-procedure vital signs reviewed and stable ?Respiratory status: spontaneous breathing, nonlabored ventilation, respiratory function stable and patient connected to nasal cannula oxygen ?Cardiovascular status: blood pressure returned to baseline and stable ?Postop Assessment: no apparent nausea or vomiting ?Anesthetic complications: no ? ? ?No notable events documented. ? ?Last Vitals:  ?Vitals:  ? 09/04/21 0925 09/04/21 0935  ?BP: (!) 120/38 (!) 127/38  ?Pulse: 67 65  ?Resp: 17 15  ?Temp:    ?SpO2: 97% 96%  ?  ?Last Pain:  ?Vitals:  ? 09/04/21 0935  ?TempSrc:   ?PainSc: 0-No pain  ? ? ?  ?  ?  ?  ?  ?  ? ?Suzette Battiest E ? ? ? ? ?

## 2021-09-04 NOTE — Discharge Instructions (Signed)

## 2021-09-04 NOTE — CV Procedure (Signed)
Direct current cardioversion 09/04/2021 9:20 AM ? ?Indication symptomatic A. Fibrillation/atypical atrial flutter. ? ?Procedure: Using 60 mg of IV Propofol and 20 IV Lidocaine (for reducing venous pain) for achieving deep sedation, synchronized direct current cardioversion performed. Patient was delivered with 75 Joules of electricity X 1 with success to NSR. Patient tolerated the procedure well. No immediate complication noted.  ? ?Allergies as of 09/04/2021   ? ?   Reactions  ? Doxycycline   ? photosensitivity  ? ?  ? ?  ?Medication List  ?  ? ?TAKE these medications   ? ?Align 4 MG Caps ?Take 4 mg by mouth daily as needed (bloating). ?  ?apixaban 5 MG Tabs tablet ?Commonly known as: ELIQUIS ?Take 5 mg by mouth 2 (two) times daily after a meal. ?  ?atorvastatin 40 MG tablet ?Commonly known as: LIPITOR ?TAKE 1 TABLET DAILY ?What changed: when to take this ?  ?BENEFIBER PO ?Take 1 Dose by mouth daily. ?  ?cephALEXin 500 MG capsule ?Commonly known as: KEFLEX ?Take 2,000 mg by mouth See admin instructions. 1 hour prior to dental procedures ?  ?cholecalciferol 25 MCG (1000 UNIT) tablet ?Commonly known as: VITAMIN D3 ?Take 1,000 Units by mouth daily. ?  ?fluticasone 50 MCG/ACT nasal spray ?Commonly known as: FLONASE ?Place 1 spray into both nostrils daily. ?  ?losartan 25 MG tablet ?Commonly known as: COZAAR ?Take 1 tablet (25 mg total) by mouth at bedtime. ?  ?metoprolol succinate 50 MG 24 hr tablet ?Commonly known as: TOPROL-XL ?Take 50 mg by mouth daily. ?  ?Multaq 400 MG tablet ?Generic drug: dronedarone ?Take 1 tablet (400 mg total) by mouth 2 (two) times daily after a meal. ?  ?NON FORMULARY ?Take 1-2 tablets by mouth See admin instructions. Bone strength supplement, Take 1 tablet in the morning and 2 tablets in the evening ?  ?PreserVision AREDS 2 Caps ?Take 1 capsule by mouth 2 (two) times daily. ?  ?RETAINE MGD OP ?Place 2 drops into both eyes 2 (two) times daily. ?  ?ROC RETINOL CORREXION EX ?Apply 1 application.  topically at bedtime. ?  ?Turmeric 500 MG Caps ?Take 500 mg by mouth daily. ?  ?zaleplon 5 MG capsule ?Commonly known as: SONATA ?Take 5 mg by mouth at bedtime as needed for sleep. ?  ? ?  ? ? ? ? ?Adrian Prows, MD, [facility] ?09/04/2021, 9:20 AM ?Office: [phone] ?Fax: [phone] ?Pager: [phone]  ? ?

## 2021-09-04 NOTE — Anesthesia Preprocedure Evaluation (Signed)
Anesthesia Evaluation  ?Patient identified by MRN, date of birth, ID band ?Patient awake ? ? ? ?Reviewed: ?Allergy & Precautions, NPO status , Patient's Chart, lab work & pertinent test results ? ?Airway ?Mallampati: II ? ?TM Distance: >3 FB ?Neck ROM: Full ? ? ? Dental ? ?(+) Dental Advisory Given ?  ?Pulmonary ?asthma , former smoker,  ?  ?breath sounds clear to auscultation ? ? ? ? ? ? Cardiovascular ?hypertension, Pt. on medications and Pt. on home beta blockers ?+ CAD  ?+ dysrhythmias Atrial Fibrillation + Valvular Problems/Murmurs MR  ?Rhythm:Irregular Rate:Normal ? ?Normal EF. Mod MR ? ?  ?Neuro/Psych ?negative neurological ROS ?   ? GI/Hepatic ?Neg liver ROS, GERD  ,  ?Endo/Other  ?negative endocrine ROS ? Renal/GU ?CRFRenal disease  ? ?  ?Musculoskeletal ? ?(+) Arthritis ,  ? Abdominal ?  ?Peds ? Hematology ?negative hematology ROS ?(+)   ?Anesthesia Other Findings ? ? Reproductive/Obstetrics ? ?  ? ? ? ? ? ? ? ? ? ? ? ? ? ?  ?  ? ? ? ? ? ? ? ? ?Anesthesia Physical ?Anesthesia Plan ? ?ASA: 3 ? ?Anesthesia Plan: General  ? ?Post-op Pain Management: Minimal or no pain anticipated  ? ?Induction: Intravenous ? ?PONV Risk Score and Plan: 3 ? ?Airway Management Planned: Mask and Natural Airway ? ?Additional Equipment:  ? ?Intra-op Plan:  ? ?Post-operative Plan:  ? ?Informed Consent: I have reviewed the patients History and Physical, chart, labs and discussed the procedure including the risks, benefits and alternatives for the proposed anesthesia with the patient or authorized representative who has indicated his/her understanding and acceptance.  ? ? ? ? ? ?Plan Discussed with:  ? ?Anesthesia Plan Comments:   ? ? ? ? ? ? ?Anesthesia Quick Evaluation ? ?

## 2021-09-04 NOTE — Transfer of Care (Signed)
Immediate Anesthesia Transfer of Care Note ? ?Patient: Kimberly Villegas ? ?Procedure(s) Performed: CARDIOVERSION ? ?Patient Location: PACU and Endoscopy Unit ? ?Anesthesia Type:General ? ?Level of Consciousness: drowsy ? ?Airway & Oxygen Therapy: Patient Spontanous Breathing ? ?Post-op Assessment: Report given to RN and Post -op Vital signs reviewed and stable ? ?Post vital signs: Reviewed and stable ? ?Last Vitals:  ?Vitals Value Taken Time  ?BP    ?Temp    ?Pulse    ?Resp    ?SpO2    ? ? ?Last Pain:  ?Vitals:  ? 09/04/21 0810  ?TempSrc: Temporal  ?PainSc: 0-No pain  ?   ? ?  ? ?Complications: No notable events documented. ?

## 2021-09-04 NOTE — Progress Notes (Signed)
EKG 09/22/2021: Normal sinus rhythm with rate of 67 bpm, normal axis.  No evidence of ischemia, normal EKG.

## 2021-09-05 ENCOUNTER — Encounter (HOSPITAL_COMMUNITY): Payer: Self-pay | Admitting: Cardiology

## 2021-09-17 ENCOUNTER — Ambulatory Visit: Payer: Medicare HMO | Admitting: Student

## 2021-09-17 ENCOUNTER — Encounter: Payer: Self-pay | Admitting: Student

## 2021-09-17 VITALS — BP 107/41 | HR 70 | Temp 98.0°F | Resp 16 | Ht 65.0 in | Wt 144.0 lb

## 2021-09-17 DIAGNOSIS — I480 Paroxysmal atrial fibrillation: Secondary | ICD-10-CM

## 2021-09-17 DIAGNOSIS — I10 Essential (primary) hypertension: Secondary | ICD-10-CM

## 2021-09-17 DIAGNOSIS — I428 Other cardiomyopathies: Secondary | ICD-10-CM

## 2021-09-17 NOTE — Progress Notes (Signed)
? ?Primary Physician/Referring:  Prince Solian, MD ? ?Patient ID: Kimberly Villegas, female    DOB: Sep 18, 1934, 88 y.o.   MRN: [id number] ? ?Chief Complaint  ?Patient presents with  ? Atrial Fibrillation  ? Follow-up  ?  Post cath  ? ?HPI:   ? ?Kimberly Villegas  is a 87 y.o.  female with paroxysmal atrial fibrillation, s/p cardioversion on 09/2016, 07/29/17, mild to moderate aortic and mitral regurgitation and very mild asymptomatic left subclavian artery stenosis, carotid atherosclerosis, hypertension, and hyperlipidemia.  She also had nonischemic cardiomyopathy with EF 40% when she was in atrial fibrillation which improved back to normal after she maintained sinus rhythm in 2018. ? ?Due to mildly reduced LVEF and fatigue and dyspnea, underwent direct-current cardioversion on 07/18/2020 which was successful. She is tolerating Multaq without any side effects.  ? ?At last office visit patient had been in persistent atrial fibrillation, therefore shared decision was to proceed with direct-current cardioversion.  Patient underwent cardioversion 09/04/2021 with successful return to normal sinus rhythm.  She now presents for follow-up.  Patient remains asymptomatic without chest pain, palpitations, dyspnea, orthopnea.  Blood pressure remains well controlled.  She can Tolinase tolerate anticoagulation without bleeding diathesis. ? ?Past Medical History:  ?Diagnosis Date  ? A-fib ([place])   ? Anemia, chronic disease   ? Arthritis   ? Asthma   ? Bilateral carotid bruits   ? CAD (coronary artery disease)   ? Cervical stenosis of spine   ? Cough   ? Diastolic dysfunction   ? DVT (deep venous thrombosis) ([place])   ? after vein stripping years ago  ? GERD (gastroesophageal reflux disease)   ? Hyperlipemia   ? Hypertension   ? Insomnia   ? New onset a-fib [facility]) 2018  ? Seasonal allergies   ? Syncope   ? Vitamin D deficiency   ? ?Past Surgical History:  ?Procedure Laterality Date  ? AMPUTATION TOE Bilateral 07/25/2016  ? Procedure:  bilateral 2nd toe amputations;  Surgeon: Wylene Simmer, MD;  Location: [place];  Service: Orthopedics;  Laterality: Bilateral;  ? BASAL CELL CARCINOMA EXCISION    ? CARDIOVERSION N/A 09/10/2016  ? Procedure: CARDIOVERSION;  Surgeon: Adrian Prows, MD;  Location: [place];  Service: Cardiovascular;  Laterality: N/A;  ? CARDIOVERSION N/A 07/29/2017  ? Procedure: CARDIOVERSION;  Surgeon: Nigel Mormon, MD;  Location: [place];  Service: Cardiovascular;  Laterality: N/A;  ? CARDIOVERSION N/A 07/18/2020  ? Procedure: CARDIOVERSION;  Surgeon: Adrian Prows, MD;  Location: [place];  Service: Cardiovascular;  Laterality: N/A;  ? CARDIOVERSION N/A 09/04/2021  ? Procedure: CARDIOVERSION;  Surgeon: Adrian Prows, MD;  Location: [place];  Service: Cardiovascular;  Laterality: N/A;  ? FOOT SURGERY Left   ? JOINT REPLACEMENT Right   ? VEIN LIGATION AND STRIPPING Right   ? ?Family History  ?Problem Relation Age of Onset  ? Heart disease Mother   ? Stroke Mother   ? Colon cancer Father   ? Heart disease Brother   ? Healthy Child   ? Healthy Child   ? Healthy Child   ? Healthy Grandchild   ? Healthy Grandchild   ? Healthy Grandchild   ? Healthy Grandchild   ? Healthy Grandchild   ? ?Social History  ? ?Tobacco Use  ? Smoking status: Former  ?  Packs/day: 0.25  ?  Years: 5.00  ?  Pack years: 1.25  ?  Types: Cigarettes  ?  Quit date: 1970  ?  Years  since quitting: 53.4  ? Smokeless tobacco: Never  ? Tobacco comments:  ?  social  ?Substance Use Topics  ? Alcohol use: Not Currently  ?  Alcohol/week: 1.0 standard drink  ?  Types: 1 Glasses of wine per week  ?  Comment: Occassionally  ?Marital Status: Married  ? ?ROS  ?Review of Systems  ?Constitutional: Negative for malaise/fatigue and weight gain.  ?Cardiovascular:  Positive for dyspnea on exertion (mild, stable). Negative for chest pain, claudication, leg swelling, near-syncope, orthopnea, palpitations, paroxysmal nocturnal dyspnea and syncope.  ?Respiratory:   Negative for shortness of breath.   ?Hematologic/Lymphatic: Does not bruise/bleed easily.  ?Gastrointestinal:  Negative for melena.  ?Neurological:  Negative for dizziness.  ?Psychiatric/Behavioral:  The patient has insomnia.   ? ?Objective  ?Blood pressure (!) 107/41, pulse 70, temperature 98 ?F (36.7 ?C), resp. rate 16, height '5\' 5"'$  (1.651 m), weight 144 lb (65.3 kg), SpO2 97 %.  ? ?  09/17/2021  ? 12:55 PM 09/04/2021  ?  9:35 AM 09/04/2021  ?  9:25 AM  ?Vitals with BMI  ?Height '5\' 5"'$     ?Weight 144 lbs    ?BMI 23.96    ?Systolic 202 542 706  ?Diastolic 41 38 38  ?Pulse 70 65 67  ?  ? Physical Exam ?Vitals reviewed.  ?Constitutional:   ?   General: She is not in acute distress. ?Neck:  ?   Thyroid: No thyromegaly.  ?Cardiovascular:  ?   Rate and Rhythm: Normal rate and regular rhythm.  ?   Pulses: Normal pulses and intact distal pulses.     ?     Carotid pulses are  on the right side with bruit and  on the left side with bruit. ?   Heart sounds: Murmur heard.  ?Blowing midsystolic murmur is present with a grade of 2/6 at the lower left sternal border.  ?  No gallop.  ?   Comments: No JVD. ?Pulmonary:  ?   Effort: Pulmonary effort is normal. No respiratory distress.  ?   Breath sounds: Normal breath sounds. No wheezing or rales.  ?Musculoskeletal:  ?   Right lower leg: No edema.  ?   Left lower leg: No edema.  ?Neurological:  ?   Mental Status: She is alert.  ? ?Laboratory examination:  ? ? ?  Latest Ref Rng & Units 08/29/2021  ? 10:27 AM 07/18/2020  ? 12:47 PM 06/30/2020  ?  2:54 PM  ?CMP  ?Glucose 70 - 99 mg/dL 99   95   92    ?BUN 8 - 27 mg/dL '30   31   26    '$ ?Creatinine 0.57 - 1.00 mg/dL 1.57   1.00   1.24    ?Sodium 134 - 144 mmol/L 137   140   140    ?Potassium 3.5 - 5.2 mmol/L 4.1   4.3   4.6    ?Chloride 96 - 106 mmol/L 101   107   103    ?CO2 20 - 29 mmol/L 23    21    ?Calcium 8.7 - 10.3 mg/dL 9.3    9.5    ? ? ?  Latest Ref Rng & Units 08/29/2021  ? 10:27 AM 07/18/2020  ? 12:47 PM 03/17/2019  ? 12:41 PM  ?CBC   ?WBC 3.4 - 10.8 x10E3/uL 7.7    9.8    ?Hemoglobin 11.1 - 15.9 g/dL 11.3   11.6   11.0    ?Hematocrit 34.0 - 46.6 %  32.2   34.0   33.3    ?Platelets 150 - 450 x10E3/uL 179    175    ? ?Lipid Panel  ?No results found for: CHOL, TRIG, HDL, CHOLHDL, VLDL, LDLCALC, LDLDIRECT ?HEMOGLOBIN A1C ?No results found for: HGBA1C, MPG ?TSH ?No results for input(s): TSH in the last 8760 hours. ? ?External labs:  ? 07/20/2021: ?Creatinine 1.4, GFR 43, sodium 141, potassium 4.8, BUN 26 ?Hgb 11.7, HCT 34.1, MCV 99.5, platelet 172 ?Total cholesterol 130, triglycerides 67, HDL 56, LDL 61 ?Lipoprotein B53 ?TSH 4.22 ?B34.5 ? ?Cholesterol, total 158.000 m 06/08/2019 ?HDL 76 MG/DL 06/08/2019 ?LDL 73.000 mg 06/08/2019 ?Triglycerides 45.000 06/08/2019 ? ?A1C 5.100 % 06/09/2019 ?TSH 1.960 06/08/2019 ? ?Hemoglobin 11.400 g/d 06/08/2019 ? ?Creatinine, Serum 1.000 mg/ 06/08/2019 ?ALT (SGPT) 14.000 uni 06/08/2019 ? ?Allergies  ? ?Allergies  ?Allergen Reactions  ? Doxycycline   ?  photosensitivity  ?  ?Medications Prior to Visit:  ? ?Outpatient Medications Prior to Visit  ?Medication Sig Dispense Refill  ? apixaban (ELIQUIS) 5 MG TABS tablet Take 5 mg by mouth 2 (two) times daily after a meal.    ? atorvastatin (LIPITOR) 40 MG tablet TAKE 1 TABLET DAILY (Patient taking differently: Take 40 mg by mouth at bedtime.) 90 tablet 3  ? cephALEXin (KEFLEX) 500 MG capsule Take 2,000 mg by mouth See admin instructions. 1 hour prior to dental procedures    ? cholecalciferol (VITAMIN D3) 25 MCG (1000 UNIT) tablet Take 1,000 Units by mouth daily.    ? dronedarone (MULTAQ) 400 MG tablet Take 1 tablet (400 mg total) by mouth 2 (two) times daily after a meal. 180 tablet 3  ? Emollient (ROC RETINOL CORREXION EX) Apply 1 application. topically at bedtime.    ? fluticasone (FLONASE) 50 MCG/ACT nasal spray Place 1 spray into both nostrils daily.    ? Light Mineral Oil-Mineral Oil (RETAINE MGD OP) Place 2 drops into both eyes 2 (two) times daily.    ? losartan (COZAAR) 25 MG tablet  Take 1 tablet (25 mg total) by mouth at bedtime. 90 tablet 3  ? metoprolol succinate (TOPROL-XL) 50 MG 24 hr tablet Take 50 mg by mouth daily.    ? Multiple Vitamins-Minerals (PRESERVISION AREDS 2) CAPS Take 1

## 2021-10-03 ENCOUNTER — Ambulatory Visit: Payer: Medicare HMO | Admitting: Cardiology

## 2021-10-03 ENCOUNTER — Encounter: Payer: Self-pay | Admitting: Cardiology

## 2021-10-03 VITALS — BP 100/60 | HR 66 | Ht 65.0 in | Wt 148.6 lb

## 2021-10-03 DIAGNOSIS — I2510 Atherosclerotic heart disease of native coronary artery without angina pectoris: Secondary | ICD-10-CM

## 2021-10-03 DIAGNOSIS — I484 Atypical atrial flutter: Secondary | ICD-10-CM | POA: Diagnosis not present

## 2021-10-03 DIAGNOSIS — I119 Hypertensive heart disease without heart failure: Secondary | ICD-10-CM

## 2021-10-03 DIAGNOSIS — I480 Paroxysmal atrial fibrillation: Secondary | ICD-10-CM | POA: Diagnosis not present

## 2021-10-03 NOTE — Patient Instructions (Signed)
Medication Instructions:  Your physician recommends that you continue on your current medications as directed. Please refer to the Current Medication list given to you today. *If you need a refill on your cardiac medications before your next appointment, please call your pharmacy*  Lab Work: None. If you have labs (blood work) drawn today and your tests are completely normal, you will receive your results only by: MyChart Message (if you have MyChart) OR A paper copy in the mail If you have any lab test that is abnormal or we need to change your treatment, we will call you to review the results.  Testing/Procedures: None.  Follow-Up: At [facility], you and your health needs are our priority.  As part of our continuing mission to provide you with exceptional heart care, we have created designated Provider Care Teams.  These Care Teams include your primary Cardiologist (physician) and Advanced Practice Providers (APPs -  Physician Assistants and Nurse Practitioners) who all work together to provide you with the care you need, when you need it.  Your physician wants you to follow-up in: 6 months with one of the following Advanced Practice Providers on your designated Care Team:    Renee Ursuy, PA-C Michael "Andy" Tillery, PA-C   We recommend signing up for the patient portal called "MyChart".  Sign up information is provided on this After Visit Summary.  MyChart is used to connect with patients for Virtual Visits (Telemedicine).  Patients are able to view lab/test results, encounter notes, upcoming appointments, etc.  Non-urgent messages can be sent to your provider as well.   To learn more about what you can do with MyChart, go to [email or web address].    Any Other Special Instructions Will Be Listed Below (If Applicable).         

## 2021-10-03 NOTE — Progress Notes (Signed)
Electrophysiology Office Note:    Date:  10/03/2021   ID:  Kimberly Villegas, DOB Mar 09, 1935, MRN [id number]  PCP:  Prince Solian, MD  [facility] Cardiologist:  None  [facility] Electrophysiologist:  Vickie Epley, MD   Referring MD: Alethia Berthold, PA*   Chief Complaint: Persistent atrial fibrillation  History of Present Illness:    Kimberly Villegas is a 87 y.o. female who presents for an evaluation of persistent atrial fibrillation at the request of Lawerance Cruel, PA-C. Their medical history includes coronary artery disease, hypertension, hyperlipidemia and chronic systolic heart failure.  The patient saw Anderson Malta on Sep 17, 2021.  The patient has had multiple prior cardioversions for her atrial fibrillation.  She has been on Multaq in the past for her atrial fibrillation.  She is also been on amiodarone but this was stopped after she had breakthrough A-fib despite treatment with amnio.  Today she tells me she has been taking Multaq twice daily.  She tolerates the medicine.  She thinks it is helping her maintain normal rhythm.  She takes Eliquis for stroke prophylaxis without bleeding issues.    Past Medical History:  Diagnosis Date   A-fib (HCC)    Anemia, chronic disease    Arthritis    Asthma    Bilateral carotid bruits    CAD (coronary artery disease)    Cervical stenosis of spine    Cough    Diastolic dysfunction    DVT (deep venous thrombosis) (HCC)    after vein stripping years ago   GERD (gastroesophageal reflux disease)    Hyperlipemia    Hypertension    Insomnia    New onset a-fib ([place]) 2018   Seasonal allergies    Syncope    Vitamin D deficiency     Past Surgical History:  Procedure Laterality Date   AMPUTATION TOE Bilateral 07/25/2016   Procedure: bilateral 2nd toe amputations;  Surgeon: Wylene Simmer, MD;  Location: [place];  Service: Orthopedics;  Laterality: Bilateral;   BASAL CELL CARCINOMA EXCISION      CARDIOVERSION N/A 09/10/2016   Procedure: CARDIOVERSION;  Surgeon: Adrian Prows, MD;  Location: [place];  Service: Cardiovascular;  Laterality: N/A;   CARDIOVERSION N/A 07/29/2017   Procedure: CARDIOVERSION;  Surgeon: Nigel Mormon, MD;  Location: [place] [facility];  Service: Cardiovascular;  Laterality: N/A;   CARDIOVERSION N/A 07/18/2020   Procedure: CARDIOVERSION;  Surgeon: Adrian Prows, MD;  Location: [place];  Service: Cardiovascular;  Laterality: N/A;   CARDIOVERSION N/A 09/04/2021   Procedure: CARDIOVERSION;  Surgeon: Adrian Prows, MD;  Location: [place];  Service: Cardiovascular;  Laterality: N/A;   FOOT SURGERY Left    JOINT REPLACEMENT Right    VEIN LIGATION AND STRIPPING Right     Current Medications: Current Meds  Medication Sig   apixaban (ELIQUIS) 5 MG TABS tablet Take 5 mg by mouth 2 (two) times daily after a meal.   atorvastatin (LIPITOR) 40 MG tablet TAKE 1 TABLET DAILY   cephALEXin (KEFLEX) 500 MG capsule Take 2,000 mg by mouth See admin instructions. 1 hour prior to dental procedures   cholecalciferol (VITAMIN D3) 25 MCG (1000 UNIT) tablet Take 1,000 Units by mouth daily.   dronedarone (MULTAQ) 400 MG tablet Take 1 tablet (400 mg total) by mouth 2 (two) times daily after a meal.   Emollient (ROC RETINOL CORREXION EX) Apply 1 application. topically at bedtime.   fluticasone (FLONASE) 50 MCG/ACT nasal spray Place 1 spray into both nostrils daily.  Light Mineral Oil-Mineral Oil (RETAINE MGD OP) Place 2 drops into both eyes 2 (two) times daily.   losartan (COZAAR) 25 MG tablet Take 1 tablet (25 mg total) by mouth at bedtime.   metoprolol succinate (TOPROL-XL) 50 MG 24 hr tablet Take 50 mg by mouth daily.   Multiple Vitamins-Minerals (PRESERVISION AREDS 2) CAPS Take 1 capsule by mouth 2 (two) times daily.   NON FORMULARY Take 1-2 tablets by mouth See admin instructions. Bone strength supplement, Take 1 tablet in the morning and 2 tablets in the evening   Probiotic  Product (ALIGN) 4 MG CAPS Take 4 mg by mouth daily as needed (bloating).   Turmeric 500 MG CAPS Take 500 mg by mouth daily.   Wheat Dextrin (BENEFIBER PO) Take 1 Dose by mouth daily.   zaleplon (SONATA) 5 MG capsule Take 5 mg by mouth at bedtime as needed for sleep.     Allergies:   Doxycycline   Social History   Socioeconomic History   Marital status: Married    Spouse name: Not on file   Number of children: 3   Years of education: Not on file   Highest education level: Not on file  Occupational History   Not on file  Tobacco Use   Smoking status: Former    Packs/day: 0.25    Years: 5.00    Pack years: 1.25    Types: Cigarettes    Quit date: 75    Years since quitting: 53.4   Smokeless tobacco: Never   Tobacco comments:    Art gallery manager Use: Never used  Substance and Sexual Activity   Alcohol use: Not Currently    Alcohol/week: 1.0 standard drink    Types: 1 Glasses of wine per week    Comment: Occassionally   Drug use: No   Sexual activity: Not on file  Other Topics Concern   Not on file  Social History Narrative   Not on file   Social Determinants of Health   Financial Resource Strain: Not on file  Food Insecurity: Not on file  Transportation Needs: Not on file  Physical Activity: Not on file  Stress: Not on file  Social Connections: Not on file     Family History: The patient's family history includes Colon cancer in her father; Healthy in her child, child, child, grandchild, grandchild, grandchild, grandchild, and grandchild; Heart disease in her brother and mother; Stroke in her mother.  ROS:   Please see the history of present illness.    All other systems reviewed and are negative.  EKGs/Labs/Other Studies Reviewed:    The following studies were reviewed today:  August 16, 2021 ECG shows atrial flutter May 26, 2020 EKG shows atrial fibrillation  February 01, 2021 echo Normal left ventricular function, 65% Moderately  dilated left and right atrium Moderate AI Moderate MR Mild TR  EKG:  The ekg ordered today demonstrates sinus rhythm   Recent Labs: 08/29/2021: BUN 30; Creatinine, Ser 1.57; Hemoglobin 11.3; Platelets 179; Potassium 4.1; Sodium 137  Recent Lipid Panel No results found for: CHOL, TRIG, HDL, CHOLHDL, VLDL, LDLCALC, LDLDIRECT  Physical Exam:    VS:  BP 100/60   Pulse 66   Ht '5\' 5"'$  (1.651 m)   Wt 148 lb 9.6 oz (67.4 kg)   SpO2 95%   BMI 24.73 kg/m     Wt Readings from Last 3 Encounters:  10/03/21 148 lb 9.6 oz (67.4 kg)  09/17/21 144 lb (65.3 kg)  09/04/21 142 lb 12.8 oz (64.8 kg)     GEN:  Well nourished, well developed in no acute distress.  Appears younger than stated age 12: Normal NECK: No JVD; No carotid bruits LYMPHATICS: No lymphadenopathy CARDIAC: RRR, no murmurs, rubs, gallops RESPIRATORY:  Clear to auscultation without rales, wheezing or rhonchi  ABDOMEN: Soft, non-tender, non-distended MUSCULOSKELETAL:  No edema; No deformity  SKIN: Warm and dry NEUROLOGIC:  Alert and oriented x 3 PSYCHIATRIC:  Normal affect       ASSESSMENT:    1. Paroxysmal atrial fibrillation (HCC)   2. Atypical atrial flutter (HCC)   3. Hypertensive heart disease without congestive heart failure   4. Atherosclerosis of native coronary artery of native heart without angina pectoris    PLAN:    In order of problems listed above:  #Paroxysmal atrial fibrillation Maintaining normal rhythm on Multaq.  On Eliquis for stroke prophylaxis.  Her A-fib is largely controlled using Multaq.  I discussed the treatment options for atrial fibrillation including ablation and antiarrhythmic drugs.  Given the good control of her A-fib using Multaq, would favor continue with this regimen.  I will plan on having her see Jonni Sanger or Joseph Art in follow-up in 6 months to reassess the control of her atrial fibrillation on the Multaq regimen.  #Hypertension Controlled.  On good medical therapy.  #Coronary  artery disease No ischemic symptoms today    Total time spent with patient today 45 minutes. This includes reviewing records, evaluating the patient and coordinating care.  Medication Adjustments/Labs and Tests Ordered: Current medicines are reviewed at length with the patient today.  Concerns regarding medicines are outlined above.  Orders Placed This Encounter  Procedures   EKG 12-Lead   No orders of the defined types were placed in this encounter.    Signed, Hilton Cork. Quentin Ore, MD, [facility], [facility] 10/03/2021 7:17 PM    Electrophysiology [facility]

## 2021-10-12 ENCOUNTER — Ambulatory Visit: Payer: Medicare HMO

## 2021-10-19 ENCOUNTER — Ambulatory Visit
Admission: RE | Admit: 2021-10-19 | Discharge: 2021-10-19 | Disposition: A | Discharge status: Home / Self Care | Payer: Medicare HMO | Source: Ambulatory Visit | Attending: Internal Medicine | Admitting: Internal Medicine

## 2021-10-19 DIAGNOSIS — Z1231 Encounter for screening mammogram for malignant neoplasm of breast: Secondary | ICD-10-CM

## 2021-10-23 ENCOUNTER — Other Ambulatory Visit: Payer: Self-pay | Admitting: Internal Medicine

## 2021-10-23 DIAGNOSIS — R928 Other abnormal and inconclusive findings on diagnostic imaging of breast: Secondary | ICD-10-CM

## 2021-10-26 ENCOUNTER — Encounter: Payer: Self-pay | Admitting: Podiatry

## 2021-10-26 ENCOUNTER — Ambulatory Visit: Payer: Medicare HMO | Admitting: Podiatry

## 2021-10-26 DIAGNOSIS — M79674 Pain in right toe(s): Secondary | ICD-10-CM | POA: Diagnosis not present

## 2021-10-26 DIAGNOSIS — Q828 Other specified congenital malformations of skin: Secondary | ICD-10-CM | POA: Diagnosis not present

## 2021-10-26 DIAGNOSIS — Z89429 Acquired absence of other toe(s), unspecified side: Secondary | ICD-10-CM | POA: Diagnosis not present

## 2021-10-26 DIAGNOSIS — B351 Tinea unguium: Secondary | ICD-10-CM

## 2021-10-26 DIAGNOSIS — E038 Other specified hypothyroidism: Secondary | ICD-10-CM | POA: Insufficient documentation

## 2021-10-26 DIAGNOSIS — M79675 Pain in left toe(s): Secondary | ICD-10-CM

## 2021-10-29 ENCOUNTER — Ambulatory Visit: Payer: Medicare HMO

## 2021-10-29 ENCOUNTER — Ambulatory Visit
Admission: RE | Admit: 2021-10-29 | Discharge: 2021-10-29 | Disposition: A | Discharge status: Home / Self Care | Payer: Medicare HMO | Source: Ambulatory Visit | Attending: Internal Medicine | Admitting: Internal Medicine

## 2021-10-29 DIAGNOSIS — R928 Other abnormal and inconclusive findings on diagnostic imaging of breast: Secondary | ICD-10-CM

## 2021-10-30 ENCOUNTER — Other Ambulatory Visit: Payer: Self-pay | Admitting: Cardiology

## 2021-11-05 NOTE — Progress Notes (Signed)
  Subjective:  Patient ID: Kimberly Villegas, female    DOB: Feb 19, 1935,  MRN: [id number]  Kimberly Villegas presents to clinic today for at risk foot care. Patient has h/o amputation of digital amputation bilateral 2nd toes and painful porokeratotic lesion(s) left lower extremity and painful mycotic toenails that limit ambulation. Painful toenails interfere with ambulation. Aggravating factors include wearing enclosed shoe gear. Pain is relieved with periodic professional debridement. Painful porokeratotic lesions are aggravated when weightbearing with and without shoegear. Pain is relieved with periodic professional debridement.  New problem(s): None.   PCP is Avva, Steva Ready, MD , and last visit was May, 2023.  Allergies  Allergen Reactions   Doxycycline     photosensitivity   Review of Systems: Negative except as noted in the HPI.  Objective: No changes noted in today's physical examination.  General: Patient is a pleasant 87 y.o. Caucasian female in NAD. AAO x 3.   Neurovascular Examination: Capillary refill time to digits immediate b/l. Palpable pedal pulses b/l LE. Pedal hair sparse. No pain with calf compression b/l. Lower extremity skin temperature gradient within normal limits. No edema noted b/l LE. No cyanosis or clubbing noted b/l LE.  Protective sensation intact 5/5 intact bilaterally with 10g monofilament b/l. Vibratory sensation intact b/l.  Dermatological:  Pedal integument with normal turgor, texture and tone BLE. No open wounds b/l LE. No interdigital macerations noted b/l LE. Toenails bilateral great toes, bilateral 3rd toes, bilateral 4th toes, and bilateral 5th toes elongated, discolored, dystrophic, thickened, and crumbly with subungual debris and tenderness to dorsal palpation. Porokeratotic lesion(s) submet head 1 left foot. No erythema, no edema, no drainage, no fluctuance.  Musculoskeletal:  Muscle strength 5/5 to all lower extremity muscle groups bilaterally.  Lower extremity amputation(s): digital amputation bilateral 2nd toes. Hallux valgus with bunion deformity noted b/l lower extremities.  Assessment/Plan: 1. Pain due to onychomycosis of toenails of both feet   2. Porokeratosis   3. Status post amputation of lesser toe, unspecified laterality ([place])     -Patient was evaluated and treated. All patient's and/or POA's questions/concerns answered on today's visit. -Patient to continue soft, supportive shoe gear daily. -Toenails bilateral great toes and 3-5 bilaterally debrided in length and girth without iatrogenic bleeding with sterile nail nipper and dremel.  -Porokeratotic lesion(s) submet head 1 left foot pared and enucleated with sterile scalpel blade without incident. Total number of lesions debrided=1. -Patient/POA to call should there be question/concern in the interim.   Return in about 3 months (around 01/26/2022).  Marzetta Board, DPM

## 2021-11-09 ENCOUNTER — Ambulatory Visit: Payer: Medicare HMO | Admitting: Student

## 2021-12-28 ENCOUNTER — Ambulatory Visit: Payer: Medicare HMO | Admitting: Podiatry

## 2021-12-31 ENCOUNTER — Encounter: Payer: Self-pay | Admitting: Podiatrist

## 2021-12-31 ENCOUNTER — Ambulatory Visit: Payer: Medicare HMO | Admitting: Podiatrist

## 2021-12-31 DIAGNOSIS — M79674 Pain in right toe(s): Secondary | ICD-10-CM

## 2021-12-31 DIAGNOSIS — Z89429 Acquired absence of other toe(s), unspecified side: Secondary | ICD-10-CM

## 2021-12-31 DIAGNOSIS — M79675 Pain in left toe(s): Secondary | ICD-10-CM

## 2021-12-31 DIAGNOSIS — B351 Tinea unguium: Secondary | ICD-10-CM

## 2021-12-31 NOTE — Progress Notes (Signed)
Chief Complaint  Patient presents with   Nail Problem    Routine nail care     Kimberly Villegas presents to clinic today for at risk foot care. Patient has h/o amputation of digital amputation R 2nd toe and L 2nd toe.  She relates painful mycotic toenails that limit ambulation. Painful toenails interfere with ambulation. Aggravating factors include wearing enclosed shoe gear. Pain is relieved with periodic professional debridement.   PCP is Avva, Ravisankar, MD   Allergies  Allergen Reactions   Doxycycline     photosensitivity   Current Outpatient Medications on File Prior to Visit  Medication Sig Dispense Refill   amiodarone (PACERONE) 100 MG tablet Take by mouth.     apixaban (ELIQUIS) 5 MG TABS tablet Take 5 mg by mouth 2 (two) times daily after a meal.     atorvastatin (LIPITOR) 40 MG tablet TAKE 1 TABLET DAILY 90 tablet 3   cholecalciferol (VITAMIN D3) 25 MCG (1000 UNIT) tablet Take 1,000 Units by mouth daily.     dronedarone (MULTAQ) 400 MG tablet Take 1 tablet (400 mg total) by mouth 2 (two) times daily after a meal. 180 tablet 3   Emollient (ROC RETINOL CORREXION EX) Apply 1 application. topically at bedtime.     fluticasone (FLONASE) 50 MCG/ACT nasal spray Place 1 spray into both nostrils daily.     Light Mineral Oil-Mineral Oil (RETAINE MGD OP) Place 2 drops into both eyes 2 (two) times daily.     losartan (COZAAR) 25 MG tablet Take 1 tablet (25 mg total) by mouth at bedtime. 90 tablet 3   losartan (COZAAR) 50 MG tablet Take by mouth.     metoprolol succinate (TOPROL-XL) 50 MG 24 hr tablet Take 50 mg by mouth daily.     Multiple Vitamins-Minerals (PRESERVISION AREDS 2) CAPS Take 1 capsule by mouth 2 (two) times daily.     NON FORMULARY Take 1-2 tablets by mouth See admin instructions. Bone strength supplement, Take 1 tablet in the morning and 2 tablets in the evening     Probiotic Product (ALIGN) 4 MG CAPS Take 4 mg by mouth daily as needed (bloating).     Turmeric 500 MG  CAPS Take 500 mg by mouth daily.     Wheat Dextrin (BENEFIBER PO) Take 1 Dose by mouth daily.     zaleplon (SONATA) 5 MG capsule Take 5 mg by mouth at bedtime as needed for sleep.     No current facility-administered medications on file prior to visit.   Objective: No changes noted in today's physical examination.   General: Patient is a pleasant 87 y.o. Caucasian female in NAD. AAO x 3.    Neurovascular Examination: Capillary refill time to digits immediate b/l. Palpable pedal pulses b/l LE. Pedal hair sparse. No pain with calf compression b/l. Lower extremity skin temperature gradient within normal limits. No edema noted b/l LE. No cyanosis or clubbing noted b/l LE.   Protective sensation intact 5/5 intact bilaterally with 10g monofilament b/l. Vibratory sensation intact b/l.   Dermatological:  Pedal integument with normal turgor, texture and tone BLE. No open wounds b/l LE. No interdigital macerations noted b/l LE. Toenails bilateral great toes, bilateral 3rd toes, bilateral 4th toes, and bilateral 5th toes elongated, discolored, dystrophic, thickened, and crumbly with subungual debris and tenderness to dorsal palpation. Minimal hyperkeratotic lesion(s) submet head 1 left foot. No erythema, no edema, no drainage, no fluctuance.   Musculoskeletal:  Muscle strength 5/5 to all lower extremity muscle groups  bilaterally. Lower extremity amputation(s): digital amputation bilateral 2nd toes. Hallux valgus with bunion deformity noted b/l lower extremities.   Assessment/Plan:   ICD-10-CM   1. Pain due to onychomycosis of toenails of both feet  B35.1    M79.675    M79.674     2. Status post amputation of lesser toe, unspecified laterality ([place])  [id number]         -Patient was evaluated and treated. All patient's and/or POA's questions/concerns answered on today's visit. -Patient to continue soft, supportive shoe gear daily. -Toenails bilateral great toes and 3-5 bilaterally debrided in  length and girth without iatrogenic bleeding with sterile nail nipper and dremel.  -Patient/POA to call should there be question/concern in the interim.

## 2022-01-17 ENCOUNTER — Other Ambulatory Visit: Payer: Self-pay

## 2022-01-17 MED ORDER — LOSARTAN POTASSIUM 25 MG PO TABS: 25.0000 mg | ORAL_TABLET | Freq: Every day | ORAL | 3 refills | Status: DC

## 2022-03-08 ENCOUNTER — Ambulatory Visit: Payer: Medicare HMO | Admitting: Podiatry

## 2022-03-18 ENCOUNTER — Ambulatory Visit: Payer: Medicare HMO

## 2022-03-18 ENCOUNTER — Ambulatory Visit: Payer: Medicare HMO | Admitting: Student

## 2022-03-20 ENCOUNTER — Ambulatory Visit: Payer: Medicare HMO | Admitting: Podiatrist

## 2022-03-20 DIAGNOSIS — B351 Tinea unguium: Secondary | ICD-10-CM

## 2022-03-20 DIAGNOSIS — Z89429 Acquired absence of other toe(s), unspecified side: Secondary | ICD-10-CM

## 2022-03-20 DIAGNOSIS — M79674 Pain in right toe(s): Secondary | ICD-10-CM

## 2022-03-20 DIAGNOSIS — M79675 Pain in left toe(s): Secondary | ICD-10-CM

## 2022-03-20 NOTE — Progress Notes (Signed)
Subjective: Kimberly Villegas is a 87 y.o. female patient who presents to office today with concern of long,mildly painful nails  while ambulating in shoes; unable to trim.  Patient denies any new cramping, numbness, burning or tingling in the legs or feet. She presents today with her husband, Rush Landmark.  They are overall doing well.   Patient Active Problem List   Diagnosis Date Noted   Subclinical hypothyroidism 10/26/2021   Acute cough 05/25/2021   COVID 05/25/2021   Upper respiratory infection 05/25/2021   Pain of left sacroiliac joint 01/29/2021   Low back pain 01/29/2021   Neck pain 10/20/2020   Abdominal bloating 09/04/2020   Atypical atrial flutter ([place])    Encounter for general adult medical examination without abnormal findings 06/08/2019   Pain in right ankle and joints of right foot 04/20/2019   Hypertensive heart disease without congestive heart failure 12/14/2018   Chronic kidney disease due to hypertension 12/08/2018   Chronic kidney disease, stage 3a ([employer]) 06/02/2018   Peripheral venous insufficiency 11/17/2017   Atrial fibrillation ([place]) 09/07/2016   Primary osteoarthritis, unspecified ankle and foot 04/25/2016   Asthma without status asthmaticus 04/01/2016   Allergic rhinitis 04/20/2015   Atherosclerotic heart disease of native coronary artery without angina pectoris 04/20/2015   Carotid artery occlusion 04/20/2015   Chronic anemia 04/20/2015   Gastro-esophageal reflux disease without esophagitis 04/20/2015   Heart disease 04/20/2015   Macular degeneration 04/20/2015   Hyperlipidemia 02/13/2015   Insomnia 02/13/2015   Polyp of colon 02/13/2015   Vitamin D deficiency 02/13/2015   Current Outpatient Medications on File Prior to Visit  Medication Sig Dispense Refill   amiodarone (PACERONE) 100 MG tablet Take by mouth.     apixaban (ELIQUIS) 5 MG TABS tablet Take 5 mg by mouth 2 (two) times daily after a meal.     atorvastatin (LIPITOR) 40 MG tablet TAKE 1 TABLET DAILY  90 tablet 3   cholecalciferol (VITAMIN D3) 25 MCG (1000 UNIT) tablet Take 1,000 Units by mouth daily.     dronedarone (MULTAQ) 400 MG tablet Take 1 tablet (400 mg total) by mouth 2 (two) times daily after a meal. 180 tablet 3   Emollient (ROC RETINOL CORREXION EX) Apply 1 application. topically at bedtime.     fluticasone (FLONASE) 50 MCG/ACT nasal spray Place 1 spray into both nostrils daily.     Light Mineral Oil-Mineral Oil (RETAINE MGD OP) Place 2 drops into both eyes 2 (two) times daily.     losartan (COZAAR) 25 MG tablet Take 1 tablet (25 mg total) by mouth at bedtime. 90 tablet 3   losartan (COZAAR) 50 MG tablet Take by mouth.     metoprolol succinate (TOPROL-XL) 50 MG 24 hr tablet Take 50 mg by mouth daily.     Multiple Vitamins-Minerals (PRESERVISION AREDS 2) CAPS Take 1 capsule by mouth 2 (two) times daily.     NON FORMULARY Take 1-2 tablets by mouth See admin instructions. Bone strength supplement, Take 1 tablet in the morning and 2 tablets in the evening     Probiotic Product (ALIGN) 4 MG CAPS Take 4 mg by mouth daily as needed (bloating).     Turmeric 500 MG CAPS Take 500 mg by mouth daily.     Wheat Dextrin (BENEFIBER PO) Take 1 Dose by mouth daily.     zaleplon (SONATA) 5 MG capsule Take 5 mg by mouth at bedtime as needed for sleep.     No current facility-administered medications on file prior  to visit.   Allergies  Allergen Reactions   Doxycycline     photosensitivity     Objective: General: Patient is awake, alert, and oriented x 3 and in no acute distress.  Integument: Skin is warm, dry and supple bilateral. Nails are tender, long, thickened and  dystrophic with subungual debris, consistent with onychomycosis, 1 and 3-5 bilateral. No signs of infection. No open lesions or preulcerative lesions present bilateral. Remaining integument unremarkable.  Vasculature:  Dorsalis Pedis pulse 2/4 bilateral. Posterior Tibial pulse  1/4 bilateral.  Capillary fill time <3 sec 1-5  bilateral. Positive hair growth to the level of the digits. Temperature gradient within normal limits. No varicosities present bilateral. No edema present bilateral.   Neurology: The patient has intact sensation measured with a 5.07/10g Semmes Weinstein Monofilament at all pedal sites bilateral . Vibratory sensation diminished bilateral with tuning fork. No Babinski sign present bilateral.   Musculoskeletal: No symptomatic pedal deformities noted bilateral. Prior amputation of the second digits noted bilateral.  Muscular strength 5/5 in all lower extremity muscular groups bilateral without pain on range of motion . No tenderness with calf compression bilateral.  Assessment and Plan:   ICD-10-CM   1. Pain due to onychomycosis of toenails of both feet  B35.1    M79.675    M79.674     2. Status post amputation of lesser toe, unspecified laterality ([place])  Z61.096        -Examined patient. -Mechanically debrided all nails x 8 using sterile nail nipper and filed with sterile dremel without incident  -Answered all patient questions -Patient to return  in 3 months for continued foot care.  -Patient advised to call the office if any problems or questions arise in the meantime.  Bronson Ing, DPM

## 2022-03-25 ENCOUNTER — Ambulatory Visit: Payer: Medicare HMO

## 2022-03-25 VITALS — BP 116/53 | HR 84 | Ht 65.0 in | Wt 143.3 lb

## 2022-03-25 DIAGNOSIS — I6523 Occlusion and stenosis of bilateral carotid arteries: Secondary | ICD-10-CM

## 2022-03-25 DIAGNOSIS — I428 Other cardiomyopathies: Secondary | ICD-10-CM

## 2022-03-25 DIAGNOSIS — I10 Essential (primary) hypertension: Secondary | ICD-10-CM

## 2022-03-25 DIAGNOSIS — I480 Paroxysmal atrial fibrillation: Secondary | ICD-10-CM

## 2022-03-25 NOTE — Progress Notes (Signed)
Primary Physician/Referring:  Prince Solian, MD  Patient ID: Kimberly Villegas, female    DOB: [birth year], 87 y.o.   MRN: [id number]  Chief Complaint  Patient presents with   Atrial Fibrillation   Hypertension   Follow-up        HPI:    Kimberly Villegas  is a 87 y.o.  female with paroxysmal atrial fibrillation, s/p cardioversion on 09/2016, 07/29/17, 07/18/2020, and 09/04/2021 with return to sinus rhythm, mild to moderate aortic and mitral regurgitation and very mild asymptomatic left subclavian artery stenosis, carotid atherosclerosis, hypertension, and hyperlipidemia.  She also had nonischemic cardiomyopathy with EF 40% when she was in atrial fibrillation which improved back to normal after she maintained sinus rhythm in 2018.  She presents today for 5-monthfollow-up.  She is doing well without any complaints today.  She has recently sold her condo and plans to move to retirement community with her husband.  She does endorse some stress related to this as she will be moving during the holidays.  She remains active and follows a healthy diet.  She denies chest pain, shortness of breath, palpitations, leg edema, orthopnea, PND, TIA/syncope.   Past Medical History:  Diagnosis Date   A-fib (HCC)    Anemia, chronic disease    Arthritis    Asthma    Bilateral carotid bruits    CAD (coronary artery disease)    Cervical stenosis of spine    Cough    Diastolic dysfunction    DVT (deep venous thrombosis) (HCC)    after vein stripping years ago   GERD (gastroesophageal reflux disease)    Hyperlipemia    Hypertension    Insomnia    [place] onset a-fib ([place]   Seasonal allergies    Syncope    Vitamin D deficiency    Past Surgical History:  Procedure Laterality Date   AMPUTATION TOE Bilateral 07/25/2016   Procedure: bilateral 2nd toe amputations;  Surgeon: JWylene Simmer MD;  [facility]  Service: Orthopedics;  Laterality: Bilateral;   BASAL CELL CARCINOMA  EXCISION     CARDIOVERSION N/A 09/10/2016   Procedure: CARDIOVERSION;  Surgeon: GAdrian Prows MD;  [place] [facility]  Service: Cardiovascular;  Laterality: N/A;   CARDIOVERSION N/A 07/29/2017   Procedure: CARDIOVERSION;  Surgeon: PNigel Mormon MD;  [facility]  Service: Cardiovascular;  Laterality: N/A;   CARDIOVERSION N/A 07/18/2020   Procedure: CARDIOVERSION;  Surgeon: GAdrian Prows MD;  [facility]  Service: Cardiovascular;  Laterality: N/A;   CARDIOVERSION N/A 09/04/2021   Procedure: CARDIOVERSION;  Surgeon: GAdrian Prows MD;  [facility];  Service: Cardiovascular;  Laterality: N/A;   FOOT SURGERY Left    JOINT REPLACEMENT Right    VEIN LIGATION AND STRIPPING Right    Family History  Problem Relation Age of Onset   Heart disease Mother    Stroke Mother    Colon cancer Father    Heart disease Brother    Healthy Child    Healthy Child    Healthy Child    Healthy Grandchild    Healthy Grandchild    Healthy Grandchild    Healthy Grandchild    Healthy Grandchild    Breast cancer Neg Hx    Social History   Tobacco Use   Smoking status: Former    Packs/day: 0.25    Years: 5.00    Total pack years: 1.25    Types: Cigarettes    Quit date: 1970  Years since quitting: 53.9   Smokeless tobacco: Never   Tobacco comments:    social  Substance Use Topics   Alcohol use: Not Currently    Alcohol/week: 1.0 standard drink of alcohol    Types: 1 Glasses of wine per week    Comment: Occassionally  Marital Status: Married   ROS  Review of Systems  Cardiovascular:  Negative for chest pain, claudication, dyspnea on exertion, leg swelling, near-syncope, orthopnea, palpitations, paroxysmal nocturnal dyspnea and syncope.  Respiratory:  Negative for shortness of breath.   Hematologic/Lymphatic: Does not bruise/bleed easily.  Gastrointestinal:  Negative for melena.  Neurological:  Negative for dizziness.    Objective  Blood pressure (!) 116/53,  pulse 84, height '5\' 5"'$  (1.651 m), weight 143 lb 4.8 oz (65 kg), SpO2 96 %.     03/25/2022   12:42 PM 10/03/2021    3:02 PM 09/17/2021   12:55 PM  Vitals with BMI  Height '5\' 5"'$  '5\' 5"'$  '5\' 5"'$   Weight 143 lbs 5 oz 148 lbs 10 oz 144 lbs  BMI 23.85 67.59 16.38  Systolic 466 599 357  Diastolic 53 60 41  Pulse 84 66 70     Physical Exam Vitals reviewed.  Constitutional:      General: She is not in acute distress. Neck:     Vascular: No JVD.  Cardiovascular:     Rate and Rhythm: Normal rate and regular rhythm.     Pulses: Normal pulses and intact distal pulses.          Carotid pulses are 2+ on the right side and 2+ on the left side.      Radial pulses are 2+ on the right side and 2+ on the left side.       Dorsalis pedis pulses are 2+ on the right side and 2+ on the left side.     Heart sounds: Murmur heard.     Blowing midsystolic murmur is present with a grade of 2/6 at the lower left sternal border.     No gallop.  Pulmonary:     Effort: Pulmonary effort is normal. No respiratory distress.     Breath sounds: Normal breath sounds. No wheezing or rales.  Musculoskeletal:     Right lower leg: No edema.     Left lower leg: No edema.  Neurological:     Mental Status: She is alert.    Laboratory examination:      Latest Ref Rng & Units 08/29/2021   10:27 AM 07/18/2020   12:47 PM 06/30/2020    2:54 PM  CMP  Glucose 70 - 99 mg/dL 99  95  92   BUN 8 - 27 mg/dL '30  31  26   '$ Creatinine 0.57 - 1.00 mg/dL 1.57  1.00  1.24   Sodium 134 - 144 mmol/L 137  140  140   Potassium 3.5 - 5.2 mmol/L 4.1  4.3  4.6   Chloride 96 - 106 mmol/L 101  107  103   CO2 20 - 29 mmol/L 23   21   Calcium 8.7 - 10.3 mg/dL 9.3   9.5       Latest Ref Rng & Units 08/29/2021   10:27 AM 07/18/2020   12:47 PM 03/17/2019   12:41 PM  CBC  WBC 3.4 - 10.8 x10E3/uL 7.7   9.8   Hemoglobin 11.1 - 15.9 g/dL 11.3  11.6  11.0   Hematocrit 34.0 - 46.6 % 32.2  34.0  33.3  Platelets 150 - 450 x10E3/uL 179   175     Lipid Panel  No results found for: "CHOL", "TRIG", "HDL", "CHOLHDL", "VLDL", "LDLCALC", "LDLDIRECT" HEMOGLOBIN A1C No results found for: "HGBA1C", "MPG" TSH No results for input(s): "TSH" in the last 8760 hours.  External labs:   07/20/2021: Creatinine 1.4, GFR 43, sodium 141, potassium 4.8, BUN 26 Hgb 11.7, HCT 34.1, MCV 99.5, platelet 172 Total cholesterol 130, triglycerides 67, HDL 56, LDL 61 Lipoprotein B53 TSH 4.22 B34.5  Cholesterol, total 158.000 m 06/08/2019 HDL 76 MG/DL 06/08/2019 LDL 73.000 mg 06/08/2019 Triglycerides 45.000 06/08/2019  A1C 5.100 % 06/09/2019 TSH 1.960 06/08/2019  Hemoglobin 11.400 g/d 06/08/2019  Creatinine, Serum 1.000 mg/ 06/08/2019 ALT (SGPT) 14.000 uni 06/08/2019  Allergies   Allergies  Allergen Reactions   Doxycycline     photosensitivity    Medications Prior to Visit:   Outpatient Medications Prior to Visit  Medication Sig Dispense Refill   apixaban (ELIQUIS) 5 MG TABS tablet Take 5 mg by mouth 2 (two) times daily after a meal.     atorvastatin (LIPITOR) 40 MG tablet TAKE 1 TABLET DAILY 90 tablet 3   cholecalciferol (VITAMIN D3) 25 MCG (1000 UNIT) tablet Take 1,000 Units by mouth daily.     dronedarone (MULTAQ) 400 MG tablet Take 1 tablet (400 mg total) by mouth 2 (two) times daily after a meal. 180 tablet 3   Emollient (ROC RETINOL CORREXION EX) Apply 1 application. topically at bedtime.     fluticasone (FLONASE) 50 MCG/ACT nasal spray Place 1 spray into both nostrils daily.     Light Mineral Oil-Mineral Oil (RETAINE MGD OP) Place 2 drops into both eyes 2 (two) times daily.     losartan (COZAAR) 25 MG tablet Take 1 tablet (25 mg total) by mouth at bedtime. 90 tablet 3   losartan (COZAAR) 50 MG tablet Take by mouth.     metoprolol succinate (TOPROL-XL) 50 MG 24 hr tablet Take 50 mg by mouth daily.     Multiple Vitamins-Minerals (PRESERVISION AREDS 2) CAPS Take 1 capsule by mouth 2 (two) times daily.     NON FORMULARY Take 1-2 tablets by mouth See  admin instructions. Bone strength supplement, Take 1 tablet in the morning and 2 tablets in the evening     Probiotic Product (ALIGN) 4 MG CAPS Take 4 mg by mouth daily as needed (bloating).     Turmeric 500 MG CAPS Take 500 mg by mouth daily.     Wheat Dextrin (BENEFIBER PO) Take 1 Dose by mouth daily.     zaleplon (SONATA) 5 MG capsule Take 5 mg by mouth at bedtime as needed for sleep.     amiodarone (PACERONE) 100 MG tablet Take by mouth. (Patient not taking: Reported on 03/25/2022)     No facility-administered medications prior to visit.   Final Medications at End of Visit    Current Meds  Medication Sig   apixaban (ELIQUIS) 5 MG TABS tablet Take 5 mg by mouth 2 (two) times daily after a meal.   atorvastatin (LIPITOR) 40 MG tablet TAKE 1 TABLET DAILY   cholecalciferol (VITAMIN D3) 25 MCG (1000 UNIT) tablet Take 1,000 Units by mouth daily.   dronedarone (MULTAQ) 400 MG tablet Take 1 tablet (400 mg total) by mouth 2 (two) times daily after a meal.   Emollient (ROC RETINOL CORREXION EX) Apply 1 application. topically at bedtime.   fluticasone (FLONASE) 50 MCG/ACT nasal spray Place 1 spray into both nostrils daily.   Light  Mineral Oil-Mineral Oil (RETAINE MGD OP) Place 2 drops into both eyes 2 (two) times daily.   losartan (COZAAR) 25 MG tablet Take 1 tablet (25 mg total) by mouth at bedtime.   losartan (COZAAR) 50 MG tablet Take by mouth.   metoprolol succinate (TOPROL-XL) 50 MG 24 hr tablet Take 50 mg by mouth daily.   Multiple Vitamins-Minerals (PRESERVISION AREDS 2) CAPS Take 1 capsule by mouth 2 (two) times daily.   NON FORMULARY Take 1-2 tablets by mouth See admin instructions. Bone strength supplement, Take 1 tablet in the morning and 2 tablets in the evening   Probiotic Product (ALIGN) 4 MG CAPS Take 4 mg by mouth daily as needed (bloating).   Turmeric 500 MG CAPS Take 500 mg by mouth daily.   Wheat Dextrin (BENEFIBER PO) Take 1 Dose by mouth daily.   zaleplon (SONATA) 5 MG  capsule Take 5 mg by mouth at bedtime as needed for sleep.     Radiology:  No results found.  Cardiac Studies:   Sleep Study 2006: Normal.  Lower extremity arterial duplex 05/24/2015:  No hemodynamically significant stenoses are identified in bilateral lower extremity arterial system. Mild plaque noted.  This exam reveals normal perfusion of both  the  lower extremities with RABI >1.14 and LABI 1.07.  Abdominal Ultrasound 02/11/2018: The maximum aorta diameter is 2.47 cm (prox) with mild ectasia. Calcific plaque noted in the abdominal aorta.  Normal flow velocities noted in the bilateral iliac arteries. Compared to 05/24/2015, Maximum aortid dilatation was 2.92. F/U studies if clinically indicated.  Successful cardioversion 09/10/2016, 07/29/2017.  Subclavian artery duplex 07/18/2016: Left subclavian artery is abnormal, monophasic, suggestive of proximal obstruction. Left vertebral artery waveform shows high resistance. Antegrade right vertebral artery flow.  Blood Pressure Right: 155/80   Left: 135/75 No hemodynamically significant arterial disease in the common carotid artery bilaterally. Compared to the study done on 05/30/2015, left subclavian artery monophasic waveform now well demonstrated.  Otherwise no significant change.  Abdominal aortic duplex 02/11/2018: The maximum aorta diameter is 2.47 cm (prox) with mild ectasia. Calcific plaque noted in the abdominal aorta.  Normal flow velocities noted in the bilateral iliac arteries. Compared to 05/24/2015, Maximum aortid dilatation was 2.92. F/U studies if clinically indicated.  Event Monitor: Episodic transmission 02/20/2017 reveals 4 beat run of NSVT at 12:20 a.m. Unscheduled transmission 03/01/17: 4.4 second ventricular standstill at 5am. Rhythm converted from atypical A. Flutter to NSR. No change in therapy for now, continue to observe  Lexiscan myoview stress test: 03/03/2017:                                                                         1. The exercise tolerance was not assessed due to pharmacologic stress. Stress EKG Test Results Normal.     2. Normal SPECT study with normal left ventricular systolic function Low risk study. Normal left ventricular systolic function. No scar or ischemia detected by nuclear imaging.  Carotid artery duplex 05/09/2020:  Minimal stenosis in the right internal carotid artery (1-15%) with heterogeneous plaque. Stenosis in the right external carotid artery (<50%).  Minimal stenosis in the left internal carotid artery (1-15%) with heterogeneous plaque. Stenosis in the left external carotid artery (<50%).  Antegrade right  vertebral artery flow. Antegrade left vertebral artery flow.  Follow up studies is appropriate when clinically indicated. Compared to 05/24/2019, right ICA stenosis of 15-49% not present.   Echocardiogram 02/01/2021:  Left ventricle cavity is normal in size and wall thickness. Normal global  wall motion. Normal LV systolic function with EF 65%. Diastolic function  not assessed due to severity of mitral regurgitation.  Left atrial cavity is moderately dilated.  Right atrial cavity is moderately dilated.  Trileaflet aortic valve.  Moderate (Grade III) aortic regurgitation.  Moderate (Grade III) mitral regurgitation.  Mild tricuspid regurgitation. Estimated pulmonary artery systolic pressure  37 mmHg.  Personally compared with previous study on 06/17/2019. No significant  change noted.  Direct current cardioversion 09/04/2021 9:20 AM  Indication symptomatic A. Fibrillation/atypical atrial flutter.  Procedure: Using 60 mg of IV Propofol and 20 IV Lidocaine (for reducing venous pain) for achieving deep sedation, synchronized direct current cardioversion performed. Patient was delivered with 75 Joules of electricity X 1 with success to NSR. Patient tolerated the procedure well. No immediate complication noted.     EKG  EKG 03/25/2022: Normal sinus rhythm with  first-degree AV block at a rate of 80 bpm.  Normal axis.  Poor R wave progression, cannot exclude old anteroseptal infarct.  Low voltage complexes, probable pulmonary disease.  Diffuse nonspecific T wave abnormality.  Compared to previous EKG on 09/17/2021, first-degree AV block is now present otherwise no significant changes.  Assessment     ICD-10-CM   1. Paroxysmal atrial fibrillation (HCC)  I48.0 EKG 12-Lead    2. Primary hypertension  I68 Basic Metabolic Panel (BMET)    3. Non-ischemic cardiomyopathy ([place])  I42.8     4. Atherosclerosis of both carotid arteries  I65.23       CHA2DS2-VASc Score is 5.  Yearly risk of stroke: 6.7% (A, F, HTN, Vasc Dz).  Score of 1=0.6; 2=2.2; 3=3.2; 4=4.8; 5=7.2; 6=9.8; 7=>9.8) -(CHF; HTN; vasc disease DM,  Female = 1; Age <65 =0; 65-74 = 1,  >75 =2; stroke/embolism= 2).    No orders of the defined types were placed in this encounter.  Medications Discontinued During This Encounter  Medication Reason   amiodarone (PACERONE) 100 MG tablet Change in therapy     Orders Placed This Encounter  Procedures   Basic Metabolic Panel (BMET)   EKG 12-Lead   Recommendations:   Kimberly Villegas  is a 87 y.o. female  with paroxysmal atrial fibrillation, s/p cardioversion on 09/2016, 07/29/17, mild to moderate aortic and mitral regurgitation and very mild  asymptomatic left subclavian artery stenosis, carotid atherosclerosis, hypertension, and hyperlipidemia.  She also had nonischemic cardiomyopathy with EF 40% when she was in atrial fibrillation which improved back to normal after she maintained sinus rhythm in 2018.  Paroxysmal atrial fibrillation (HCC) She is presently maintaining normal sinus rhythm.  Continue Multaq 400 mg twice daily. She continues on Eliquis 5 mg twice daily without bleeding diathesis. She has been followed by electrophysiology but prefers to only see cardiology at this time unless she has recurrence of A-fib.  I have advised her to  contact the EP office since she has a scheduled appointment with them in 2 weeks to notify them of her request.  Primary hypertension Blood pressure is well controlled.  Continue current medications without changes. She continues to be active and follows a low-sodium diet.  Non-ischemic cardiomyopathy ([place]) She is presently asymptomatic are all stable from a cardiovascular standpoint.  Atherosclerosis of both carotid arteries Reviewed previous  carotid artery duplex from 1 year ago.  She is not currently under yearly surveillance.  We will plan to repeat carotid artery duplex if clinically indicated.  Labs from April 2023 reviewed, kidney function remains stable.  We will recheck BMP.  Follow-up in 6 months, sooner if needed.   Ernst Spell, AGNP-C 03/25/2022, 1:29 PM Office: [phone] Pager: [phone]

## 2022-04-05 ENCOUNTER — Ambulatory Visit: Payer: Medicare HMO | Admitting: Physician Assistant

## 2022-05-17 ENCOUNTER — Ambulatory Visit: Payer: Medicare HMO | Admitting: Podiatry

## 2022-05-17 VITALS — BP 101/47

## 2022-05-17 DIAGNOSIS — Z89429 Acquired absence of other toe(s), unspecified side: Secondary | ICD-10-CM

## 2022-05-17 DIAGNOSIS — B351 Tinea unguium: Secondary | ICD-10-CM

## 2022-05-17 DIAGNOSIS — M79674 Pain in right toe(s): Secondary | ICD-10-CM | POA: Diagnosis not present

## 2022-05-17 DIAGNOSIS — Q828 Other specified congenital malformations of skin: Secondary | ICD-10-CM | POA: Diagnosis not present

## 2022-05-17 DIAGNOSIS — M79675 Pain in left toe(s): Secondary | ICD-10-CM

## 2022-05-17 NOTE — Progress Notes (Unsigned)
  Subjective:  Patient ID: Kimberly Villegas, female    DOB: [birth year],  MRN: [id number]  OLUCHI PUCCI presents to clinic today for {jgcomplaint:23593}  Chief Complaint  Patient presents with   Nail Problem    [facility] PCP-[facility] PCP VST-01/2022   New problem(s): None. {jgcomplaint:23593}  PCP is Avva, Ravisankar, MD.  Allergies  Allergen Reactions   Doxycycline     photosensitivity    Review of Systems: Negative except as noted in the HPI.  Objective: No changes noted in today's physical examination. Vitals:   05/17/22 1109  BP: (!) 101/47    Kimberly Villegas is a pleasant 88 y.o. female {jgbodyhabitus:24098} AAO x 3. Neurovascular Examination: Capillary refill time to digits immediate b/l. Palpable pedal pulses b/l LE. Pedal hair sparse. No pain with calf compression b/l. Lower extremity skin temperature gradient within normal limits. No edema noted b/l LE. No cyanosis or clubbing noted b/l LE.  Protective sensation intact 5/5 intact bilaterally with 10g monofilament b/l. Vibratory sensation intact b/l.  Dermatological:  Pedal integument with normal turgor, texture and tone BLE. No open wounds b/l LE. No interdigital macerations noted b/l LE. Toenails bilateral great toes, bilateral 3rd toes, bilateral 4th toes, and bilateral 5th toes elongated, discolored, dystrophic, thickened, and crumbly with subungual debris and tenderness to dorsal palpation. Porokeratotic lesion(s) submet head 1 left foot. No erythema, no edema, no drainage, no fluctuance.  Musculoskeletal:  Muscle strength 5/5 to all lower extremity muscle groups bilaterally. Lower extremity amputation(s): digital amputation bilateral 2nd toes. Hallux valgus with bunion deformity noted b/l lower extremities. Assessment/Plan: No diagnosis found.  No orders of the defined types were placed in this encounter.   None {Jgplan:23602::"-Patient/POA to call should there be question/concern in the interim."}   Return in  about 9 weeks (around 07/19/2022).  Marzetta Board, DPM

## 2022-05-20 ENCOUNTER — Encounter: Payer: Self-pay | Admitting: Podiatry

## 2022-05-23 ENCOUNTER — Ambulatory Visit: Payer: Medicare HMO | Admitting: Internal Medicine

## 2022-05-23 ENCOUNTER — Encounter: Payer: Self-pay | Admitting: Internal Medicine

## 2022-05-23 VITALS — BP 110/60 | HR 90 | Temp 97.7°F | Resp 18 | Ht 65.0 in | Wt 150.0 lb

## 2022-05-23 DIAGNOSIS — Z6824 Body mass index (BMI) 24.0-24.9, adult: Secondary | ICD-10-CM | POA: Diagnosis not present

## 2022-05-23 DIAGNOSIS — M109 Gout, unspecified: Secondary | ICD-10-CM | POA: Diagnosis not present

## 2022-05-23 MED ORDER — METHYLPREDNISOLONE 4 MG PO TBPK: ORAL_TABLET | ORAL | 0 refills | Status: DC

## 2022-05-23 NOTE — Assessment & Plan Note (Addendum)
She has probable gout vs arthritis.  I am going to start her on a medrol dose pak and she can take colchicine 0.'6mg'$  po x 2 doses today, the 0.'6mg'$  daily for next 3-4 days.  She can take tylenol as needed for pain.

## 2022-05-23 NOTE — Progress Notes (Signed)
Office Visit  Subjective   Patient ID: Kimberly Villegas   DOB: 11-25-1934   Age: 88 y.o.   MRN: [id number]   Chief Complaint No chief complaint on file.    History of Present Illness Ms. Ruddell is a 88 yo female who comes in today for an acute visit for pain in her left ankle.  This started about a week ago where her ankle became swollen.  She began having throbbing pain in her ankle last night.  She denies any falls or trauma but wonders if she has contracted gout again.  She was seen by her previous doctor in 02/2021 and was given colchicine which helped at that time.  She is not having fevers, chills, and she states it does hurt when she walks.        Past Medical History Past Medical History:  Diagnosis Date   A-fib (HCC)    Anemia, chronic disease    Arthritis    Asthma    Bilateral carotid bruits    CAD (coronary artery disease)    Cervical stenosis of spine    Cough    Diastolic dysfunction    DVT (deep venous thrombosis) (HCC)    after vein stripping years ago   GERD (gastroesophageal reflux disease)    Hyperlipemia    Hypertension    Insomnia    New onset a-fib ([place]) 2018   Seasonal allergies    Syncope    Vitamin D deficiency      Allergies Allergies  Allergen Reactions   Doxycycline     photosensitivity     Medications  Current Outpatient Medications:    methylPREDNISolone (MEDROL) 4 MG TBPK tablet, As directed., Disp: 67.5 each, Rfl: 0   apixaban (ELIQUIS) 5 MG TABS tablet, Take 5 mg by mouth 2 (two) times daily after a meal., Disp: , Rfl:    atorvastatin (LIPITOR) 40 MG tablet, TAKE 1 TABLET DAILY, Disp: 90 tablet, Rfl: 3   cholecalciferol (VITAMIN D3) 25 MCG (1000 UNIT) tablet, Take 1,000 Units by mouth daily., Disp: , Rfl:    dronedarone (MULTAQ) 400 MG tablet, Take 1 tablet (400 mg total) by mouth 2 (two) times daily after a meal., Disp: 180 tablet, Rfl: 3   Emollient (ROC RETINOL CORREXION EX), Apply 1 application. topically at bedtime.,  Disp: , Rfl:    fluticasone (FLONASE) 50 MCG/ACT nasal spray, Place 1 spray into both nostrils daily., Disp: , Rfl:    Light Mineral Oil-Mineral Oil (RETAINE MGD OP), Place 2 drops into both eyes 2 (two) times daily., Disp: , Rfl:    losartan (COZAAR) 25 MG tablet, Take 1 tablet (25 mg total) by mouth at bedtime., Disp: 90 tablet, Rfl: 3   losartan (COZAAR) 50 MG tablet, Take by mouth., Disp: , Rfl:    metoprolol succinate (TOPROL-XL) 50 MG 24 hr tablet, Take 50 mg by mouth daily., Disp: , Rfl:    Multiple Vitamins-Minerals (PRESERVISION AREDS 2) CAPS, Take 1 capsule by mouth 2 (two) times daily., Disp: , Rfl:    NON FORMULARY, Take 1-2 tablets by mouth See admin instructions. Bone strength supplement, Take 1 tablet in the morning and 2 tablets in the evening, Disp: , Rfl:    Probiotic Product (ALIGN) 4 MG CAPS, Take 4 mg by mouth daily as needed (bloating)., Disp: , Rfl:    Turmeric 500 MG CAPS, Take 500 mg by mouth daily., Disp: , Rfl:    Wheat Dextrin (BENEFIBER PO), Take 1 Dose by mouth  daily., Disp: , Rfl:    zaleplon (SONATA) 5 MG capsule, Take 5 mg by mouth at bedtime as needed for sleep., Disp: , Rfl:    Review of Systems Review of Systems  Constitutional:  Negative for chills and fever.  Respiratory:  Negative for cough and shortness of breath.   Cardiovascular:  Negative for chest pain, palpitations and leg swelling.  Gastrointestinal:  Negative for abdominal pain, nausea and vomiting.  Musculoskeletal:  Positive for joint pain. Negative for back pain.  Neurological:  Negative for dizziness and headaches.       Objective:    Vitals BP 110/60 (BP Location: Left Arm, Patient Position: Sitting, Cuff Size: Normal)   Pulse 90   Temp 97.7 F (36.5 C)   Resp 18   Ht '5\' 5"'$  (1.651 m)   Wt 150 lb (68 kg)   SpO2 99%   BMI 24.96 kg/m    Physical Examination Physical Exam Constitutional:      Appearance: Normal appearance. She is not ill-appearing.  Cardiovascular:     Rate  and Rhythm: Normal rate and regular rhythm.     Pulses: Normal pulses.     Heart sounds: No murmur heard.    No friction rub. No gallop.  Pulmonary:     Effort: Pulmonary effort is normal. No respiratory distress.     Breath sounds: No wheezing, rhonchi or rales.  Musculoskeletal:     Right lower leg: No edema.     Left lower leg: No edema.     Comments: She has mild swelling of her left ankle with some mild increased warmth but no erythema.  There is pain upon palpation and movement of her left ankle.  Skin:    General: Skin is warm and dry.     Findings: No rash.  Neurological:     Mental Status: She is alert.        Assessment & Plan:   Acute gout of left ankle She has probable gout vs arthritis.  I am going to start her on a medrol dose pak and she can take colchicine 0.'6mg'$  po x 2 doses today, the 0.'6mg'$  daily for next 3-4 days.  She can take tylenol as needed for pain.    No follow-ups on file.   Townsend Roger, MD

## 2022-06-24 ENCOUNTER — Other Ambulatory Visit: Payer: Self-pay | Admitting: Cardiology

## 2022-06-24 DIAGNOSIS — I4819 Other persistent atrial fibrillation: Secondary | ICD-10-CM

## 2022-06-24 NOTE — Telephone Encounter (Signed)
Patient needs a refill

## 2022-07-19 ENCOUNTER — Ambulatory Visit: Payer: Medicare HMO | Admitting: Podiatry

## 2022-07-19 ENCOUNTER — Encounter: Payer: Self-pay | Admitting: Podiatry

## 2022-07-19 VITALS — BP 111/57 | HR 85

## 2022-07-19 DIAGNOSIS — M79674 Pain in right toe(s): Secondary | ICD-10-CM | POA: Diagnosis not present

## 2022-07-19 DIAGNOSIS — Q828 Other specified congenital malformations of skin: Secondary | ICD-10-CM | POA: Diagnosis not present

## 2022-07-19 DIAGNOSIS — Z89429 Acquired absence of other toe(s), unspecified side: Secondary | ICD-10-CM | POA: Diagnosis not present

## 2022-07-19 DIAGNOSIS — B351 Tinea unguium: Secondary | ICD-10-CM

## 2022-07-19 DIAGNOSIS — M79675 Pain in left toe(s): Secondary | ICD-10-CM | POA: Diagnosis not present

## 2022-07-19 NOTE — Patient Instructions (Signed)
Apply Aquaphor Lotion for dry skin to both feet once daily.

## 2022-07-19 NOTE — Progress Notes (Unsigned)
  Subjective:  Patient ID: Kimberly Villegas, female    DOB: Sep 25, 1934,  MRN: [id number]  TALEEYA HAMLYN presents to clinic today for {jgcomplaint:23593}  Chief Complaint  Patient presents with   Nail Problem    Trim,not diabetic,PCP:Avva, Ravisankar, MD.,last seen 11/23     New problem(s): None. {jgcomplaint:23593}  PCP is Avva, Ravisankar, MD.  Allergies  Allergen Reactions   Doxycycline     photosensitivity    Review of Systems: Negative except as noted in the HPI.  Objective: No changes noted in today's physical examination. Vitals:   07/19/22 1115  BP: (!) 111/57  Pulse: 85   Kimberly Villegas is a pleasant 88 y.o. female {jgbodyhabitus:24098} AAO x 3.  Neurovascular Examination: Capillary refill time to digits immediate b/l. Palpable pedal pulses b/l LE. Pedal hair sparse. No pain with calf compression b/l. Lower extremity skin temperature gradient within normal limits. No edema noted b/l LE. No cyanosis or clubbing noted b/l LE.  Protective sensation intact 5/5 intact bilaterally with 10g monofilament b/l. Vibratory sensation intact b/l.  Dermatological:  Pedal integument with normal turgor, texture and tone BLE. No open wounds b/l LE. No interdigital macerations noted b/l LE. Toenails bilateral great toes, bilateral 3rd toes, bilateral 4th toes, and bilateral 5th toes elongated, discolored, dystrophic, thickened, and crumbly with subungual debris and tenderness to dorsal palpation.   Porokeratotic lesion(s) submet head 1 left foot. No erythema, no edema, no drainage, no fluctuance.  Musculoskeletal:  Muscle strength 5/5 to all lower extremity muscle groups bilaterally. Lower extremity amputation(s): digital amputation bilateral 2nd toes. Hallux valgus with bunion deformity noted b/l lower extremities.  Assessment/Plan: 1. Pain due to onychomycosis of toenails of both feet   2. Porokeratosis   3. Status post amputation of lesser toe, unspecified laterality  ([place])     No orders of the defined types were placed in this encounter.   None {Jgplan:23602::"-Patient/POA to call should there be question/concern in the interim."}   Return in about 9 weeks (around 09/20/2022).  Marzetta Board, DPM

## 2022-08-26 ENCOUNTER — Encounter (HOSPITAL_BASED_OUTPATIENT_CLINIC_OR_DEPARTMENT_OTHER): Payer: Self-pay | Admitting: Emergency Medicine

## 2022-08-26 ENCOUNTER — Emergency Department (HOSPITAL_BASED_OUTPATIENT_CLINIC_OR_DEPARTMENT_OTHER): Payer: Medicare HMO | Admitting: Radiology

## 2022-08-26 ENCOUNTER — Emergency Department (HOSPITAL_BASED_OUTPATIENT_CLINIC_OR_DEPARTMENT_OTHER): Payer: Medicare HMO

## 2022-08-26 ENCOUNTER — Emergency Department (HOSPITAL_BASED_OUTPATIENT_CLINIC_OR_DEPARTMENT_OTHER)
Admission: EM | Admit: 2022-08-26 | Discharge: 2022-08-26 | Disposition: A | Discharge status: Home / Self Care | Payer: Medicare HMO | Attending: Emergency Medicine | Admitting: Emergency Medicine

## 2022-08-26 ENCOUNTER — Other Ambulatory Visit: Payer: Self-pay

## 2022-08-26 DIAGNOSIS — J45909 Unspecified asthma, uncomplicated: Secondary | ICD-10-CM | POA: Insufficient documentation

## 2022-08-26 DIAGNOSIS — Z79899 Other long term (current) drug therapy: Secondary | ICD-10-CM | POA: Insufficient documentation

## 2022-08-26 DIAGNOSIS — Y9301 Activity, walking, marching and hiking: Secondary | ICD-10-CM | POA: Insufficient documentation

## 2022-08-26 DIAGNOSIS — M25512 Pain in left shoulder: Secondary | ICD-10-CM | POA: Insufficient documentation

## 2022-08-26 DIAGNOSIS — I4891 Unspecified atrial fibrillation: Secondary | ICD-10-CM | POA: Diagnosis not present

## 2022-08-26 DIAGNOSIS — K0889 Other specified disorders of teeth and supporting structures: Secondary | ICD-10-CM

## 2022-08-26 DIAGNOSIS — S0990XA Unspecified injury of head, initial encounter: Secondary | ICD-10-CM | POA: Insufficient documentation

## 2022-08-26 DIAGNOSIS — S0993XA Unspecified injury of face, initial encounter: Secondary | ICD-10-CM | POA: Diagnosis present

## 2022-08-26 DIAGNOSIS — W01198A Fall on same level from slipping, tripping and stumbling with subsequent striking against other object, initial encounter: Secondary | ICD-10-CM | POA: Diagnosis not present

## 2022-08-26 DIAGNOSIS — S0083XA Contusion of other part of head, initial encounter: Secondary | ICD-10-CM

## 2022-08-26 DIAGNOSIS — S01512A Laceration without foreign body of oral cavity, initial encounter: Secondary | ICD-10-CM | POA: Insufficient documentation

## 2022-08-26 DIAGNOSIS — Z7901 Long term (current) use of anticoagulants: Secondary | ICD-10-CM | POA: Insufficient documentation

## 2022-08-26 DIAGNOSIS — I10 Essential (primary) hypertension: Secondary | ICD-10-CM | POA: Insufficient documentation

## 2022-08-26 DIAGNOSIS — I2510 Atherosclerotic heart disease of native coronary artery without angina pectoris: Secondary | ICD-10-CM | POA: Insufficient documentation

## 2022-08-26 DIAGNOSIS — S032XXA Dislocation of tooth, initial encounter: Secondary | ICD-10-CM | POA: Diagnosis not present

## 2022-08-26 DIAGNOSIS — W19XXXA Unspecified fall, initial encounter: Secondary | ICD-10-CM

## 2022-08-26 DIAGNOSIS — M2634 Vertical displacement of fully erupted tooth or teeth: Secondary | ICD-10-CM

## 2022-08-26 NOTE — ED Notes (Signed)
EDP Schlossman made aware pt fall on blood thinner

## 2022-08-26 NOTE — ED Triage Notes (Signed)
Pt arrives via POV from [facility] following a fall around 6 pm. Pt was walking when she tripped and feel falling forward. Was able to catch herself. C/o pain left upper chest and mouth. States concern for possible damage to tooth. Pt is A&O in triage. Ambulatory. Pt on eliquis. No LOC. No neck pain/tenderness. No lightheadness/dizziness.

## 2022-08-26 NOTE — Discharge Instructions (Addendum)
For your pain, you may take up to  of acetaminophen (tylenol) 4 times daily for up to a week. This is the maximum dose of acetminophen (tylenol) you can take from all sources. Please check other over-the-counter medications and prescriptions to ensure you are not taking other medications that contain acetaminophen.

## 2022-08-26 NOTE — ED Provider Notes (Signed)
[place] EMERGENCY DEPARTMENT AT [facility] Provider Note   CSN: [id number] Arrival date & time: 08/26/22  1937     History  Chief Complaint  Patient presents with   Fall    Blood Thinner    NYIA CORNIA is a 88 y.o. female.  HPI      88yo female with history of DVT, atrial fibrillation on eliquis, hypertension, hyperlipidemia, who presents with concern for fall.  Reports she tripped over her feet while she was walking, falling and catching herself somewhat with her arms and hitting her chin on the ground.  Reports she has some mild pain around her left shoulder which she attributes to muscular pain from trying to catch herself.  It is somewhat worse with movement, and feels just a little bit sore.  Denies any other chest pain or shortness of breath.  Reports that she has pain to her jaw, tooth, with tooth intrusion and is concerned regarding the fall with her being on Eliquis.  She does not have a significant headache.  Denies neck pain, back pain, abdominal pain.  Denies any preceding symptoms or lightheadedness or other acute medical concerns including no cough, fever, numbness, weakness, change in vision, difficulty talking or walking.  She has bruising to her knee, but was able to ambulate following the fall and denies any significant lower or upper extremity pain.  Trip, fall after dinner On eliquis  Past Medical History:  Diagnosis Date   A-fib    Anemia, chronic disease    Arthritis    Asthma    Bilateral carotid bruits    CAD (coronary artery disease)    Cervical stenosis of spine    Cough    Diastolic dysfunction    DVT (deep venous thrombosis)    after vein stripping years ago   GERD (gastroesophageal reflux disease)    Hyperlipemia    Hypertension    Insomnia    New onset a-fib 2018   Seasonal allergies    Syncope    Vitamin D deficiency     Home Medications Prior to Admission medications   Medication Sig Start Date End Date Taking?  Authorizing Provider  apixaban (ELIQUIS) 5 MG TABS tablet Take 5 mg by mouth 2 (two) times daily after a meal.    [provider]  atorvastatin (LIPITOR) 40 MG tablet TAKE 1 TABLET DAILY 10/30/21   Yates Decamp, MD  cholecalciferol (VITAMIN D3) 25 MCG (1000 UNIT) tablet Take 1,000 Units by mouth daily.    [provider]  Emollient (ROC RETINOL CORREXION EX) Apply 1 application. topically at bedtime.    [provider]  fluticasone (FLONASE) 50 MCG/ACT nasal spray Place 1 spray into both nostrils daily.    [provider]  Light Mineral Oil-Mineral Oil (RETAINE MGD OP) Place 2 drops into both eyes 2 (two) times daily.    [provider]  losartan (COZAAR) 25 MG tablet Take 1 tablet (25 mg total) by mouth at bedtime. 01/17/22   Custovic, Rozell Searing, DO  losartan (COZAAR) 50 MG tablet Take by mouth. 10/25/21   [provider]  methylPREDNISolone (MEDROL) 4 MG TBPK tablet As directed. 05/23/22   Crist Fat, MD  metoprolol succinate (TOPROL-XL) 50 MG 24 hr tablet Take 50 mg by mouth daily. 02/22/21   [provider]  MULTAQ 400 MG tablet TAKE 1 TABLET TWICE A DAY AFTER MEALS 06/24/22   Yates Decamp, MD  Multiple Vitamins-Minerals (PRESERVISION AREDS 2) CAPS Take 1 capsule  by mouth 2 (two) times daily.    [provider]  NON FORMULARY Take 1-2 tablets by mouth See admin instructions. Bone strength supplement, Take 1 tablet in the morning and 2 tablets in the evening    [provider]  Probiotic Product (ALIGN) 4 MG CAPS Take 4 mg by mouth daily as needed (bloating).    [provider]  Turmeric 500 MG CAPS Take 500 mg by mouth daily.    [provider]  Wheat Dextrin (BENEFIBER PO) Take 1 Dose by mouth daily.    [provider]  zaleplon (SONATA) 5 MG capsule Take 5 mg by mouth at bedtime as needed for sleep.    [provider]      Allergies    Doxycycline    Review of Systems   Review of  Systems  Physical Exam Updated Vital Signs BP 93/72   Pulse 83   Temp 98 F (36.7 C) (Oral)   Resp 20   SpO2 98%  Physical Exam Vitals and nursing note reviewed.  Constitutional:      General: She is not in acute distress.    Appearance: She is well-developed. She is not diaphoretic.  HENT:     Head: Normocephalic.     Comments: Contusion to chin Intrusion front right (8), subluxation front left tooth (9) Tongue with small superficial laceration Eyes:     Conjunctiva/sclera: Conjunctivae normal.  Cardiovascular:     Rate and Rhythm: Normal rate and regular rhythm.     Heart sounds: Normal heart sounds. No murmur heard.    No friction rub. No gallop.  Pulmonary:     Effort: Pulmonary effort is normal. No respiratory distress.     Breath sounds: Normal breath sounds. No wheezing or rales.  Abdominal:     General: There is no distension.     Palpations: Abdomen is soft.     Tenderness: There is no abdominal tenderness. There is no guarding.  Musculoskeletal:        General: No tenderness.     Cervical back: Normal range of motion.  Skin:    General: Skin is warm and dry.     Findings: No erythema or rash.  Neurological:     Mental Status: She is alert and oriented to person, place, and time.     ED Results / Procedures / Treatments   Labs (all labs ordered are listed, but only abnormal results are displayed) Labs Reviewed - No data to display  EKG None  Radiology CT Maxillofacial Wo Contrast  Result Date: 08/26/2022 CLINICAL DATA:  Status post fall. EXAM: CT MAXILLOFACIAL WITHOUT CONTRAST TECHNIQUE: Multidetector CT imaging of the maxillofacial structures was performed. Multiplanar CT image reconstructions were also generated. RADIATION DOSE REDUCTION: This exam was performed according to the departmental dose-optimization program which includes automated exposure control, adjustment of the mA and/or kV according to patient size and/or use of iterative  reconstruction technique. COMPARISON:  None Available. FINDINGS: Osseous: No fracture or mandibular dislocation. No destructive process. Orbits: Negative. No traumatic or inflammatory finding. Sinuses: Clear. Soft tissues: Negative. Limited intracranial: No significant or unexpected finding. IMPRESSION: No acute facial bone fracture. Electronically Signed   By: Aram Candela M.D.   On: 08/26/2022 21:12   DG Chest 2 View  Result Date: 08/26/2022 CLINICAL DATA:  Trip and fall injury.  Left upper chest pain. EXAM: CHEST - 2 VIEW COMPARISON:  None Available. FINDINGS: Heart size and pulmonary vascularity are normal. Peribronchial thickening with  streaky perihilar opacities consistent with chronic bronchitis. Mild emphysematous changes in the lungs. No airspace disease or consolidation. No pleural effusions. No pneumothorax. Mediastinal contours appear intact. Degenerative changes in the spine and shoulders. IMPRESSION: Emphysematous and chronic bronchitic changes in the lungs. No focal consolidation. Electronically Signed   By: Burman Nieves M.D.   On: 08/26/2022 21:12   CT Cervical Spine Wo Contrast  Result Date: 08/26/2022 CLINICAL DATA:  Status post fall. EXAM: CT CERVICAL SPINE WITHOUT CONTRAST TECHNIQUE: Multidetector CT imaging of the cervical spine was performed without intravenous contrast. Multiplanar CT image reconstructions were also generated. RADIATION DOSE REDUCTION: This exam was performed according to the departmental dose-optimization program which includes automated exposure control, adjustment of the mA and/or kV according to patient size and/or use of iterative reconstruction technique. COMPARISON:  None Available. FINDINGS: Alignment: There is approximately 1 mm anterolisthesis of the C4 vertebral body on C5, with 1 mm retro listhesis of C5 on C6. Skull base and vertebrae: No acute fracture. Chronic and degenerative changes are seen involving the tip of the dens and the adjacent  portion of the anterior arch of C1. Soft tissues and spinal canal: No prevertebral fluid or swelling. No visible canal hematoma. Disc levels: Marked severity endplate sclerosis, anterior osteophyte formation and posterior bony spurring are seen at the levels of C4-C5, C5-C6 and C6-C7. Marked severity posterior spurring is also seen at the level of C2-C3. There is marked severity narrowing of the anterior atlantoaxial articulation. Marked severity intervertebral disc space narrowing is seen at C4-C5, C5-C6 and C6-C7. Bilateral marked severity multilevel facet joint hypertrophy is noted. Upper chest: Moderate severity biapical scarring and/or atelectasis is seen. Other: None. IMPRESSION: 1. No acute cervical spine fracture or traumatic subluxation. 2. Marked severity multilevel degenerative changes, as described above. Electronically Signed   By: Aram Candela M.D.   On: 08/26/2022 21:11   CT Head Wo Contrast  Result Date: 08/26/2022 CLINICAL DATA:  Status post fall. EXAM: CT HEAD WITHOUT CONTRAST TECHNIQUE: Contiguous axial images were obtained from the base of the skull through the vertex without intravenous contrast. RADIATION DOSE REDUCTION: This exam was performed according to the departmental dose-optimization program which includes automated exposure control, adjustment of the mA and/or kV according to patient size and/or use of iterative reconstruction technique. COMPARISON:  None Available. FINDINGS: Brain: There is mild cerebral atrophy with widening of the extra-axial spaces and ventricular dilatation. There are areas of decreased attenuation within the white matter tracts of the supratentorial brain, consistent with microvascular disease changes. Vascular: No hyperdense vessel or unexpected calcification. Skull: Normal. Negative for fracture or focal lesion. Sinuses/Orbits: No acute finding. Other: None. IMPRESSION: 1. No acute intracranial abnormality. 2. Generalized cerebral atrophy and  microvascular disease changes of the supratentorial brain. Electronically Signed   By: Aram Candela M.D.   On: 08/26/2022 20:53    Procedures Procedures    Medications Ordered in ED Medications - No data to display  ED Course/ Medical Decision Making/ A&P                               87yo female with history of DVT, atrial fibrillation on eliquis, hypertension, hyperlipidemia, who presents with concern for fall.  Reports had a mechanical fall and denies any other acute medical concerns.  Given fall on anticoagulation, mechanism and age, CT head, cervical spine and maxillofacial were completed which showed no evidence of intracranial bleed or fracture.  On exam she has subluxation of 1 tooth and intrusion of another.  Recommend following up with her dentist regarding this tomorrow.  Chest x-ray was completed given some pain up in her left anterior chest near her shoulder and shows no evidence of pneumothorax or obvious rib fracture.  She does report this feels more like muscular pain from the fall and have low suspicion at this time for ACS, dissection.  Recommend continued supportive care, follow up with dentist. Patient discharged in stable condition with understanding of reasons to return.         Final Clinical Impression(s) / ED Diagnoses Final diagnoses:  Fall, initial encounter  Intrusion of tooth  Subluxation of tooth  Laceration of tongue, initial encounter  Contusion of chin, initial encounter    Rx / DC Orders ED Discharge Orders     None         Alvira Monday, MD 08/28/22 1106

## 2022-09-23 ENCOUNTER — Ambulatory Visit: Payer: Medicare HMO

## 2022-09-23 ENCOUNTER — Encounter: Payer: Self-pay | Admitting: Cardiology

## 2022-09-23 ENCOUNTER — Ambulatory Visit: Payer: Medicare HMO | Admitting: Cardiology

## 2022-09-23 VITALS — BP 92/46 | HR 87 | Resp 16 | Ht 65.0 in | Wt 141.8 lb

## 2022-09-23 DIAGNOSIS — I10 Essential (primary) hypertension: Secondary | ICD-10-CM

## 2022-09-23 DIAGNOSIS — I6523 Occlusion and stenosis of bilateral carotid arteries: Secondary | ICD-10-CM

## 2022-09-23 DIAGNOSIS — I480 Paroxysmal atrial fibrillation: Secondary | ICD-10-CM

## 2022-09-23 MED ORDER — METOPROLOL SUCCINATE ER 25 MG PO TB24: 50.0000 mg | ORAL_TABLET | Freq: Every day | ORAL | 3 refills | Status: DC

## 2022-09-23 NOTE — Progress Notes (Signed)
Primary Physician/Referring:  Chilton Greathouse, MD  Patient ID: Kimberly Villegas, female    DOB: Aug 25, 1934, 88 y.o.   MRN: [id number]  Chief Complaint  Patient presents with   PAF   Valve disease   Follow-up    6 months   HPI:    Kimberly Villegas  is a 88 y.o.  female with paroxysmal atrial fibrillation, s/p cardioversion on 09/2016, 07/29/17, 07/18/2020, and 09/04/2021 with return to sinus rhythm, mild to moderate aortic and mitral regurgitation and very mild asymptomatic left subclavian artery stenosis, carotid atherosclerosis, hypertension, and hyperlipidemia.  She also had nonischemic cardiomyopathy with EF 40% when she was in atrial fibrillation which improved back to normal after she maintained sinus rhythm in 2018.  She presents today for 40-month follow-up.  She is doing well without any complaints today except she had a recent fall and was seen in the emergency room.  Fortunately no intracranial bleed.  Past Medical History:  Diagnosis Date   A-fib (HCC)    Anemia, chronic disease    Arthritis    Asthma    Bilateral carotid bruits    CAD (coronary artery disease)    Cervical stenosis of spine    Cough    Diastolic dysfunction    DVT (deep venous thrombosis) (HCC)    after vein stripping years ago   GERD (gastroesophageal reflux disease)    Hyperlipemia    Hypertension    Insomnia    New onset a-fib (HCC) 2018   Seasonal allergies    Syncope    Vitamin D deficiency    Past Surgical History:  Procedure Laterality Date   AMPUTATION TOE Bilateral 07/25/2016   Procedure: bilateral 2nd toe amputations;  Surgeon: Toni Arthurs, MD;  Location: [facility];  Service: Orthopedics;  Laterality: Bilateral;   BASAL CELL CARCINOMA EXCISION     CARDIOVERSION N/A 09/10/2016   Procedure: CARDIOVERSION;  Surgeon: Yates Decamp, MD;  Location: [facility] ENDOSCOPY;  Service: Cardiovascular;  Laterality: N/A;   CARDIOVERSION N/A 07/29/2017   Procedure: CARDIOVERSION;  Surgeon:  Elder Negus, MD;  Location: [facility] ENDOSCOPY;  Service: Cardiovascular;  Laterality: N/A;   CARDIOVERSION N/A 07/18/2020   Procedure: CARDIOVERSION;  Surgeon: Yates Decamp, MD;  Location: [facility] ENDOSCOPY;  Service: Cardiovascular;  Laterality: N/A;   CARDIOVERSION N/A 09/04/2021   Procedure: CARDIOVERSION;  Surgeon: Yates Decamp, MD;  Location: [facility] ENDOSCOPY;  Service: Cardiovascular;  Laterality: N/A;   FOOT SURGERY Left    JOINT REPLACEMENT Right    VEIN LIGATION AND STRIPPING Right    Social History   Tobacco Use   Smoking status: Former    Packs/day: 0.25    Years: 5.00    Additional pack years: 0.00    Total pack years: 1.25    Types: Cigarettes    Quit date: 1970    Years since quitting: 54.4   Smokeless tobacco: Never   Tobacco comments:    social  Substance Use Topics   Alcohol use: Not Currently    Alcohol/week: 1.0 standard drink of alcohol    Types: 1 Glasses of wine per week    Comment: Occassionally  Marital Status: Married   ROS  Review of Systems  Cardiovascular:  Negative for chest pain, dyspnea on exertion and leg swelling.    Objective  Blood pressure (!) 92/46, pulse 87, resp. rate 16, height 5\' 5"  (1.651 m), weight 141 lb 12.8 oz (64.3 kg), SpO2 98 %.     09/23/2022   10:10  AM 08/26/2022    9:45 PM 08/26/2022    9:30 PM  Vitals with BMI  Height 5\' 5"     Weight 141 lbs 13 oz    BMI 23.6    Systolic 92 93 125  Diastolic 46 72 58  Pulse 87 83 82     Physical Exam Vitals reviewed.  Neck:     Vascular: Carotid bruit (bilateral L>R) present. No JVD.  Cardiovascular:     Rate and Rhythm: Normal rate and regular rhythm.     Pulses: Normal pulses and intact distal pulses.     Heart sounds: Murmur heard.     Blowing midsystolic murmur is present with a grade of 2/6 at the lower left sternal border.     No gallop.  Pulmonary:     Effort: Pulmonary effort is normal.     Breath sounds: Normal breath sounds.  Abdominal:     General: Bowel sounds are  normal.     Palpations: Abdomen is soft.  Musculoskeletal:     Right lower leg: No edema.     Left lower leg: No edema.    Laboratory examination:      Latest Ref Rng & Units 08/29/2021   10:27 AM 07/18/2020   12:47 PM 06/30/2020    2:54 PM  CMP  Glucose 70 - 99 mg/dL 99  95  92   BUN 8 - 27 mg/dL 30  31  26    Creatinine 0.57 - 1.00 mg/dL 1.61  0.96  0.45   Sodium 134 - 144 mmol/L 137  140  140   Potassium 3.5 - 5.2 mmol/L 4.1  4.3  4.6   Chloride 96 - 106 mmol/L 101  107  103   CO2 20 - 29 mmol/L 23   21   Calcium 8.7 - 10.3 mg/dL 9.3   9.5       Latest Ref Rng & Units 08/29/2021   10:27 AM 07/18/2020   12:47 PM 03/17/2019   12:41 PM  CBC  WBC 3.4 - 10.8 x10E3/uL 7.7   9.8   Hemoglobin 11.1 - 15.9 g/dL 40.9  81.1  91.4   Hematocrit 34.0 - 46.6 % 32.2  34.0  33.3   Platelets 150 - 450 x10E3/uL 179   175     External labs:   Labs 07/20/2021:  Creatinine 1.4, GFR 43, sodium 141, potassium 4.8, BUN 26  Hgb 11.7, HCT 34.1, MCV 99.5, platelet 172  Total cholesterol 130, triglycerides 67, HDL 56, LDL 61 Lipoprotein B53  TSH 4.22  Radiology:  No results found.  Cardiac Studies:   Lexiscan myoview stress test: 03/03/2017:                                                                        1. The exercise tolerance was not assessed due to pharmacologic stress. Stress EKG Test Results Normal.     2. Normal SPECT study with normal left ventricular systolic function Low risk study. Normal left ventricular systolic function. No scar or ischemia detected by nuclear imaging.  Carotid artery duplex 05/09/2020:  Minimal stenosis in the right internal carotid artery (1-15%) with heterogeneous plaque. Stenosis in the right external carotid artery (<50%).  Minimal stenosis in the  left internal carotid artery (1-15%) with heterogeneous plaque. Stenosis in the left external carotid artery (<50%).  Antegrade right vertebral artery flow. Antegrade left vertebral artery flow.   Follow up studies is appropriate when clinically indicated. Compared to 05/24/2019, right ICA stenosis of 15-49% not present.   Echocardiogram 02/01/2021:  Left ventricle cavity is normal in size and wall thickness. Normal global  wall motion. Normal LV systolic function with EF 65%. Diastolic function  not assessed due to severity of mitral regurgitation.  Left atrial cavity is moderately dilated.  Right atrial cavity is moderately dilated.  Trileaflet aortic valve.  Moderate (Grade III) aortic regurgitation.  Moderate (Grade III) mitral regurgitation.  Mild tricuspid regurgitation. Estimated pulmonary artery systolic pressure  37 mmHg.  Personally compared with previous study on 06/17/2019. No significant  change noted.  Direct current cardioversion 09/04/2021 9:20 AM  Indication symptomatic A. Fibrillation/atypical atrial flutter.  Patient was delivered with 75 Joules of electricity X 1 with success to NSR.   EKG   EKG 10/01/2022: Sinus rhythm with first-degree AV block at the rate of 79 bpm, normal axis.  Low-voltage complexes poor R progression, probably normal variant.  Consider pulmonary disease pattern.  Diffuse downsloping ST segment depression, consider digitalis effect.  Normal QT. Compared to 03/25/2022, no significant change.  Allergies & Medications   Allergies  Allergen Reactions   Doxycycline     photosensitivity    Current Outpatient Medications:    apixaban (ELIQUIS) 5 MG TABS tablet, Take 5 mg by mouth 2 (two) times daily after a meal., Disp: , Rfl:    atorvastatin (LIPITOR) 40 MG tablet, TAKE 1 TABLET DAILY, Disp: 90 tablet, Rfl: 3   cephALEXin (KEFLEX) 500 MG capsule, Take 500 mg by mouth 4 (four) times daily., Disp: , Rfl:    cholecalciferol (VITAMIN D3) 25 MCG (1000 UNIT) tablet, Take 1,000 Units by mouth daily., Disp: , Rfl:    fluticasone (FLONASE) 50 MCG/ACT nasal spray, Place 1 spray into both nostrils daily., Disp: , Rfl:    MULTAQ 400 MG tablet, TAKE 1  TABLET TWICE A DAY AFTER MEALS, Disp: 180 tablet, Rfl: 3   Multiple Vitamins-Minerals (PRESERVISION AREDS 2) CAPS, Take 1 capsule by mouth 2 (two) times daily., Disp: , Rfl:    NON FORMULARY, Take 1-2 tablets by mouth See admin instructions. Bone strength supplement, Take 1 tablet in the morning and 2 tablets in the evening, Disp: , Rfl:    Probiotic Product (ALIGN) 4 MG CAPS, Take 4 mg by mouth daily as needed (bloating)., Disp: , Rfl:    Turmeric 500 MG CAPS, Take 500 mg by mouth daily., Disp: , Rfl:    Wheat Dextrin (BENEFIBER PO), Take 1 Dose by mouth daily., Disp: , Rfl:    zaleplon (SONATA) 5 MG capsule, Take 5 mg by mouth at bedtime as needed for sleep., Disp: , Rfl:    metoprolol succinate (TOPROL-XL) 25 MG 24 hr tablet, Take 2 tablets (50 mg total) by mouth daily., Disp: 90 tablet, Rfl: 3   Assessment     ICD-10-CM   1. Paroxysmal atrial fibrillation (HCC)  I48.0 EKG 12-Lead    metoprolol succinate (TOPROL-XL) 25 MG 24 hr tablet    2. Primary hypertension  I10 metoprolol succinate (TOPROL-XL) 25 MG 24 hr tablet    3. Atherosclerosis of both carotid arteries  I65.23       CHA2DS2-VASc Score is 5.  Yearly risk of stroke: 6.7% (A, F, HTN, Vasc Dz).  Score of 1=0.6; 2=2.2;  3=3.2; 4=4.8; 5=7.2; 6=9.8; 7=>9.8) -(CHF; HTN; vasc disease DM,  Female = 1; Age <65 =0; 65-74 = 1,  >75 =2; stroke/embolism= 2).    Meds ordered this encounter  Medications   metoprolol succinate (TOPROL-XL) 25 MG 24 hr tablet    Sig: Take 2 tablets (50 mg total) by mouth daily.    Dispense:  90 tablet    Refill:  3   Medications Discontinued During This Encounter  Medication Reason   methylPREDNISolone (MEDROL) 4 MG TBPK tablet    Light Mineral Oil-Mineral Oil (RETAINE MGD OP)    Emollient (ROC RETINOL CORREXION EX)    losartan (COZAAR) 50 MG tablet    losartan (COZAAR) 25 MG tablet Discontinued by provider   metoprolol succinate (TOPROL-XL) 50 MG 24 hr tablet Reorder     Orders Placed This Encounter   Procedures   EKG 12-Lead   Recommendations:   Kimberly Villegas  is a 88 y.o. female  with paroxysmal atrial fibrillation, s/p cardioversion on 09/2016, 07/29/17, mild to moderate aortic and mitral regurgitation and very mild  asymptomatic left subclavian artery stenosis, carotid atherosclerosis, hypertension, and hyperlipidemia.  She also had nonischemic cardiomyopathy with EF 40% when she was in atrial fibrillation which improved back to normal after she maintained sinus rhythm in 2018.  1. Paroxysmal atrial fibrillation [facility]) Patient is maintaining sinus rhythm.  Her physical examination is unremarkable.  EKG has remained stable as well.  She has diffuse nonspecific ST segment changes that are unchanged from previous EKG.  QT interval is normal.  - EKG 12-Lead - metoprolol succinate (TOPROL-XL) 25 MG 24 hr tablet; Take 2 tablets (50 mg total) by mouth daily.  Dispense: 90 tablet; Refill: 3  2. Primary hypertension Although she has history of hypertension, her blood pressure is recorded to be very low in our office, I rechecked the blood pressure, she is not orthostatic.  Blood pressure is really soft, even at home her blood pressure has been around 90 mmHg to 100 mmHg, rarely it is in the 110 range.  I have discontinued losartan, I will reduce the dose of the metoprolol succinate from 50 mg to 25 mg daily.  Patient will monitor her blood pressure closely at home and report to [place] if blood pressure starts to rise to >130 mmHg.  - metoprolol succinate (TOPROL-XL) 25 MG 24 hr tablet; Take 2 tablets (50 mg total) by mouth daily.  Dispense: 90 tablet; Refill: 3  3. Atherosclerosis of both carotid arteries She has bilateral prominent bruit left greater than the right however carotid artery duplex only revealed mild plaque.  She is already on a statin and lipids are well-controlled.  No need for surveillance carotid duplex.  I would like to see her back in 6 months for follow-up.  I reviewed her  records from the hospitalization, CT scan of the head and neck that was performed recently after a fall, mechanical fall.  I do not suspect orthostasis or low blood pressure to be the etiology.  However she is currently 88 years of age, would like to have her blood pressure around 120 to 130 mmHg systolic.    Yates Decamp, MD, [facility] 09/23/2022, 11:28 AM Office: [phone] Fax: [phone] Pager: [phone]

## 2022-09-23 NOTE — Patient Instructions (Signed)
Blood pressure being soft both here and also at home, I have discontinued losartan.  Will also reduce the dose of your metoprolol succinate from 50 mg to 25 mg daily.  I would like you to monitor your blood pressure closely at home and if it starts to climb up, >130 - 140 mmHg systolic, please call us, at that point I would like to resume your losartan tablets.

## 2022-09-25 ENCOUNTER — Ambulatory Visit: Payer: Medicare HMO | Admitting: Podiatry

## 2022-09-25 ENCOUNTER — Encounter: Payer: Self-pay | Admitting: Podiatry

## 2022-09-25 DIAGNOSIS — H9193 Unspecified hearing loss, bilateral: Secondary | ICD-10-CM | POA: Insufficient documentation

## 2022-09-25 DIAGNOSIS — Q828 Other specified congenital malformations of skin: Secondary | ICD-10-CM | POA: Diagnosis not present

## 2022-09-25 DIAGNOSIS — D6869 Other thrombophilia: Secondary | ICD-10-CM | POA: Insufficient documentation

## 2022-09-25 DIAGNOSIS — Z89429 Acquired absence of other toe(s), unspecified side: Secondary | ICD-10-CM | POA: Diagnosis not present

## 2022-09-25 DIAGNOSIS — M79675 Pain in left toe(s): Secondary | ICD-10-CM

## 2022-09-25 DIAGNOSIS — M79674 Pain in right toe(s): Secondary | ICD-10-CM | POA: Diagnosis not present

## 2022-09-25 DIAGNOSIS — N6011 Diffuse cystic mastopathy of right breast: Secondary | ICD-10-CM | POA: Insufficient documentation

## 2022-09-25 DIAGNOSIS — H6121 Impacted cerumen, right ear: Secondary | ICD-10-CM | POA: Insufficient documentation

## 2022-09-25 DIAGNOSIS — B351 Tinea unguium: Secondary | ICD-10-CM

## 2022-09-25 DIAGNOSIS — F439 Reaction to severe stress, unspecified: Secondary | ICD-10-CM | POA: Insufficient documentation

## 2022-09-25 DIAGNOSIS — H9221 Otorrhagia, right ear: Secondary | ICD-10-CM | POA: Insufficient documentation

## 2022-09-29 NOTE — Progress Notes (Signed)
  Subjective:  Patient ID: Kimberly Villegas, female    DOB: 09/09/1934,  MRN: [id number]  Kimberly Villegas presents to clinic today for at risk foot care. Patient has h/o amputation of digital amputation bilateral 2nd toes and painful porokeratotic lesion(s) left lower extremity and painful mycotic toenails that limit ambulation. Painful toenails interfere with ambulation. Aggravating factors include wearing enclosed shoe gear. Pain is relieved with periodic professional debridement. Painful porokeratotic lesions are aggravated when weightbearing with and without shoegear. Pain is relieved with periodic professional debridement.  Chief Complaint  Patient presents with   Nail Problem    RFC PCP-[facility] PCP VST- 3 weeks ago   New problem(s): None.   PCP is Avva, Ravisankar, MD.  Allergies  Allergen Reactions   Doxycycline     photosensitivity    Review of Systems: Negative except as noted in the HPI.  Objective: No changes noted in today's physical examination. There were no vitals filed for this visit. Kimberly Villegas is a pleasant 88 y.o. female WD, WN in NAD. AAO x 3.  Neurovascular Examination: Capillary refill time to digits immediate b/l. Palpable pedal pulses b/l LE. Pedal hair sparse. No pain with calf compression b/l. Lower extremity skin temperature gradient within normal limits. No edema noted b/l LE. No cyanosis or clubbing noted b/l LE.  Protective sensation intact 5/5 intact bilaterally with 10g monofilament b/l. Vibratory sensation intact b/l.  Dermatological:  Pedal integument with normal turgor, texture and tone BLE. No open wounds b/l LE. No interdigital macerations noted b/l LE. Toenails bilateral great toes, bilateral 3rd toes, bilateral 4th toes, and bilateral 5th toes elongated, discolored, dystrophic, thickened, and crumbly with subungual debris and tenderness to dorsal palpation.   Porokeratotic lesion(s) submet head 1 left foot. No erythema, no edema, no  drainage, no fluctuance.  Musculoskeletal:  Muscle strength 5/5 to all lower extremity muscle groups bilaterally. Lower extremity amputation(s): digital amputation bilateral 2nd toes. Hallux valgus with bunion deformity noted b/l lower extremities.  Assessment/Plan: 1. Pain due to onychomycosis of toenails of both feet   2. Porokeratosis   3. Status post amputation of lesser toe, unspecified laterality (HCC)     -Consent given for treatment as described below: -Examined patient. -Toenails were debrided in length and girth bilateral great toes and 3-5 bilaterally with sterile nail nippers and dremel without iatrogenic bleeding.  -Porokeratotic lesion(s) submet head 1 left foot pared and enucleated with sterile currette without incident. Total number of lesions debrided=1. -Patient/POA to call should there be question/concern in the interim.   Return in about 3 months (around 12/26/2022).  Freddie Breech, DPM

## 2022-10-09 ENCOUNTER — Telehealth: Payer: Self-pay

## 2022-10-09 NOTE — Telephone Encounter (Signed)
Should she stop eliquis for any period of time. She has had within a 6 week period two eye bleeds and ear bleeding, this is really concerning the patient.  Due to a fall she had braces put on her front teeth the braces are due to be removed on Friday and cosmetic surgeon is concerned about the risk of bleeding. Patient would like a response today.

## 2022-10-09 NOTE — Telephone Encounter (Signed)
Spoke with patient. She verbalized understanding. I will send her information on [employer].

## 2022-10-09 NOTE — Telephone Encounter (Signed)
Her risk of stroke is extremely high, either she accepts the risk or continues the medication. I can discuss about Left atrial appendage occluder ([employer]) she can google, but is is a procedure if she would like to persue  Conjunctival hemorrhage does not cause blindness or any other side effects, but looks bad

## 2022-10-16 ENCOUNTER — Other Ambulatory Visit: Payer: Self-pay | Admitting: Internal Medicine

## 2022-10-16 DIAGNOSIS — Z1231 Encounter for screening mammogram for malignant neoplasm of breast: Secondary | ICD-10-CM

## 2022-10-25 ENCOUNTER — Other Ambulatory Visit: Payer: Self-pay | Admitting: Cardiology

## 2022-11-08 ENCOUNTER — Emergency Department (HOSPITAL_BASED_OUTPATIENT_CLINIC_OR_DEPARTMENT_OTHER): Payer: Medicare HMO

## 2022-11-08 ENCOUNTER — Encounter (HOSPITAL_BASED_OUTPATIENT_CLINIC_OR_DEPARTMENT_OTHER): Payer: Self-pay

## 2022-11-08 ENCOUNTER — Other Ambulatory Visit: Payer: Self-pay

## 2022-11-08 ENCOUNTER — Emergency Department (HOSPITAL_BASED_OUTPATIENT_CLINIC_OR_DEPARTMENT_OTHER)
Admission: EM | Admit: 2022-11-08 | Discharge: 2022-11-08 | Disposition: A | Discharge status: Home / Self Care | Payer: Medicare HMO | Attending: Emergency Medicine | Admitting: Emergency Medicine

## 2022-11-08 DIAGNOSIS — Z7901 Long term (current) use of anticoagulants: Secondary | ICD-10-CM | POA: Diagnosis not present

## 2022-11-08 DIAGNOSIS — S0990XA Unspecified injury of head, initial encounter: Secondary | ICD-10-CM | POA: Diagnosis present

## 2022-11-08 DIAGNOSIS — S0083XA Contusion of other part of head, initial encounter: Secondary | ICD-10-CM | POA: Insufficient documentation

## 2022-11-08 DIAGNOSIS — S0101XA Laceration without foreign body of scalp, initial encounter: Secondary | ICD-10-CM | POA: Insufficient documentation

## 2022-11-08 DIAGNOSIS — W133XXA Fall through floor, initial encounter: Secondary | ICD-10-CM | POA: Diagnosis not present

## 2022-11-08 DIAGNOSIS — Z79899 Other long term (current) drug therapy: Secondary | ICD-10-CM | POA: Insufficient documentation

## 2022-11-08 LAB — CBC
HCT: 30.5 % — ABNORMAL LOW (ref 36.0–46.0)
Hemoglobin: 10.1 g/dL — ABNORMAL LOW (ref 12.0–15.0)
MCH: 34.1 pg — ABNORMAL HIGH (ref 26.0–34.0)
MCHC: 33.1 g/dL (ref 30.0–36.0)
MCV: 103 fL — ABNORMAL HIGH (ref 80.0–100.0)
Platelets: 205 10*3/uL (ref 150–400)
RBC: 2.96 MIL/uL — ABNORMAL LOW (ref 3.87–5.11)
RDW: 14.1 % (ref 11.5–15.5)
WBC: 7.4 10*3/uL (ref 4.0–10.5)
nRBC: 0 % (ref 0.0–0.2)

## 2022-11-08 LAB — BASIC METABOLIC PANEL
Anion gap: 9 (ref 5–15)
BUN: 23 mg/dL (ref 8–23)
CO2: 24 mmol/L (ref 22–32)
Calcium: 9.1 mg/dL (ref 8.9–10.3)
Chloride: 105 mmol/L (ref 98–111)
Creatinine, Ser: 1.37 mg/dL — ABNORMAL HIGH (ref 0.44–1.00)
GFR, Estimated: 38 mL/min — ABNORMAL LOW (ref >60)
Glucose, Bld: 94 mg/dL (ref 70–99)
Potassium: 3.9 mmol/L (ref 3.5–5.1)
Sodium: 138 mmol/L (ref 135–145)

## 2022-11-08 MED ORDER — LIDOCAINE-EPINEPHRINE (PF) 2 %-1:200000 IJ SOLN
10.0000 mL | Freq: Once | INTRAMUSCULAR | Status: AC
  Administered 2022-11-08: 10 mL
  Filled 2022-11-08: qty 20

## 2022-11-08 NOTE — ED Notes (Signed)
Laceration dressed per MD verbal instructions with nonadherent bandage, left ED via wheelchair with daughter and husband.

## 2022-11-08 NOTE — ED Notes (Signed)
ED Provider at bedside for laceration repair.  

## 2022-11-08 NOTE — Discharge Instructions (Signed)
You will need to have the 5 stitches removed in 5 days.  This can happen your primary care physician's office or in urgent care or emergency department

## 2022-11-08 NOTE — ED Provider Notes (Signed)
Kimberly Villegas EMERGENCY DEPARTMENT AT [facility] Provider Note   CSN: [id number] Arrival date & time: 11/08/22  1945     History  Chief Complaint  Patient presents with   Fall on thinners    Kimberly Villegas is a 88 y.o. female presenting to the ER with a fall and head injury.  The patient is on Eliquis.  She reports that she felt that she had lost her balance earlier, tried to reach out for support but there was none there and she fell and struck her head.  This happened about an hour prior to arrival.  Small laceration over left eyebrow.  HPI     Home Medications Prior to Admission medications   Medication Sig Start Date End Date Taking? Authorizing Provider  apixaban (ELIQUIS) 5 MG TABS tablet Take 5 mg by mouth 2 (two) times daily after a meal.    [provider]  atorvastatin (LIPITOR) 40 MG tablet TAKE 1 TABLET DAILY 10/25/22   Yates Decamp, MD  cephALEXin (KEFLEX) 500 MG capsule Take 500 mg by mouth 4 (four) times daily. 08/19/22   [provider]  cholecalciferol (VITAMIN D3) 25 MCG (1000 UNIT) tablet Take 1,000 Units by mouth daily.    [provider]  fluticasone (FLONASE) 50 MCG/ACT nasal spray Place 1 spray into both nostrils daily.    [provider]  metoprolol succinate (TOPROL-XL) 25 MG 24 hr tablet Take 2 tablets (50 mg total) by mouth daily. 09/23/22   Yates Decamp, MD  MULTAQ 400 MG tablet TAKE 1 TABLET TWICE A DAY AFTER MEALS 06/24/22   Yates Decamp, MD  Multiple Vitamins-Minerals (PRESERVISION AREDS 2) CAPS Take 1 capsule by mouth 2 (two) times daily.    [provider]  NON FORMULARY Take 1-2 tablets by mouth See admin instructions. Bone strength supplement, Take 1 tablet in the morning and 2 tablets in the evening    [provider]  Probiotic Product (ALIGN) 4 MG CAPS Take 4 mg by mouth daily as needed (bloating).    [provider]  Turmeric 500 MG CAPS Take 500 mg by mouth daily.    [provider]  Wheat Dextrin (BENEFIBER PO) Take 1 Dose by mouth daily.    [provider]  zaleplon (SONATA) 5 MG capsule Take 5 mg by mouth at bedtime as needed for sleep.    [provider]      Allergies    Doxycycline    Review of Systems   Review of Systems  Physical Exam Updated Vital Signs BP 111/60   Pulse 78   Temp 97.6 F (36.4 C)   Resp 18   Ht 5\' 5"  (1.651 m)   Wt 62.6 kg   SpO2 99%   BMI 22.96 kg/m  Physical Exam Constitutional:      General: She is not in acute distress. HENT:     Head: Normocephalic.     Comments: 1 and half centimeter laceration over left eyebrow Eyes:     Conjunctiva/sclera: Conjunctivae normal.     Pupils: Pupils are equal, round, and reactive to light.  Cardiovascular:     Rate and Rhythm: Normal rate and regular rhythm.  Pulmonary:     Effort: Pulmonary effort is normal. No respiratory distress.  Abdominal:     General: There is no distension.     Tenderness: There is no abdominal tenderness.  Skin:    General: Skin is warm and dry.  Neurological:  General: No focal deficit present.     Mental Status: She is alert. Mental status is at baseline.  Psychiatric:        Mood and Affect: Mood normal.        Behavior: Behavior normal.     ED Results / Procedures / Treatments   Labs (all labs ordered are listed, but only abnormal results are displayed) Labs Reviewed  BASIC METABOLIC PANEL - Abnormal; Notable for the following components:      Result Value   Creatinine, Ser 1.37 (*)    GFR, Estimated 38 (*)    All other components within normal limits  CBC - Abnormal; Notable for the following components:   RBC 2.96 (*)    Hemoglobin 10.1 (*)    HCT 30.5 (*)    MCV 103.0 (*)    MCH 34.1 (*)    All other components within normal limits    EKG EKG Interpretation Date/Time:  Friday November 08 2022 20:25:21 EDT Ventricular Rate:  75 PR Interval:  230 QRS Duration:  103 QT Interval:  455 QTC  Calculation: 509 R Axis:   88  Text Interpretation: Sinus rhythm Borderline right axis deviation Low voltage, precordial leads Prolonged QT interval Confirmed by Alvester Chou [id number]) on 11/08/2022 9:55:36 PM  Radiology CT Head Wo Contrast  Result Date: 11/08/2022 CLINICAL DATA:  Recent fall with headaches, initial encounter EXAM: CT HEAD WITHOUT CONTRAST TECHNIQUE: Contiguous axial images were obtained from the base of the skull through the vertex without intravenous contrast. RADIATION DOSE REDUCTION: This exam was performed according to the departmental dose-optimization program which includes automated exposure control, adjustment of the mA and/or kV according to patient size and/or use of iterative reconstruction technique. COMPARISON:  08/26/2022 FINDINGS: Brain: No evidence of acute infarction, hemorrhage, hydrocephalus, extra-axial collection or mass lesion/mass effect. Vascular: No hyperdense vessel or unexpected calcification. Skull: Normal. Negative for fracture or focal lesion. Sinuses/Orbits: No acute finding. Other: None. IMPRESSION: No acute intracranial abnormality noted. Electronically Signed   By: Alcide Clever M.D.   On: 11/08/2022 21:07    Procedures .Marland KitchenLaceration Repair  Date/Time: 11/08/2022 11:16 PM  Performed by: Terald Sleeper, MD Authorized by: Terald Sleeper, MD   Consent:    Consent obtained:  Verbal   Consent given by:  Patient   Risks discussed:  Infection, pain and poor cosmetic result Universal protocol:    Procedure explained and questions answered to patient or proxy's satisfaction: yes     Relevant documents present and verified: yes     Test results available: yes     Imaging studies available: yes     Required blood products, implants, devices, and special equipment available: yes     Site/side marked: yes     Immediately prior to procedure, a time out was called: yes     Patient identity confirmed:  Arm band Anesthesia:    Anesthesia method:   Local infiltration   Local anesthetic:  Lidocaine 1% WITH epi Laceration details:    Location:  Scalp   Scalp location:  Frontal   Length (cm):  2 Pre-procedure details:    Preparation:  Patient was prepped and draped in usual sterile fashion and imaging obtained to evaluate for foreign bodies Exploration:    Contaminated: no   Treatment:    Area cleansed with:  Saline   Amount of cleaning:  Standard   Irrigation solution:  Sterile saline Skin repair:    Repair method:  Sutures   Suture  size:  5-0   Suture material:  Prolene   Suture technique:  Simple interrupted   Number of sutures:  5 Approximation:    Approximation:  Close Repair type:    Repair type:  Simple Post-procedure details:    Dressing:  Non-adherent dressing   Procedure completion:  Tolerated well, no immediate complications     Medications Ordered in ED Medications  lidocaine-EPINEPHrine (XYLOCAINE W/EPI) 2 %-1:200000 (PF) injection 10 mL (10 mLs Infiltration Given 11/08/22 2115)    ED Course/ Medical Decision Making/ A&P                             Medical Decision Making Amount and/or Complexity of Data Reviewed Labs: ordered. Radiology: ordered. ECG/medicine tests: ordered.  Risk Prescription drug management.   Patient is here with a fall, small laceration over left eyebrow which was cleaned and closed.  CT imaging reviewed personally with no emergent findings.  Patient's family member at the bedside provides supplemental history.  Her daughter questions why the patient has had 2 falls in a month, and it is difficult to ascertain is speaking to the patient, but she does appear to be having some balance difficulties.  Not necessarily lightheadedness or syncope.  There are many potential reasons for a fall, including orthostasis, but most likely her fall was related to alcohol consumption (she had 2 glasses of wine tonight, although she very rarely drinks alcohol).  Her CT scan labs are personally  reviewed and interpreted, no emergent findings.  She is stable for discharge in company of her daughter and husband.        Final Clinical Impression(s) / ED Diagnoses Final diagnoses:  Traumatic hematoma of forehead, initial encounter  Laceration of scalp, initial encounter    Rx / DC Orders ED Discharge Orders     None         Liora Myles, Kermit Balo, MD 11/08/22 2317

## 2022-11-08 NOTE — ED Triage Notes (Signed)
Pt arrived POV after falling and hitting her heard on the floor while walking, reports 2 glasses of wine at dinner. Pt is on thinners, similar incident a few months ago. Denies LOC, recalls event. Pt with laceration above the left eyebrow, bleeding controlled, GCS 15, VSS.

## 2022-11-11 IMAGING — MG MM DIGITAL SCREENING BILAT W/ TOMO AND CAD
8 series · 9 of 24 positions shown · non-contrast
Comparison: Previous exam(s).

CLINICAL DATA: Screening.

EXAM:
DIGITAL SCREENING BILATERAL MAMMOGRAM WITH TOMOSYNTHESIS AND CAD
TECHNIQUE: Bilateral screening digital craniocaudal and mediolateral oblique
mammograms were obtained. Bilateral screening digital breast
tomosynthesis was performed. The images were evaluated with
computer-aided detection.

[R MLO synth-2D]
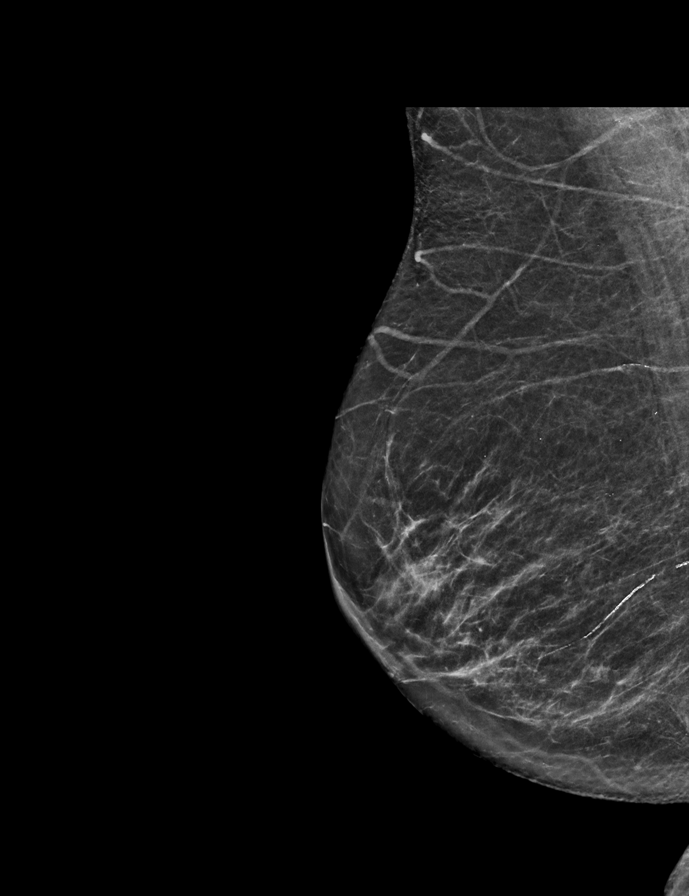

[R CC synth-2D]
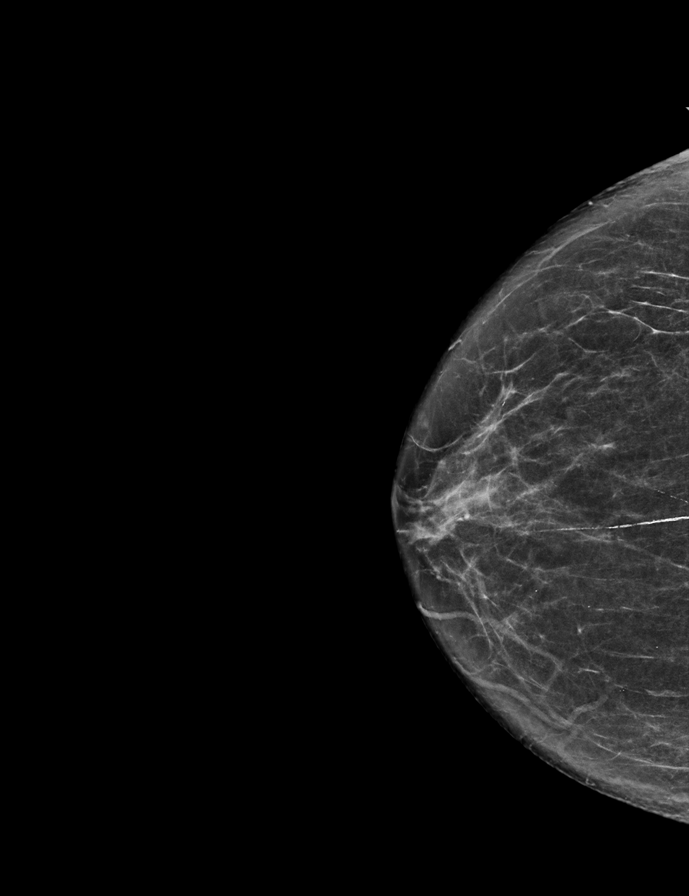

[L MLO synth-2D]
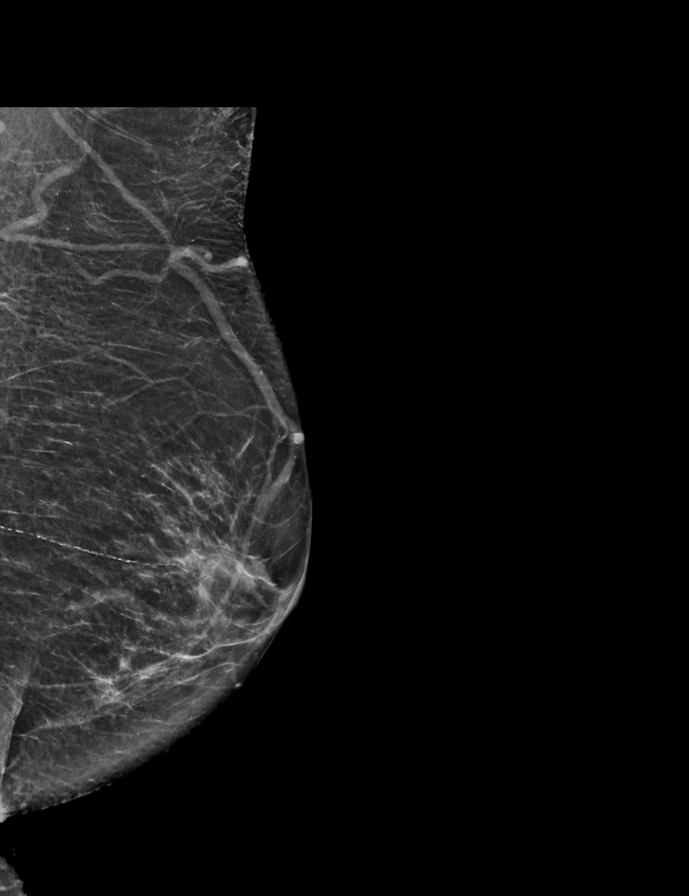

[L CC synth-2D]
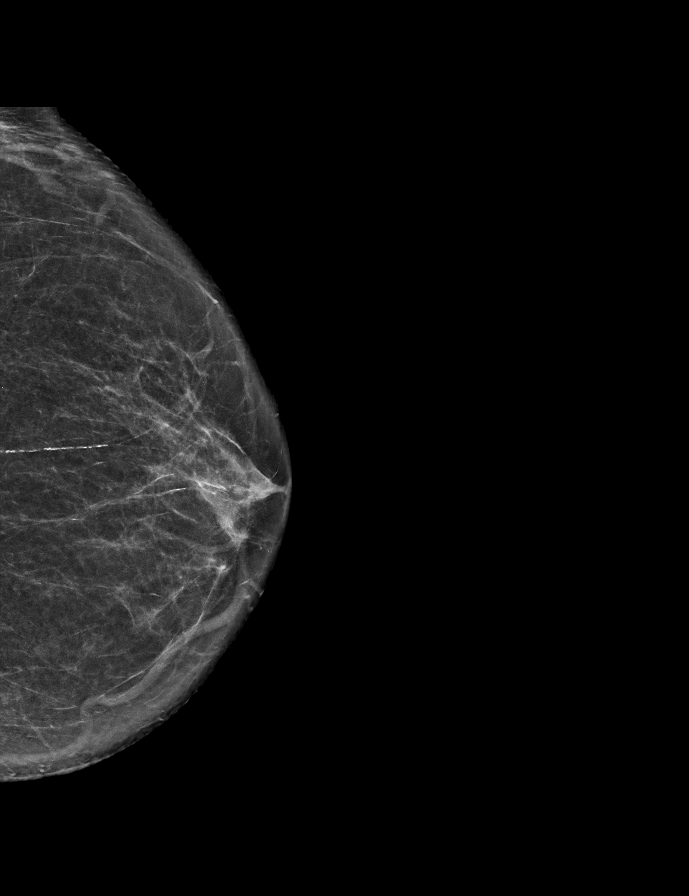

[L MLO tomo · 2 of 63 frames shown]
[frame 21/63]
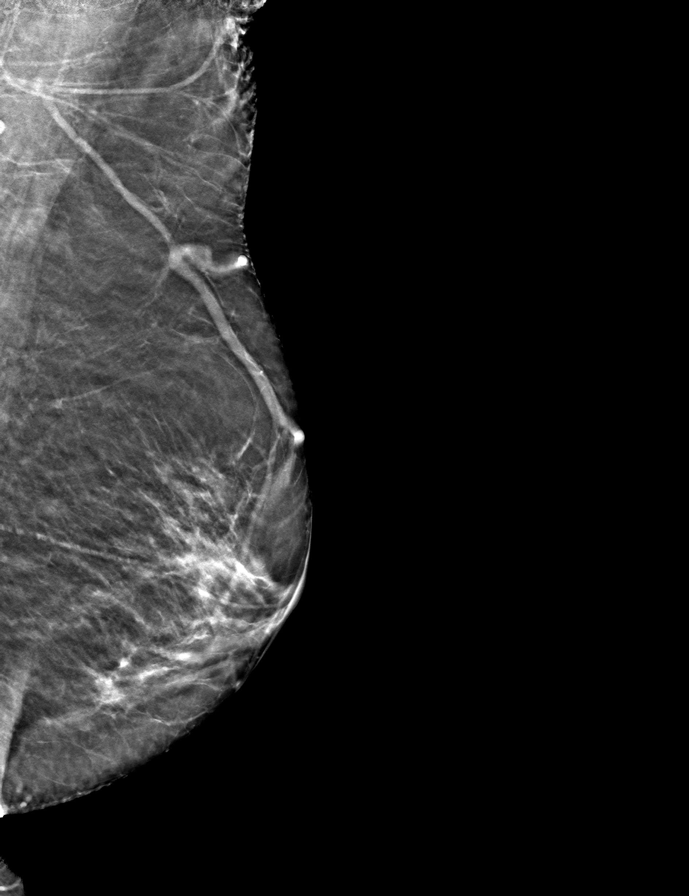
[frame 32/63]
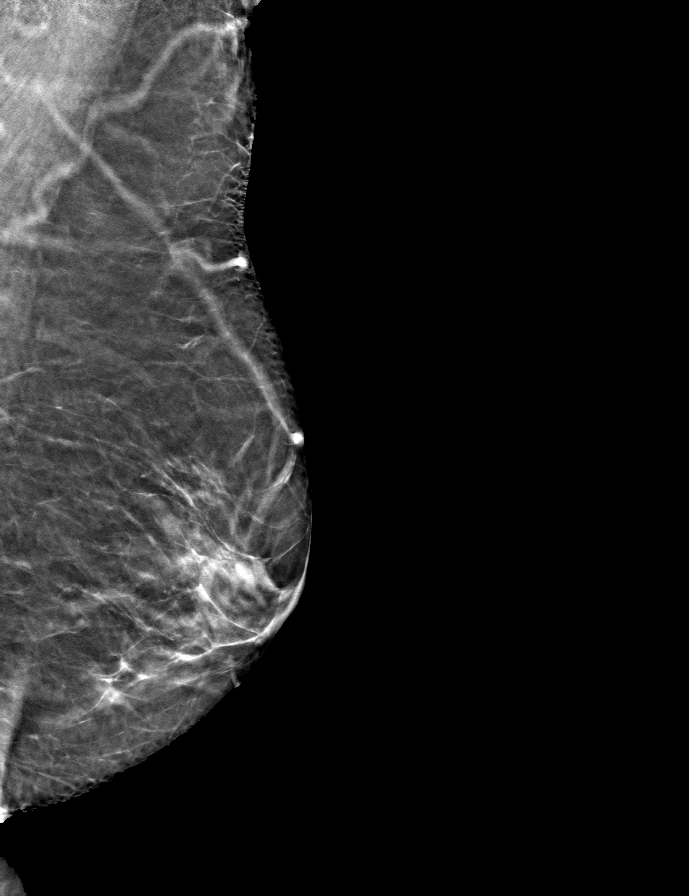

[R MLO tomo · tomo slice 31/60.0]
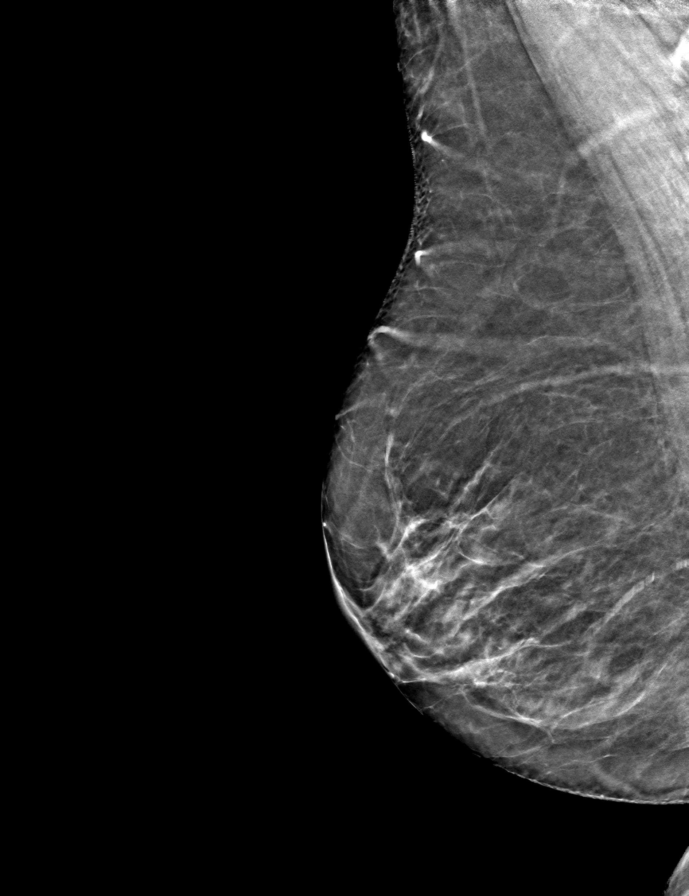

[R CC tomo · tomo slice 33/66.0]
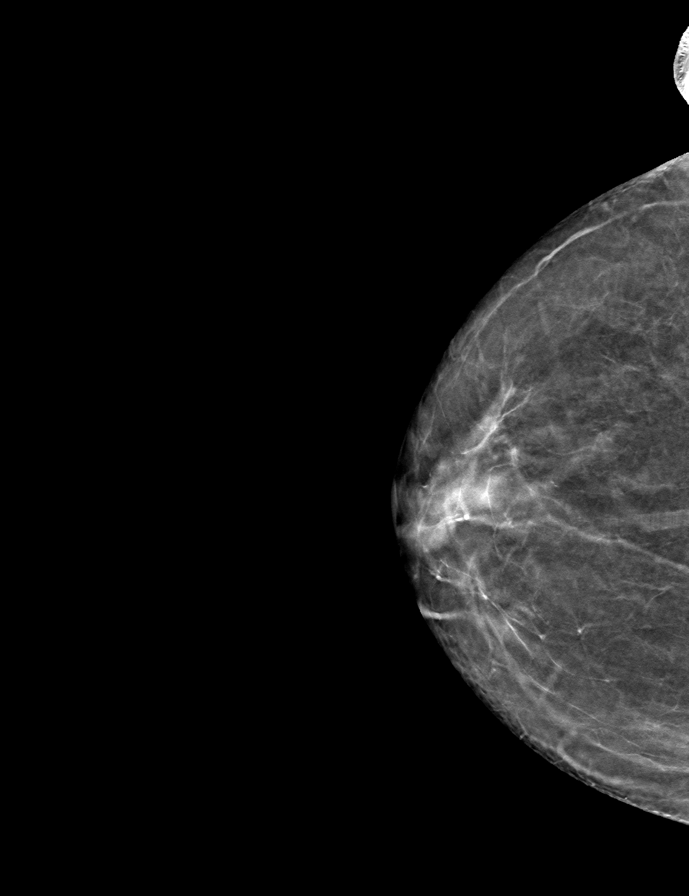

[L CC tomo · tomo slice 32/63.0]
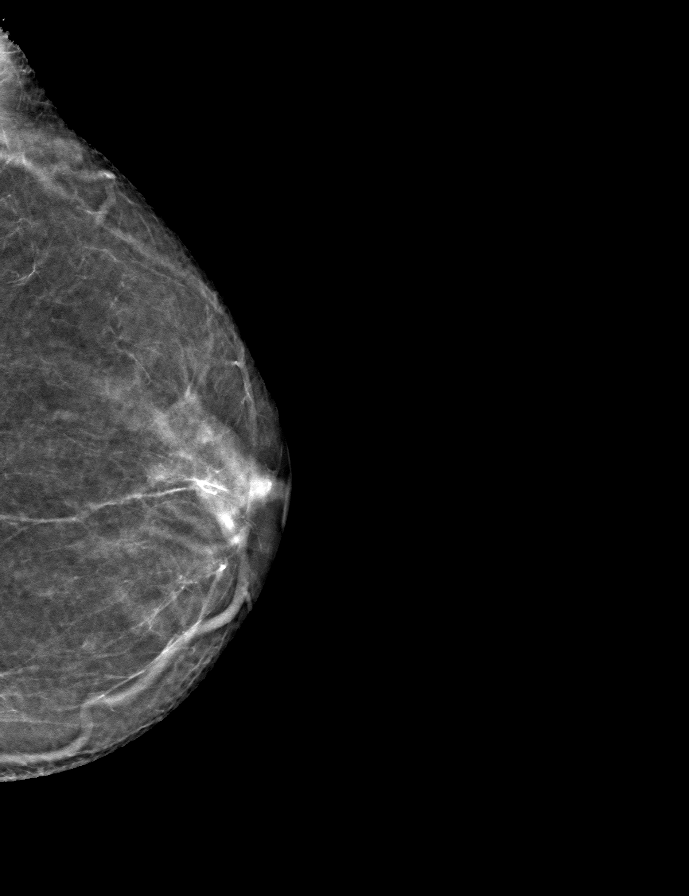

[9 of 24 positions shown; findings below may reference images not displayed]

ACR Breast Density Category c: The breast tissue is heterogeneously
dense, which may obscure small masses.
FINDINGS: In the left breast, a possible asymmetry warrants further
evaluation. In the right breast, no findings suspicious for
malignancy.
IMPRESSION: Further evaluation is suggested for possible asymmetry in the left
breast.

RECOMMENDATION:
Diagnostic mammogram and possibly ultrasound of the left breast.
(Code:8C-V-11X)

The patient will be contacted regarding the findings, and additional
imaging will be scheduled.

BI-RADS CATEGORY  0: Incomplete. Need additional imaging evaluation
and/or prior mammograms for comparison.

## 2022-11-13 DIAGNOSIS — Z7901 Long term (current) use of anticoagulants: Secondary | ICD-10-CM | POA: Insufficient documentation

## 2022-11-22 ENCOUNTER — Ambulatory Visit: Payer: Medicare HMO

## 2022-11-27 ENCOUNTER — Ambulatory Visit: Payer: Medicare HMO | Admitting: Podiatry

## 2022-11-27 ENCOUNTER — Encounter: Payer: Self-pay | Admitting: Podiatry

## 2022-11-27 DIAGNOSIS — M79674 Pain in right toe(s): Secondary | ICD-10-CM | POA: Diagnosis not present

## 2022-11-27 DIAGNOSIS — Q828 Other specified congenital malformations of skin: Secondary | ICD-10-CM | POA: Diagnosis not present

## 2022-11-27 DIAGNOSIS — Z89429 Acquired absence of other toe(s), unspecified side: Secondary | ICD-10-CM

## 2022-11-27 DIAGNOSIS — M79675 Pain in left toe(s): Secondary | ICD-10-CM | POA: Diagnosis not present

## 2022-11-27 DIAGNOSIS — B351 Tinea unguium: Secondary | ICD-10-CM | POA: Diagnosis not present

## 2022-12-04 ENCOUNTER — Ambulatory Visit: Payer: Medicare HMO | Admitting: Internal Medicine

## 2022-12-04 ENCOUNTER — Encounter: Payer: Self-pay | Admitting: Internal Medicine

## 2022-12-04 ENCOUNTER — Ambulatory Visit: Payer: Medicare HMO | Attending: Internal Medicine | Admitting: Internal Medicine

## 2022-12-04 VITALS — BP 96/58 | HR 87 | Ht 65.0 in | Wt 138.8 lb

## 2022-12-04 DIAGNOSIS — R55 Syncope and collapse: Secondary | ICD-10-CM

## 2022-12-04 NOTE — Patient Instructions (Signed)
Medication Instructions:  Your physician recommends that you continue on your current medications as directed. Please refer to the Current Medication list given to you today.  *If you need a refill on your cardiac medications before your next appointment, please call your pharmacy*   Lab Work: None ordered If you have labs (blood work) drawn today and your tests are completely normal, you will receive your results only by: MyChart Message (if you have MyChart) OR A paper copy in the mail If you have any lab test that is abnormal or we need to change your treatment, we will call you to review the results.   Testing/Procedures: None ordered   Follow-Up: At [facility], you and your health needs are our priority.  As part of our continuing mission to provide you with exceptional heart care, we have created designated Provider Care Teams.  These Care Teams include your primary Cardiologist (physician) and Advanced Practice Providers (APPs -  Physician Assistants and Nurse Practitioners) who all work together to provide you with the care you need, when you need it.  Your next appointment:   3 month(s)  The format for your next appointment:   In Person  Provider:   You may see Lewayne Bunting, MD or one of the following Advanced Practice Providers on your designated Care Team:   Francis Dowse, [place] [address] [place], [place] Sherie Don, NP Canary Brim, NP{

## 2022-12-04 NOTE — Progress Notes (Signed)
HPI Kimberly Villegas presents today for evaluation of syncope. She is  a pleasant 88 yo woman who has seen Dr. Lalla Villegas and Kimberly Villegas in the past. Her daughter is a family friend and asked me to see her in followup. She recently had a syncopal episode which occurred when she got up and was walking and fell face forward. She has had no tongue biting or loss of continence. She has been noted to have low bp's in the doctors office and at home. She has known atypical atrial flutter and has been fairly well controlled with multaq. She does not feel palpitations. No other complaints today.  Allergies  Allergen Reactions   Doxycycline     photosensitivity     Current Outpatient Medications  Medication Sig Dispense Refill   apixaban (ELIQUIS) 5 MG TABS tablet Take 5 mg by mouth 2 (two) times daily after a meal.     atorvastatin (LIPITOR) 40 MG tablet TAKE 1 TABLET DAILY 90 tablet 3   cephALEXin (KEFLEX) 500 MG capsule Take 500 mg by mouth 4 (four) times daily.     fluticasone (FLONASE) 50 MCG/ACT nasal spray Place 1 spray into both nostrils daily.     metoprolol succinate (TOPROL-XL) 25 MG 24 hr tablet Take 2 tablets (50 mg total) by mouth daily. 90 tablet 3   MULTAQ 400 MG tablet TAKE 1 TABLET TWICE A DAY AFTER MEALS 180 tablet 3   Multiple Vitamins-Minerals (PRESERVISION AREDS 2) CAPS Take 1 capsule by mouth 2 (two) times daily.     NON FORMULARY Take 1-2 tablets by mouth See admin instructions. Bone strength supplement, Take 1 tablet in the morning and 2 tablets in the evening     Probiotic Product (ALIGN) 4 MG CAPS Take 4 mg by mouth daily as needed (bloating).     Wheat Dextrin (BENEFIBER PO) Take 1 Dose by mouth daily.     zaleplon (SONATA) 5 MG capsule Take 5 mg by mouth at bedtime as needed for sleep.     No current facility-administered medications for this visit.     Past Medical History:  Diagnosis Date   A-fib (HCC)    Anemia, chronic disease    Arthritis    Asthma    Bilateral  carotid bruits    CAD (coronary artery disease)    Cervical stenosis of spine    Cough    Diastolic dysfunction    DVT (deep venous thrombosis) (HCC)    after vein stripping years ago   GERD (gastroesophageal reflux disease)    Hyperlipemia    Hypertension    Insomnia    New onset a-fib (HCC) 2018   Seasonal allergies    Syncope    Vitamin D deficiency     ROS:   All systems reviewed and negative except as noted in the HPI.   Past Surgical History:  Procedure Laterality Date   AMPUTATION TOE Bilateral 07/25/2016   Procedure: bilateral 2nd toe amputations;  Surgeon: Toni Arthurs, MD;  Location: [facility];  Service: Orthopedics;  Laterality: Bilateral;   BASAL CELL CARCINOMA EXCISION     CARDIOVERSION N/A 09/10/2016   Procedure: CARDIOVERSION;  Surgeon: Yates Decamp, MD;  Location: [facility] ENDOSCOPY;  Service: Cardiovascular;  Laterality: N/A;   CARDIOVERSION N/A 07/29/2017   Procedure: CARDIOVERSION;  Surgeon: Elder Negus, MD;  Location: [facility] ENDOSCOPY;  Service: Cardiovascular;  Laterality: N/A;   CARDIOVERSION N/A 07/18/2020   Procedure: CARDIOVERSION;  Surgeon: Yates Decamp, MD;  Location: [facility] ENDOSCOPY;  Service: Cardiovascular;  Laterality: N/A;   CARDIOVERSION N/A 09/04/2021   Procedure: CARDIOVERSION;  Surgeon: Yates Decamp, MD;  Location: [facility] ENDOSCOPY;  Service: Cardiovascular;  Laterality: N/A;   FOOT SURGERY Left    JOINT REPLACEMENT Right    VEIN LIGATION AND STRIPPING Right      Family History  Problem Relation Age of Onset   Heart disease Mother    Stroke Mother    Colon cancer Father    Heart disease Brother    Cancer Brother    Healthy Child    Healthy Child    Healthy Child    Healthy Grandchild    Healthy Grandchild    Healthy Grandchild    Healthy Grandchild    Healthy Grandchild    Breast cancer Neg Hx      Social History   Socioeconomic History   Marital status: Married    Spouse name: Not on file   Number of children: 3   Years  of education: Not on file   Highest education level: Not on file  Occupational History   Not on file  Tobacco Use   Smoking status: Former    Current packs/day: 0.00    Average packs/day: 0.3 packs/day for 5.0 years (1.3 ttl pk-yrs)    Types: Cigarettes    Start date: 77    Quit date: 1970    Years since quitting: 54.6   Smokeless tobacco: Never   Tobacco comments:    social  Advertising account planner   Vaping status: Never Used  Substance and Sexual Activity   Alcohol use: Not Currently    Alcohol/week: 1.0 standard drink of alcohol    Types: 1 Glasses of wine per week    Comment: Occassionally   Drug use: No   Sexual activity: Not Currently  Other Topics Concern   Not on file  Social History Narrative   Not on file   Social Determinants of Health   Financial Resource Strain: Not on file  Food Insecurity: Not on file  Transportation Needs: Not on file  Physical Activity: Not on file  Stress: Not on file  Social Connections: Unknown (09/17/2021)   Received [facility]   Social Network    Social Network: Not on file  Intimate Partner Violence: Unknown (08/09/2021)   Received from [facility]   HITS    Physically Hurt: Not on file    Insult or Talk Down To: Not on file    Threaten Physical Harm: Not on file    Scream or Curse: Not on file     BP (!) 96/58   Pulse 87   Ht 5\' 5"  (1.651 m)   Wt 138 lb 12.8 oz (63 kg)   SpO2 97%   BMI 23.10 kg/m   Physical Exam:  Well appearing NAD HEENT: Unremarkable Neck:  No JVD, no thyromegally Lymphatics:  No adenopathy Back:  No CVA tenderness Lungs:  Clear with no wheezes HEART:  Regular rate rhythm, no murmurs, no rubs, no clicks Abd:  soft, positive bowel sounds, no organomegally, no rebound, no guarding Ext:  2 plus pulses, no edema, no cyanosis, no clubbing Skin:  No rashes no nodules Neuro:  CN II through XII intact, motor grossly intact  Assess/Plan: Syncope - I suspect that she is having orthostasis. I asked  her not to miss any meals and to increase the salt in her diet. We considered stopping the toprol but will hold off for now. If  she passes out again I would place an ILR.  PAF - she appears to be maintaining NSR on multaq.  Coags - no bleeding on eliquis. Dyslipidemia - we discussed the reasons for continuing vs stopping. For now she will continue.  Sharlot Gowda Tamra Koos,MD

## 2022-12-04 NOTE — Progress Notes (Signed)
  Subjective:  Patient ID: Kimberly Villegas, female    DOB: 1934-10-16,  MRN: [id number]  88 y.o. female presents to clinic with at risk foot care. 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 Painful toenails interfere with ambulation. Aggravating factors include wearing enclosed shoe gear. Pain is relieved with periodic professional debridement. Painful porokeratotic lesions are aggravated when weightbearing with and without shoegear. Pain is relieved with periodic professional debridement. She is accompanied by her husband on today's visit. Patient states she had another fall since her last visit. She states she blacked out for a second and fell. She has an appointment with Cardiology. Chief Complaint  Patient presents with   NAIL CARE    RFC    New problem(s): None   PCP is Avva, Ravisankar, MD.  Allergies  Allergen Reactions   Doxycycline     photosensitivity   Review of Systems: Negative except as noted in the HPI.   Objective:  Kimberly Villegas is a pleasant 88 y.o. female WD, WN in NAD.Marland Kitchen  Vascular Examination: Vascular status intact b/l with palpable pedal pulses. CFT immediate b/l. No edema. No pain with calf compression b/l. Skin temperature gradient WNL b/l. No edema noted b/l LE. No cyanosis or clubbing noted b/l LE.  Neurological Examination: Sensation grossly intact b/l with 10 gram monofilament. Vibratory sensation intact b/l.   Dermatological Examination: Pedal skin with normal turgor, texture and tone b/l. Toenails bilateral great toes and 3-5 b/l thick, discolored, elongated with subungual debris and pain on dorsal palpation. Porokeratotic lesion(s) submet head 1 left foot. No erythema, no edema, no drainage, no fluctuance.  Musculoskeletal Examination: Muscle strength 5/5 to b/l LE. Lower extremity amputation(s): digital amputation bilateral 2nd  toes. HAV with bunion deformity noted b/l LE.  Radiographs: None  Assessment:   1. Pain due to onychomycosis of toenails of both feet   2. Porokeratosis   3. Status post amputation of lesser toe, unspecified laterality (HCC)    Plan:  -Patient was evaluated and treated. All patient's and/or POA's questions/concerns answered on today's visit. -Patient to continue soft, supportive shoe gear daily. -Mycotic toenails 1-5 bilaterally were debrided in length and girth with sterile nail nippers and dremel without incident. -Porokeratotic lesion(s) submet head 1 left foot pared and enucleated with sterile currette without incident. Total number of lesions debrided=1. -Patient/POA to call should there be question/concern in the interim.  Return in about 9 weeks (around 01/29/2023).  Freddie Breech, DPM

## 2022-12-14 ENCOUNTER — Encounter (HOSPITAL_BASED_OUTPATIENT_CLINIC_OR_DEPARTMENT_OTHER): Payer: Self-pay

## 2022-12-14 ENCOUNTER — Other Ambulatory Visit: Payer: Self-pay

## 2022-12-14 ENCOUNTER — Emergency Department (HOSPITAL_BASED_OUTPATIENT_CLINIC_OR_DEPARTMENT_OTHER)
Admission: EM | Admit: 2022-12-14 | Discharge: 2022-12-14 | Disposition: A | Discharge status: Home / Self Care | Payer: Medicare HMO | Attending: Emergency Medicine | Admitting: Emergency Medicine

## 2022-12-14 DIAGNOSIS — K91840 Postprocedural hemorrhage and hematoma of a digestive system organ or structure following a digestive system procedure: Secondary | ICD-10-CM | POA: Insufficient documentation

## 2022-12-14 DIAGNOSIS — K1379 Other lesions of oral mucosa: Secondary | ICD-10-CM

## 2022-12-14 DIAGNOSIS — Z9189 Other specified personal risk factors, not elsewhere classified: Secondary | ICD-10-CM | POA: Diagnosis not present

## 2022-12-14 DIAGNOSIS — Z7901 Long term (current) use of anticoagulants: Secondary | ICD-10-CM | POA: Diagnosis not present

## 2022-12-14 MED ORDER — TRANEXAMIC ACID 1000 MG/10ML IV SOLN
500.0000 mg | Freq: Once | INTRAVENOUS | Status: AC
  Administered 2022-12-14: 500 mg via TOPICAL
  Filled 2022-12-14: qty 10

## 2022-12-14 MED ORDER — OXIDIZED CELLULOSE EX PADS
1.0000 | MEDICATED_PAD | Freq: Once | CUTANEOUS | Status: AC
  Administered 2022-12-14: 1 via TOPICAL
  Filled 2022-12-14: qty 1

## 2022-12-14 NOTE — ED Notes (Signed)
Pt resting in bed. No acute distress and airway intact. Call light in reach and family at the bedside.

## 2022-12-14 NOTE — ED Provider Notes (Signed)
[place] EMERGENCY DEPARTMENT AT [facility] Provider Note   CSN: [id number] Arrival date & time: 12/14/22  6578     History  Chief Complaint  Patient presents with   post dental procedure bleeding    FINDLAY DRAUS is a 88 y.o. female.  HPI    88 year old female comes into the emergency room with chief complaint of bleeding.  Patient accompanied by her husband and daughter.  Daughter provides substantial part of the history.  Patient is on Eliquis because of A-fib.  On Monday, 5 days ago she had dental procedure done over her front, top tooth.  Patient was doing well until this morning when she started bleeding around 4 AM.  Patient has constant ooze since that time.  They called the orthodontics and they recommended that patient come to the emergency room for assessment.  Home Medications Prior to Admission medications   Medication Sig Start Date End Date Taking? Authorizing Provider  apixaban (ELIQUIS) 5 MG TABS tablet Take 5 mg by mouth 2 (two) times daily after a meal.    [provider]  atorvastatin (LIPITOR) 40 MG tablet TAKE 1 TABLET DAILY 10/25/22   Yates Decamp, MD  cephALEXin (KEFLEX) 500 MG capsule Take 500 mg by mouth 4 (four) times daily. 08/19/22   [provider]  fluticasone (FLONASE) 50 MCG/ACT nasal spray Place 1 spray into both nostrils daily.    [provider]  metoprolol succinate (TOPROL-XL) 25 MG 24 hr tablet Take 2 tablets (50 mg total) by mouth daily. 09/23/22   Yates Decamp, MD  MULTAQ 400 MG tablet TAKE 1 TABLET TWICE A DAY AFTER MEALS 06/24/22   Yates Decamp, MD  Multiple Vitamins-Minerals (PRESERVISION AREDS 2) CAPS Take 1 capsule by mouth 2 (two) times daily.    [provider]  NON FORMULARY Take 1-2 tablets by mouth See admin instructions. Bone strength supplement, Take 1 tablet in the morning and 2 tablets in the evening    [provider]  Probiotic Product (ALIGN) 4 MG CAPS Take 4 mg by mouth  daily as needed (bloating).    [provider]  Wheat Dextrin (BENEFIBER PO) Take 1 Dose by mouth daily.    [provider]  zaleplon (SONATA) 5 MG capsule Take 5 mg by mouth at bedtime as needed for sleep.    [provider]      Allergies    Doxycycline    Review of Systems   Review of Systems  Physical Exam Updated Vital Signs BP 124/66   Pulse 86   Temp 98.2 F (36.8 C) (Oral)   Resp 17   SpO2 95%  Physical Exam HENT:     Mouth/Throat:     Comments: Gingiva above tooth #8 is actively oozing, with some clots in place. No trismus.    ED Results / Procedures / Treatments   Labs (all labs ordered are listed, but only abnormal results are displayed) Labs Reviewed - No data to display  EKG None  Radiology No results found.  Procedures Procedures    Medications Ordered in ED Medications  tranexamic acid (CYKLOKAPRON) injection 500 mg (500 mg Topical Given by Other 12/14/22 4696)  oxidized cellulose (Surgicel) pad 1 each (1 each Topical Given 12/14/22 0914)    ED Course/ Medical Decision Making/ A&P  Medical Decision Making Risk Prescription drug management.   88 year old patient comes in with chief complaint of dental bleeding.  Patient had orthodontic procedure done on Monday.  She is on Eliquis for A-fib.  Bleeding was not an issue until this morning.  Hemodynamically patient is stable.  I do not think patient needs any lab workup at this time.  She denies any dizziness, shortness of breath.  What is clear is that she is still oozing blood and she is on blood thinners.  We identified the area of oozing and applied gauze that was soaked with TXA to the affected area.  Pressure was applied for 10 minutes and on repeat assessment patient has no bleeding.  Family comfortable taking patient home right now.  Return precautions discussed.  Differential diagnosis for this patient includes wound  dehiscence, deroofing of a scab.  I have also given patient a thrombin gauze.  They can apply that gauze to the area of the bleeding if the bleeding returns and is heavy. Family has already contacted the orthodontics.  The orthodontics is available to see the patient rest of the day if she has any complications.  Final Clinical Impression(s) / ED Diagnoses Final diagnoses:  Oral bleeding  At risk for bleeding related to anticoagulants    Rx / DC Orders ED Discharge Orders     None         Derwood Kaplan, MD 12/14/22 1117

## 2022-12-14 NOTE — ED Notes (Signed)
Pt received AVS; Educated on bleeding control and when to follow back up for care. Pt and caregiver verbalized understanding and had no further question upon discharge.

## 2022-12-14 NOTE — Discharge Instructions (Addendum)
Apply a soaked gauze to the affected area and apply direct pressure for 15 minutes.  Also consider placing a tea bag in boiling water for 2-3 minutes, allow it to cool, then place the tea bag directly on bleeding site for 5 minutes.  If you continue to bleed, discuss the concern with the surgeon. If you are bleeding heavily, spitting clots - return to the ER.  STOP THE ELIQUIS FOR 2-3 DAYS.

## 2022-12-14 NOTE — ED Triage Notes (Signed)
She tells [place] she underwent root canal procedure this Monday. She is here with c/o bleeding from area in her mouth. She is on Eliquis for a-fib. She is ambulatory and in no distress.

## 2022-12-17 ENCOUNTER — Ambulatory Visit: Payer: Medicare HMO | Admitting: Internal Medicine

## 2022-12-18 ENCOUNTER — Ambulatory Visit
Admission: RE | Admit: 2022-12-18 | Discharge: 2022-12-18 | Disposition: A | Discharge status: Home / Self Care | Payer: Medicare HMO | Source: Ambulatory Visit | Attending: Internal Medicine | Admitting: Internal Medicine

## 2022-12-18 DIAGNOSIS — Z1231 Encounter for screening mammogram for malignant neoplasm of breast: Secondary | ICD-10-CM

## 2023-01-29 ENCOUNTER — Ambulatory Visit: Payer: Medicare HMO | Admitting: Podiatry

## 2023-02-05 ENCOUNTER — Ambulatory Visit: Payer: Medicare HMO | Admitting: Podiatry

## 2023-02-05 DIAGNOSIS — M79675 Pain in left toe(s): Secondary | ICD-10-CM | POA: Diagnosis not present

## 2023-02-05 DIAGNOSIS — M79674 Pain in right toe(s): Secondary | ICD-10-CM | POA: Diagnosis not present

## 2023-02-05 DIAGNOSIS — B351 Tinea unguium: Secondary | ICD-10-CM | POA: Diagnosis not present

## 2023-02-05 DIAGNOSIS — Z89429 Acquired absence of other toe(s), unspecified side: Secondary | ICD-10-CM

## 2023-02-05 DIAGNOSIS — K5900 Constipation, unspecified: Secondary | ICD-10-CM | POA: Insufficient documentation

## 2023-02-05 DIAGNOSIS — Q828 Other specified congenital malformations of skin: Secondary | ICD-10-CM | POA: Diagnosis not present

## 2023-02-05 NOTE — Progress Notes (Signed)
Subjective:  Patient ID: Kimberly Villegas, female    DOB: 05/27/1934,  MRN: [id number]  Kimberly Villegas presents to clinic today for: at risk foot care. Patient has h/o amputation of digital amputation L 2nd toe and R 2nd toe and painful porokeratotic lesion(s) right foot and painful mycotic toenails that limit ambulation. Painful toenails interfere with ambulation. Aggravating factors include wearing enclosed shoe gear. Pain is relieved with periodic professional debridement. Painful porokeratotic lesions are aggravated when weightbearing with and without shoegear. Pain is relieved with periodic professional debridement.  Chief Complaint  Patient presents with   RFC    RFC    PCP is Avva, Ravisankar, MD.  Allergies  Allergen Reactions   Doxycycline     photosensitivity    Review of Systems: Negative except as noted in the HPI.  Objective: No changes noted in today's physical examination. There were no vitals filed for this visit.  Kimberly Villegas is a pleasant 88 y.o. female in NAD. AAO x 3.  Vascular Examination: Capillary refill time <3 seconds b/l LE. Palpable pedal pulses b/l LE. Digital hair present b/l. No pedal edema b/l. Skin temperature gradient WNL b/l. No varicosities b/l. Marland Kitchen  Dermatological Examination: Pedal skin with normal turgor, texture and tone b/l. No open wounds. No interdigital macerations b/l. Toenails 1-5 b/l thickened, discolored, dystrophic with subungual debris. There is pain on palpation to dorsal aspect of nailplates. Porokeratotic lesion(s) 1st metatarsal head right lower extremity. No erythema, no edema, no drainage, no fluctuance..  Neurological Examination: Protective sensation intact with 10 gram monofilament b/l LE. Vibratory sensation intact b/l LE.   Musculoskeletal Examination: Muscle strength 5/5 to all lower extremity muscle groups bilaterally. Lower extremity amputation(s): digital amputation left second digit and right second digit.  Hallux valgus with bunion deformity noted b/l lower extremities.  Assessment/Plan: 1. Pain due to onychomycosis of toenails of both feet   2. Porokeratosis   3. Status post amputation of lesser toe, unspecified laterality (HCC)     -Consent given for treatment as described below: -Examined patient. -Continue supportive shoe gear daily. -Toenails bilateral great toes and 3-5 bilaterally debrided in length and girth without iatrogenic bleeding with sterile nail nipper and dremel.  -Porokeratotic lesion(s) 1st metatarsal head right foot pared and enucleated with sterile currette without incident. Total number of lesions debrided=1. -Patient/POA to call should there be question/concern in the interim.   Return in about 3 months (around 05/08/2023).  Freddie Breech, DPM

## 2023-02-09 ENCOUNTER — Encounter: Payer: Self-pay | Admitting: Podiatry

## 2023-03-10 ENCOUNTER — Ambulatory Visit: Payer: Medicare HMO | Attending: Internal Medicine | Admitting: Internal Medicine

## 2023-03-10 ENCOUNTER — Encounter: Payer: Self-pay | Admitting: Internal Medicine

## 2023-03-10 VITALS — BP 100/62 | HR 77 | Ht 65.0 in | Wt 140.8 lb

## 2023-03-10 DIAGNOSIS — I480 Paroxysmal atrial fibrillation: Secondary | ICD-10-CM

## 2023-03-10 NOTE — Progress Notes (Signed)
HPI Kimberly Villegas presents today for evaluation of syncope. She is a pleasant 88 yo woman who has seen Dr. Lalla Brothers and Jacinto Halim in the past. Her daughter is a family friend and asked me to see her in followup. She has both orthostasis and left atrial flutter and has done well with multaq. She does not have CHF. She has been encouraged to eat more salty food as her bp tends to run low. She has done better and in the interim has not passed out. No palpitations as well.  Allergies  Allergen Reactions   Doxycycline     photosensitivity     Current Outpatient Medications  Medication Sig Dispense Refill   apixaban (ELIQUIS) 5 MG TABS tablet Take 5 mg by mouth 2 (two) times daily after a meal.     atorvastatin (LIPITOR) 40 MG tablet TAKE 1 TABLET DAILY 90 tablet 3   cephALEXin (KEFLEX) 500 MG capsule Take 500 mg by mouth 4 (four) times daily.     fluticasone (FLONASE) 50 MCG/ACT nasal spray Place 1 spray into both nostrils daily.     metoprolol succinate (TOPROL-XL) 25 MG 24 hr tablet Take 2 tablets (50 mg total) by mouth daily. 90 tablet 3   MULTAQ 400 MG tablet TAKE 1 TABLET TWICE A DAY AFTER MEALS 180 tablet 3   Multiple Vitamins-Minerals (PRESERVISION AREDS 2) CAPS Take 1 capsule by mouth 2 (two) times daily.     NON FORMULARY Take 1-2 tablets by mouth See admin instructions. Bone strength supplement, Take 1 tablet in the morning and 2 tablets in the evening     Probiotic Product (ALIGN) 4 MG CAPS Take 4 mg by mouth daily as needed (bloating).     Wheat Dextrin (BENEFIBER PO) Take 1 Dose by mouth daily.     zaleplon (SONATA) 5 MG capsule Take 5 mg by mouth at bedtime as needed for sleep.     No current facility-administered medications for this visit.     Past Medical History:  Diagnosis Date   A-fib (HCC)    Anemia, chronic disease    Arthritis    Asthma    Bilateral carotid bruits    CAD (coronary artery disease)    Cervical stenosis of spine    Cough    Diastolic  dysfunction    DVT (deep venous thrombosis) (HCC)    after vein stripping years ago   GERD (gastroesophageal reflux disease)    Hyperlipemia    Hypertension    Insomnia    New onset a-fib (HCC) 2018   Seasonal allergies    Syncope    Vitamin D deficiency     ROS:   All systems reviewed and negative except as noted in the HPI.   Past Surgical History:  Procedure Laterality Date   AMPUTATION TOE Bilateral 07/25/2016   Procedure: bilateral 2nd toe amputations;  Surgeon: Toni Arthurs, MD;  Location:  SURGERY CENTER;  Service: Orthopedics;  Laterality: Bilateral;   BASAL CELL CARCINOMA EXCISION     CARDIOVERSION N/A 09/10/2016   Procedure: CARDIOVERSION;  Surgeon: Yates Decamp, MD;  Location: [facility] ENDOSCOPY;  Service: Cardiovascular;  Laterality: N/A;   CARDIOVERSION N/A 07/29/2017   Procedure: CARDIOVERSION;  Surgeon: Elder Negus, MD;  Location: [facility] ENDOSCOPY;  Service: Cardiovascular;  Laterality: N/A;   CARDIOVERSION N/A 07/18/2020   Procedure: CARDIOVERSION;  Surgeon: Yates Decamp, MD;  Location: [facility] ENDOSCOPY;  Service: Cardiovascular;  Laterality: N/A;   CARDIOVERSION N/A 09/04/2021   Procedure:  CARDIOVERSION;  Surgeon: Yates Decamp, MD;  Location: [facility] ENDOSCOPY;  Service: Cardiovascular;  Laterality: N/A;   FOOT SURGERY Left    JOINT REPLACEMENT Right    VEIN LIGATION AND STRIPPING Right      Family History  Problem Relation Age of Onset   Heart disease Mother    Stroke Mother    Colon cancer Father    Heart disease Brother    Cancer Brother    Healthy Child    Healthy Child    Healthy Child    Healthy Grandchild    Healthy Grandchild    Healthy Grandchild    Healthy Grandchild    Healthy Grandchild    Breast cancer Neg Hx      Social History   Socioeconomic History   Marital status: Married    Spouse name: Not on file   Number of children: 3   Years of education: Not on file   Highest education level: Not on file  Occupational History   Not on file   Tobacco Use   Smoking status: Former    Current packs/day: 0.00    Average packs/day: 0.3 packs/day for 5.0 years (1.3 ttl pk-yrs)    Types: Cigarettes    Start date: 1    Quit date: 1970    Years since quitting: 54.8   Smokeless tobacco: Never   Tobacco comments:    social  Advertising account planner   Vaping status: Never Used  Substance and Sexual Activity   Alcohol use: Not Currently    Alcohol/week: 1.0 standard drink of alcohol    Types: 1 Glasses of wine per week    Comment: Occassionally   Drug use: No   Sexual activity: Not Currently  Other Topics Concern   Not on file  Social History Narrative   Not on file   Social Determinants of Health   Financial Resource Strain: Not on file  Food Insecurity: Not on file  Transportation Needs: Not on file  Physical Activity: Not on file  Stress: Not on file  Social Connections: Unknown (09/17/2021)   [facility] from [facility], [facility]   Social Network    Social Network: Not on file  Intimate Partner Violence: Unknown (08/09/2021)   Received from [employer], [facility]   HITS    Physically Hurt: Not on file    Insult or Talk Down To: Not on file    Threaten Physical Harm: Not on file    Scream or Curse: Not on file     BP 100/62   Pulse 77   Ht 5\' 5"  (1.651 m)   Wt 140 lb 12.8 oz (63.9 kg)   SpO2 95%   BMI 23.43 kg/m   Physical Exam:  Well appearing NAD HEENT: Unremarkable Neck:  No JVD, no thyromegally Lymphatics:  No adenopathy Back:  No CVA tenderness Lungs:  Clear with no wheezes HEART:  Regular rate rhythm, no murmurs, no rubs, no clicks Abd:  soft, positive bowel sounds, no organomegally, no rebound, no guarding Ext:  2 plus pulses, no edema, no cyanosis, no clubbing Skin:  No rashes no nodules Neuro:  CN II through XII intact, motor grossly intact  EKG NSR  Assess/Plan:  Orthostasis -Her symptoms are much improved with salt, fluids and avoidance of sudden changes in her posture. PAF - she  appears to be maintaining NSR on multaq.  Coags - no bleeding on eliquis. Dyslipidemia - we discussed the reasons for continuing vs stopping. For now she  will continue.   Kimberly Gowda Nicanor Mendolia,MD

## 2023-03-10 NOTE — Patient Instructions (Addendum)
Medication Instructions:  Your physician recommends that you continue on your current medications as directed. Please refer to the Current Medication list given to you today.  *If you need a refill on your cardiac medications before your next appointment, please call your pharmacy*  Lab Work: None ordered.  If you have labs (blood work) drawn today and your tests are completely normal, you will receive your results only by: MyChart Message (if you have MyChart) OR A paper copy in the mail If you have any lab test that is abnormal or we need to change your treatment, we will call you to review the results.  Testing/Procedures: None ordered.  Follow-Up: At [facility], you and your health needs are our priority.  As part of our continuing mission to provide you with exceptional heart care, we have created designated Provider Care Teams.  These Care Teams include your primary Cardiologist (physician) and Advanced Practice Providers (APPs -  Physician Assistants and Nurse Practitioners) who all work together to provide you with the care you need, when you need it.   Your next appointment:   Follow up in July 2025  The format for your next appointment:   In Person  Provider:   Lewayne Bunting, MD{or one of the following Advanced Practice Providers on your designated Care Team:   Francis Dowse, [place] Casimiro Needle "Mardelle Matte" Banks, [place] Earnest Rosier, NP  Important Information About Sugar

## 2023-03-17 ENCOUNTER — Encounter (HOSPITAL_COMMUNITY): Payer: Self-pay

## 2023-03-17 ENCOUNTER — Other Ambulatory Visit: Payer: Self-pay

## 2023-03-17 ENCOUNTER — Emergency Department (HOSPITAL_COMMUNITY): Payer: Medicare HMO

## 2023-03-17 ENCOUNTER — Inpatient Hospital Stay (HOSPITAL_COMMUNITY)
Admission: EM | Admit: 2023-03-17 | Discharge: 2023-03-19 | DRG: 193 | Disposition: A | Discharge status: Home / Self Care | Payer: Medicare HMO | Attending: Internal Medicine | Admitting: Internal Medicine

## 2023-03-17 DIAGNOSIS — G4700 Insomnia, unspecified: Secondary | ICD-10-CM | POA: Diagnosis present

## 2023-03-17 DIAGNOSIS — I129 Hypertensive chronic kidney disease with stage 1 through stage 4 chronic kidney disease, or unspecified chronic kidney disease: Secondary | ICD-10-CM | POA: Diagnosis present

## 2023-03-17 DIAGNOSIS — Z881 Allergy status to other antibiotic agents status: Secondary | ICD-10-CM

## 2023-03-17 DIAGNOSIS — I480 Paroxysmal atrial fibrillation: Secondary | ICD-10-CM

## 2023-03-17 DIAGNOSIS — D631 Anemia in chronic kidney disease: Secondary | ICD-10-CM | POA: Diagnosis present

## 2023-03-17 DIAGNOSIS — Z823 Family history of stroke: Secondary | ICD-10-CM

## 2023-03-17 DIAGNOSIS — I4892 Unspecified atrial flutter: Secondary | ICD-10-CM | POA: Diagnosis present

## 2023-03-17 DIAGNOSIS — Z79899 Other long term (current) drug therapy: Secondary | ICD-10-CM

## 2023-03-17 DIAGNOSIS — M109 Gout, unspecified: Secondary | ICD-10-CM | POA: Diagnosis present

## 2023-03-17 DIAGNOSIS — R9431 Abnormal electrocardiogram [ECG] [EKG]: Secondary | ICD-10-CM

## 2023-03-17 DIAGNOSIS — Z89421 Acquired absence of other right toe(s): Secondary | ICD-10-CM

## 2023-03-17 DIAGNOSIS — J45909 Unspecified asthma, uncomplicated: Secondary | ICD-10-CM | POA: Diagnosis not present

## 2023-03-17 DIAGNOSIS — Z8616 Personal history of COVID-19: Secondary | ICD-10-CM

## 2023-03-17 DIAGNOSIS — J90 Pleural effusion, not elsewhere classified: Secondary | ICD-10-CM | POA: Diagnosis present

## 2023-03-17 DIAGNOSIS — Z8249 Family history of ischemic heart disease and other diseases of the circulatory system: Secondary | ICD-10-CM

## 2023-03-17 DIAGNOSIS — Z87891 Personal history of nicotine dependence: Secondary | ICD-10-CM

## 2023-03-17 DIAGNOSIS — J1289 Other viral pneumonia: Secondary | ICD-10-CM | POA: Diagnosis not present

## 2023-03-17 DIAGNOSIS — Z89422 Acquired absence of other left toe(s): Secondary | ICD-10-CM

## 2023-03-17 DIAGNOSIS — K219 Gastro-esophageal reflux disease without esophagitis: Secondary | ICD-10-CM | POA: Diagnosis present

## 2023-03-17 DIAGNOSIS — R6889 Other general symptoms and signs: Secondary | ICD-10-CM

## 2023-03-17 DIAGNOSIS — J9601 Acute respiratory failure with hypoxia: Secondary | ICD-10-CM | POA: Diagnosis not present

## 2023-03-17 DIAGNOSIS — B9789 Other viral agents as the cause of diseases classified elsewhere: Secondary | ICD-10-CM | POA: Diagnosis present

## 2023-03-17 DIAGNOSIS — D649 Anemia, unspecified: Secondary | ICD-10-CM | POA: Diagnosis present

## 2023-03-17 DIAGNOSIS — E785 Hyperlipidemia, unspecified: Secondary | ICD-10-CM | POA: Diagnosis present

## 2023-03-17 DIAGNOSIS — Z1152 Encounter for screening for COVID-19: Secondary | ICD-10-CM

## 2023-03-17 DIAGNOSIS — I10 Essential (primary) hypertension: Secondary | ICD-10-CM | POA: Diagnosis present

## 2023-03-17 DIAGNOSIS — I2510 Atherosclerotic heart disease of native coronary artery without angina pectoris: Secondary | ICD-10-CM | POA: Diagnosis present

## 2023-03-17 DIAGNOSIS — I4891 Unspecified atrial fibrillation: Secondary | ICD-10-CM | POA: Diagnosis present

## 2023-03-17 DIAGNOSIS — R072 Precordial pain: Secondary | ICD-10-CM | POA: Diagnosis not present

## 2023-03-17 DIAGNOSIS — H3530 Unspecified macular degeneration: Secondary | ICD-10-CM | POA: Diagnosis present

## 2023-03-17 DIAGNOSIS — Z86718 Personal history of other venous thrombosis and embolism: Secondary | ICD-10-CM

## 2023-03-17 DIAGNOSIS — H9190 Unspecified hearing loss, unspecified ear: Secondary | ICD-10-CM | POA: Diagnosis present

## 2023-03-17 DIAGNOSIS — Z800 Family history of malignant neoplasm of digestive organs: Secondary | ICD-10-CM

## 2023-03-17 DIAGNOSIS — N1831 Chronic kidney disease, stage 3a: Secondary | ICD-10-CM | POA: Diagnosis present

## 2023-03-17 DIAGNOSIS — Z85828 Personal history of other malignant neoplasm of skin: Secondary | ICD-10-CM

## 2023-03-17 DIAGNOSIS — Z7901 Long term (current) use of anticoagulants: Secondary | ICD-10-CM

## 2023-03-17 HISTORY — DX: Atypical atrial flutter: I48.4

## 2023-03-17 LAB — BASIC METABOLIC PANEL
Anion gap: 5 (ref 5–15)
BUN: 19 mg/dL (ref 8–23)
CO2: 26 mmol/L (ref 22–32)
Calcium: 8.9 mg/dL (ref 8.9–10.3)
Chloride: 109 mmol/L (ref 98–111)
Creatinine, Ser: 1.32 mg/dL — ABNORMAL HIGH (ref 0.44–1.00)
GFR, Estimated: 39 mL/min — ABNORMAL LOW (ref >60)
Glucose, Bld: 101 mg/dL — ABNORMAL HIGH (ref 70–99)
Potassium: 3.9 mmol/L (ref 3.5–5.1)
Sodium: 140 mmol/L (ref 135–145)

## 2023-03-17 LAB — CBC WITH DIFFERENTIAL/PLATELET
Abs Immature Granulocytes: 0.05 10*3/uL (ref 0.00–0.07)
Basophils Absolute: 0 10*3/uL (ref 0.0–0.1)
Basophils Relative: 0 %
Eosinophils Absolute: 0.2 10*3/uL (ref 0.0–0.5)
Eosinophils Relative: 2 %
HCT: 31.8 % — ABNORMAL LOW (ref 36.0–46.0)
Hemoglobin: 10.4 g/dL — ABNORMAL LOW (ref 12.0–15.0)
Immature Granulocytes: 1 %
Lymphocytes Relative: 9 %
Lymphs Abs: 0.8 10*3/uL (ref 0.7–4.0)
MCH: 33.2 pg (ref 26.0–34.0)
MCHC: 32.7 g/dL (ref 30.0–36.0)
MCV: 101.6 fL — ABNORMAL HIGH (ref 80.0–100.0)
Monocytes Absolute: 0.6 10*3/uL (ref 0.1–1.0)
Monocytes Relative: 7 %
Neutro Abs: 7.4 10*3/uL (ref 1.7–7.7)
Neutrophils Relative %: 81 %
Platelets: 161 10*3/uL (ref 150–400)
RBC: 3.13 MIL/uL — ABNORMAL LOW (ref 3.87–5.11)
RDW: 15.1 % (ref 11.5–15.5)
WBC: 9.1 10*3/uL (ref 4.0–10.5)
nRBC: 0 % (ref 0.0–0.2)

## 2023-03-17 LAB — PROCALCITONIN: Procalcitonin: <0.1 ng/mL

## 2023-03-17 LAB — RESPIRATORY PANEL BY PCR

## 2023-03-17 LAB — RESP PANEL BY RT-PCR (RSV, FLU A&B, COVID)  RVPGX2
Influenza A by PCR: NEGATIVE
Influenza B by PCR: NEGATIVE
Resp Syncytial Virus by PCR: NEGATIVE
SARS Coronavirus 2 by RT PCR: NEGATIVE

## 2023-03-17 LAB — TROPONIN I (HIGH SENSITIVITY)
Troponin I (High Sensitivity): 14 ng/L (ref <18)
Troponin I (High Sensitivity): 15 ng/L

## 2023-03-17 LAB — GROUP A STREP BY PCR: Group A Strep by PCR: NOT DETECTED

## 2023-03-17 MED ORDER — METOPROLOL SUCCINATE ER 25 MG PO TB24
25.0000 mg | ORAL_TABLET | Freq: Two times a day (BID) | ORAL | Status: DC
  Administered 2023-03-17 – 2023-03-19 (×3): 25 mg via ORAL
  Filled 2023-03-17 (×3): qty 1

## 2023-03-17 MED ORDER — SODIUM CHLORIDE 0.9 % IV SOLN
1.0000 g | Freq: Once | INTRAVENOUS | Status: AC
  Administered 2023-03-17: 1 g via INTRAVENOUS
  Filled 2023-03-17: qty 10

## 2023-03-17 MED ORDER — ACETAMINOPHEN 325 MG PO TABS
650.0000 mg | ORAL_TABLET | Freq: Four times a day (QID) | ORAL | Status: DC | PRN
  Administered 2023-03-19: 650 mg via ORAL
  Filled 2023-03-17: qty 2

## 2023-03-17 MED ORDER — ACETAMINOPHEN 650 MG RE SUPP: 650.0000 mg | Freq: Four times a day (QID) | RECTAL | Status: DC | PRN

## 2023-03-17 MED ORDER — SODIUM CHLORIDE 0.9% FLUSH
3.0000 mL | Freq: Two times a day (BID) | INTRAVENOUS | Status: DC
  Administered 2023-03-17 – 2023-03-19 (×5): 3 mL via INTRAVENOUS

## 2023-03-17 MED ORDER — ZOLPIDEM TARTRATE 5 MG PO TABS
5.0000 mg | ORAL_TABLET | Freq: Every evening | ORAL | Status: DC | PRN
  Administered 2023-03-18 (×2): 5 mg via ORAL
  Filled 2023-03-17 (×2): qty 1

## 2023-03-17 MED ORDER — POLYETHYLENE GLYCOL 3350 17 G PO PACK: 17.0000 g | PACK | Freq: Every day | ORAL | Status: DC | PRN

## 2023-03-17 MED ORDER — MIRABEGRON ER 50 MG PO TB24
50.0000 mg | ORAL_TABLET | Freq: Every day | ORAL | Status: DC
  Administered 2023-03-17 – 2023-03-18 (×2): 50 mg via ORAL
  Filled 2023-03-17 (×3): qty 1

## 2023-03-17 MED ORDER — APIXABAN 5 MG PO TABS
5.0000 mg | ORAL_TABLET | Freq: Two times a day (BID) | ORAL | Status: DC
  Administered 2023-03-17 – 2023-03-19 (×4): 5 mg via ORAL
  Filled 2023-03-17: qty 2
  Filled 2023-03-17 (×3): qty 1

## 2023-03-17 MED ORDER — DRONEDARONE HCL 400 MG PO TABS
400.0000 mg | ORAL_TABLET | Freq: Two times a day (BID) | ORAL | Status: DC
  Administered 2023-03-17 – 2023-03-19 (×4): 400 mg via ORAL
  Filled 2023-03-17 (×5): qty 1

## 2023-03-17 MED ORDER — ATORVASTATIN CALCIUM 40 MG PO TABS
40.0000 mg | ORAL_TABLET | Freq: Every day | ORAL | Status: DC
  Administered 2023-03-17 – 2023-03-18 (×2): 40 mg via ORAL
  Filled 2023-03-17 (×2): qty 1

## 2023-03-17 NOTE — H&P (Signed)
History and Physical   Kimberly Villegas UXL:[id number] DOB: [birth year] DOA: 03/17/2023  PCP: Chilton Greathouse, MD   Patient coming from: Home/ALF  Chief Complaint: Sore throat, shortness of breath, chest heaviness  HPI: Kimberly Villegas is a 88 y.o. female with medical history significant of hypertension, hyperlipidemia, GERD, CKD 3A, anemia, atrial fibrillation, CAD, macular degeneration, hearing loss, asthma, gout, insomnia presenting with ongoing sore throat, shortness of breath, chest heaviness.  Patient reports that she initially began to feel short of breath 3 days ago.  Yesterday she began to notice some worsening shortness of breath and chest heaviness which became worse this morning.  She denies fevers, chills, abdominal pain, constipation, diarrhea, nausea, vomiting.  ED Course: Vital signs in the ED notable for blood pressure in the 140s to 160s systolic.  Saturating 85% on room air but improved to the 90s on 2 L supplemental oxygen.  Lab workup included BMP with creatinine stable 1.32, glucose 1 1.  CBC with hemoglobin stable at 10.4.  Troponin negative x 2.  Respiratory panel for flu COVID RSV negative.  Group A strep screen negative.  Chest x-ray pending.  Patient received ceftriaxone in the ED.  Review of Systems: As per HPI otherwise all other systems reviewed and are negative.  Past Medical History:  Diagnosis Date   A-fib (HCC)    Anemia, chronic disease    Arthritis    Asthma    Atypical atrial flutter (HCC)    Bilateral carotid bruits    CAD (coronary artery disease)    Cervical stenosis of spine    Cough    COVID 05/25/2021   Diastolic dysfunction    DVT (deep venous thrombosis) (HCC)    after vein stripping years ago   GERD (gastroesophageal reflux disease)    Hyperlipemia    Hypertension    Insomnia    New onset a-fib (HCC) 2018   Seasonal allergies    Syncope    Vitamin D deficiency     Past Surgical History:  Procedure Laterality Date    AMPUTATION TOE Bilateral 07/25/2016   Procedure: bilateral 2nd toe amputations;  Surgeon: Toni Arthurs, MD;  Location: [facility];  Service: Orthopedics;  Laterality: Bilateral;   BASAL CELL CARCINOMA EXCISION     CARDIOVERSION N/A 09/10/2016   Procedure: CARDIOVERSION;  Surgeon: Yates Decamp, MD;  Location: [facility];  Service: Cardiovascular;  Laterality: N/A;   CARDIOVERSION N/A 07/29/2017   Procedure: CARDIOVERSION;  Surgeon: Elder Negus, MD;  Location: [facility] ENDOSCOPY;  Service: Cardiovascular;  Laterality: N/A;   CARDIOVERSION N/A 07/18/2020   Procedure: CARDIOVERSION;  Surgeon: Yates Decamp, MD;  Location: [facility] ENDOSCOPY;  Service: Cardiovascular;  Laterality: N/A;   CARDIOVERSION N/A 09/04/2021   Procedure: CARDIOVERSION;  Surgeon: Yates Decamp, MD;  Location: [facility] ENDOSCOPY;  Service: Cardiovascular;  Laterality: N/A;   FOOT SURGERY Left    JOINT REPLACEMENT Right    VEIN LIGATION AND STRIPPING Right     Social History  reports that she quit smoking about 54 years ago. Her smoking use included cigarettes. She started smoking about 59 years ago. She has a 1.3 pack-year smoking history. She has never used smokeless tobacco. She reports that she does not currently use alcohol after a past usage of about 1.0 standard drink of alcohol per week. She reports that she does not use drugs.  Allergies  Allergen Reactions   Vibramycin [Doxycycline] Other (See Comments)    Photosensitivity     Family History  Problem  Relation Age of Onset   Heart disease Mother    Stroke Mother    Colon cancer Father    Heart disease Brother    Cancer Brother    Healthy Child    Healthy Child    Healthy Child    Healthy Grandchild    Healthy Grandchild    Healthy Grandchild    Healthy Grandchild    Healthy Grandchild    Breast cancer Neg Hx    Reviewed on admission Prior to Admission medications   Medication Sig Start Date End Date Taking? Authorizing Provider  acetaminophen (TYLENOL)  325 MG tablet Take 650 mg by mouth every 6 (six) hours as needed for moderate pain (pain score 4-6), fever or headache.   Yes [provider]  apixaban (ELIQUIS) 5 MG TABS tablet Take 5 mg by mouth 2 (two) times daily after a meal.   Yes [provider]  atorvastatin (LIPITOR) 40 MG tablet TAKE 1 TABLET DAILY Patient taking differently: Take 40 mg by mouth at bedtime. 10/25/22  Yes Yates Decamp, MD  ipratropium (ATROVENT) 0.03 % nasal spray Place 2 sprays into both nostrils daily.   Yes [provider]  metoprolol succinate (TOPROL-XL) 25 MG 24 hr tablet Take 2 tablets (50 mg total) by mouth daily. Patient taking differently: Take 25 mg by mouth 2 (two) times daily. 09/23/22  Yes Yates Decamp, MD  mirabegron ER (MYRBETRIQ) 50 MG TB24 tablet Take 50 mg by mouth at bedtime.   Yes [provider]  MULTAQ 400 MG tablet TAKE 1 TABLET TWICE A DAY AFTER MEALS 06/24/22  Yes Yates Decamp, MD  Multiple Vitamins-Minerals (PRESERVISION AREDS 2) CAPS Take 1 capsule by mouth 2 (two) times daily.   Yes [provider]  NON FORMULARY Take 1-2 tablets by mouth See admin instructions. Bone strength supplement. Take 1 tablet in the morning and 2 tablets in the evening   Yes [provider]  OVER THE COUNTER MEDICATION Take 1-2 tablets by mouth See admin instructions. OTC Bone Strength Supplement. Take 1 tablet in the morning and 2 tablets after supper.   Yes [provider]  Probiotic Product (ALIGN) 4 MG CAPS Take 4 mg by mouth daily as needed (bloating).   Yes [provider]  Wheat Dextrin (BENEFIBER PO) Take 1 Scoop by mouth daily.   Yes [provider]  zaleplon (SONATA) 5 MG capsule Take 5 mg by mouth at bedtime as needed for sleep.   Yes [provider]    Physical Exam: Vitals:   03/17/23 0807 03/17/23 0809 03/17/23 1124 03/17/23 1517  BP:  (!) 166/62 (!) 144/58 (!) 154/58  Pulse:  84 81 78  Resp:  18 20 20   Temp:  98.5 F  (36.9 C) 98.4 F (36.9 C) 98.9 F (37.2 C)  TempSrc:  Oral Oral Oral  SpO2:  92% 97% 97%  Weight: 64 kg     Height: 5\' 5"  (1.651 m)       Physical Exam Constitutional:      General: She is not in acute distress.    Appearance: Normal appearance.  HENT:     Head: Normocephalic and atraumatic.     Mouth/Throat:     Mouth: Mucous membranes are moist.     Pharynx: Oropharynx is clear.  Eyes:     Extraocular Movements: Extraocular movements intact.     Pupils: Pupils are equal, round, and reactive to light.  Cardiovascular:     Rate and Rhythm: Normal  rate and regular rhythm.     Pulses: Normal pulses.     Heart sounds: Normal heart sounds.  Pulmonary:     Effort: Pulmonary effort is normal. No respiratory distress.     Breath sounds: Rales present.  Abdominal:     General: Bowel sounds are normal. There is no distension.     Palpations: Abdomen is soft.     Tenderness: There is no abdominal tenderness.  Musculoskeletal:        General: No swelling or deformity.  Skin:    General: Skin is warm and dry.  Neurological:     General: No focal deficit present.     Mental Status: Mental status is at baseline.    Labs on Admission: I have personally reviewed following labs and imaging studies  CBC: Recent Labs  Lab 03/17/23 0909  WBC 9.1  NEUTROABS 7.4  HGB 10.4*  HCT 31.8*  MCV 101.6*  PLT 161    Basic Metabolic Panel: Recent Labs  Lab 03/17/23 0909  NA 140  K 3.9  CL 109  CO2 26  GLUCOSE 101*  BUN 19  CREATININE 1.32*  CALCIUM 8.9    GFR: Estimated Creatinine Clearance: 27.5 mL/min (A) (by C-G formula based on SCr of 1.32 mg/dL (H)).  Liver Function Tests: No results for input(s): "AST", "ALT", "ALKPHOS", "BILITOT", "PROT", "ALBUMIN" in the last 168 hours.  Urine analysis: No results found for: "COLORURINE", "APPEARANCEUR", "LABSPEC", "PHURINE", "GLUCOSEU", "HGBUR", "BILIRUBINUR", "KETONESUR", "PROTEINUR", "UROBILINOGEN", "NITRITE",  "LEUKOCYTESUR"  Radiological Exams on Admission: No results found.  EKG: Independently reviewed.  Sinus rhythm at 85 bpm.  Low voltage multiple leads.  Nonspecific ST changes multiple leads.  QTc 500.  Assessment/Plan Principal Problem:   Acute respiratory failure with hypoxia (HCC) Active Problems:   Atrial fibrillation (HCC)   Asthma without status asthmaticus   Atherosclerotic heart disease of native coronary artery without angina pectoris   Chronic anemia   Chronic kidney disease, stage 3a (HCC)   Gastro-esophageal reflux disease without esophagitis   Hyperlipidemia   HTN (hypertension)   Macular degeneration   Pneumonia Acute respiratory failure with hypoxia > Presenting with sore throat, shortness of breath, chest pain worsening in the last few days. > No leukocytosis, chest x-ray came back showing mild edema with small effusions and left lower lobe atelectasis versus consolidation.  Now requiring 2 L of supplemental oxygen to maintain saturations. > Concern for pneumonia either viral or bacterial.  Negative for flu COVID and RSV in the ED.  Has received ceftriaxone in the ED.  No history of CHF, euvolemic. - Monitoring telemetry overnight - Continue on supple oxygen, wean as tolerated - Hold off on further antibiotics - Check full respiratory viral panel and procalcitonin to further delineate between bacterial and viral etiologies - Trend fever curve and WBC - Will add on BNP considering edema on chest x-ray, if elevated get echocardiogram - Supportive care  Atrial fibrillation/flutter > Has follow-up with cardiology. - Continue home dronedarone, metoprolol, and Eliquis  QT prolongation > QTc noted to be 500 in the ED. - On telemetry - Avoid additional QTc prolonging medications - A.m. EKG  Hypertension - Continue home metoprolol  Hyperlipidemia - Continue home atorvastatin  CKD 3A > Creatinine stable in the ED 1.32. - Trend BMP   Anemia > Hemoglobin  stable at 10.4 in ED.  Macrocytic. - Trend CBC  Insomnia - Replace home Sonata with formulary Ambien, low-dose  Macular degeneration Hearing loss Asthma Gout - Noted, no  active medications  DVT prophylaxis: Eliquis Code Status:   Full  Family Communication:  None on admission  Disposition Plan:   Patient is from:  Home/ALF  Anticipated DC to:  Same as above  Anticipated DC date:  1 to 2 days  Anticipated DC barriers: None  Consults called:  None Admission status:  Observation, telemetry  Severity of Illness: The appropriate patient status for this patient is OBSERVATION. Observation status is judged to be reasonable and necessary in order to provide the required intensity of service to ensure the patient's safety. The patient's presenting symptoms, physical exam findings, and initial radiographic and laboratory data in the context of their medical condition is felt to place them at decreased risk for further clinical deterioration. Furthermore, it is anticipated that the patient will be medically stable for discharge from the hospital within 2 midnights of admission.    Synetta Fail MD [facility]  How to contact the [facility] Attending or Consulting provider 7A - 7P or covering provider during after hours 7P -7A, for this patient?   Check the care team in [facility] and look for a) attending/consulting [facility] provider listed and b) the [facility] team listed Log into [email or web address] and use [place]'s universal password to access. If you do not have the password, please contact the hospital operator. Locate the [facility] provider you are looking for under [facility] and page to a number that you can be directly reached. If you still have difficulty reaching the provider, please page the [facility] (Director on Call) for the Hospitalists listed on amion for assistance.  03/17/2023, 3:34 PM

## 2023-03-17 NOTE — ED Provider Notes (Signed)
Emergency Department Provider Note   I have reviewed the triage vital signs and the nursing notes.   HISTORY  Chief Complaint Sore Throat and Cough   HPI Kimberly Villegas is a 88 y.o. female with past history reviewed below including A-fib on Eliquis presents to the emergency department for evaluation of sore throat, cough/congestion.  She has had some associated chest discomfort over the past 24 hours with shortness of breath, worse with exertion.  She is currently at an assisted living facility and arrives by EMS.  Chest pain is central, nonradiating.   Past Medical History:  Diagnosis Date   A-fib (HCC)    Anemia, chronic disease    Arthritis    Asthma    Bilateral carotid bruits    CAD (coronary artery disease)    Cervical stenosis of spine    Cough    Diastolic dysfunction    DVT (deep venous thrombosis) (HCC)    after vein stripping years ago   GERD (gastroesophageal reflux disease)    Hyperlipemia    Hypertension    Insomnia    New onset a-fib (HCC) 2018   Seasonal allergies    Syncope    Vitamin D deficiency     Review of Systems  Constitutional: No fever/chills Cardiovascular: Positive chest pain. Respiratory: Positive shortness of breath and cough.  Gastrointestinal: No abdominal pain.  No nausea, no vomiting.  Genitourinary: Negative for dysuria. Musculoskeletal: Negative for back pain. Skin: Negative for rash. Neurological: Negative for headaches.   ____________________________________________   PHYSICAL EXAM:  VITAL SIGNS: ED Triage Vitals  Encounter Vitals Group     BP 03/17/23 0809 (!) 166/62     Pulse Rate 03/17/23 0809 84     Resp 03/17/23 0809 18     Temp 03/17/23 0809 98.5 F (36.9 C)     Temp Source 03/17/23 0809 Oral     SpO2 03/17/23 0809 92 %     Weight 03/17/23 0807 141 lb 1.5 oz (64 kg)     Height 03/17/23 0807 5\' 5"  (1.651 m)   Constitutional: Alert and oriented. Well appearing and in no acute distress. Eyes:  Conjunctivae are normal.  Head: Atraumatic. Nose: Mild congestion/rhinnorhea. Mouth/Throat: Mucous membranes are moist.  Oropharynx with mild erythema.  Neck: No stridor.   Cardiovascular: Normal rate, regular rhythm. Good peripheral circulation. Grossly normal heart sounds.   Respiratory: Normal respiratory effort.  No retractions. Lungs CTAB. Gastrointestinal: Soft and nontender. No distention.  Musculoskeletal: No gross deformities of extremities. Neurologic:  Normal speech and language.  Skin:  Skin is warm, dry and intact. No rash noted.  ____________________________________________   LABS (all labs ordered are listed, but only abnormal results are displayed)  Labs Reviewed  RESP PANEL BY RT-PCR (RSV, FLU A&B, COVID)  [id number]  GROUP A STREP BY PCR  BASIC METABOLIC PANEL  CBC WITH DIFFERENTIAL/PLATELET  TROPONIN I (HIGH SENSITIVITY)   ____________________________________________  EKG  Rate: 85 PR: 184 QTc: 500  Sinus rhythm. Narrow QRS. No ST elevation or depression. No STEMI.  ____________________________________________  RADIOLOGY  No results found.  ____________________________________________   PROCEDURES  Procedure(s) performed:   Procedures   ____________________________________________   INITIAL IMPRESSION / ASSESSMENT AND PLAN / ED COURSE  Pertinent labs & imaging results that were available during my care of the patient were reviewed by me and considered in my medical decision making (see chart for details).   This patient is Presenting for Evaluation of CP, which does require a range  of treatment options, and is a complaint that involves a high risk of morbidity and mortality.  The Differential Diagnoses includes but is not exclusive to acute coronary syndrome, aortic dissection, pulmonary embolism, cardiac tamponade, community-acquired pneumonia, pericarditis, musculoskeletal chest wall pain, etc.   Critical Interventions-    Medications -  No data to display  Reassessment after intervention:     Clinical Laboratory Tests Ordered, included ***  Radiologic Tests Ordered, included CXR. I independently interpreted the images and agree with radiology interpretation.   Cardiac Monitor Tracing which shows NSR.    Social Determinants of Health Risk patient is not an active smoker.   Consult complete with  Medical Decision Making: Summary:  The patient presents emergency department female with flulike symptoms including cough and shortness of breath.  She has some associated chest pain, borderline low O2 sats at rest.  Differential is broad as above.  Lower suspicion for PE as the patient is on Eliquis and compliant with this medication.  Plan for screening chest x-ray, labs.  She does not appear acutely volume overloaded.  Reevaluation with update and discussion with   ***Considered admission***  Patient's presentation is most consistent with acute presentation with potential threat to life or bodily function.   Disposition:   ____________________________________________  FINAL CLINICAL IMPRESSION(S) / ED DIAGNOSES  Final diagnoses:  None     NEW OUTPATIENT MEDICATIONS STARTED DURING THIS VISIT:  New Prescriptions   No medications on file    Note:  This document was prepared using Dragon voice recognition software and may include unintentional dictation errors.  Alona Bene, MD, [facility]

## 2023-03-17 NOTE — ED Notes (Signed)
ED TO INPATIENT HANDOFF REPORT  ED Nurse Name and Phone #: Vernona Rieger [phone]  S Name/Age/Gender Kimberly Villegas 88 y.o. female Room/Bed: 040C/040C  Code Status   Code Status: Full Code  Home/SNF/Other Skilled nursing facility Patient oriented to: self, place, time, and situation Is this baseline? Yes   Triage Complete: Triage complete  Chief Complaint Acute respiratory failure with hypoxia (HCC) [J96.01]  Triage Note Patient arrives by EMS from assisted living facility with c/o sore throat, non-productive cough accompanied by chest wall pain.  That started on Friday.   Patient reports shortness of breath and chest heaviness that began yesterday and was worse this morning when she got up.    EMS VITALS: BP:  160/72 HR 82 RR 22 O2: 98% on RA.    Allergies Allergies  Allergen Reactions   Vibramycin [Doxycycline] Other (See Comments)    Photosensitivity     Level of Care/Admitting Diagnosis ED Disposition     ED Disposition  Admit   Condition  --   Comment  Hospital Area: [facility] [[id number]]  Level of Care: Telemetry Medical [104]  May place patient in observation at [facility] or [facility] if equivalent level of care is available:: No  Covid Evaluation: Asymptomatic - no recent exposure (last 10 days) testing not required  Diagnosis: Acute respiratory failure with hypoxia [facility]) [[id number]]  Admitting Physician: Synetta Fail [[id number]]  Attending Physician: Synetta Fail [phone]          B Medical/Surgery History Past Medical History:  Diagnosis Date   A-fib (HCC)    Anemia, chronic disease    Arthritis    Asthma    Atypical atrial flutter (HCC)    Bilateral carotid bruits    CAD (coronary artery disease)    Cervical stenosis of spine    Cough    COVID 05/25/2021   Diastolic dysfunction    DVT (deep venous thrombosis) (HCC)    after vein stripping years ago   GERD (gastroesophageal reflux disease)    Hyperlipemia     Hypertension    Insomnia    New onset a-fib (HCC) 2018   Seasonal allergies    Syncope    Vitamin D deficiency    Past Surgical History:  Procedure Laterality Date   AMPUTATION TOE Bilateral 07/25/2016   Procedure: bilateral 2nd toe amputations;  Surgeon: Toni Arthurs, MD;  Location: [facility];  Service: Orthopedics;  Laterality: Bilateral;   BASAL CELL CARCINOMA EXCISION     CARDIOVERSION N/A 09/10/2016   Procedure: CARDIOVERSION;  Surgeon: Yates Decamp, MD;  Location: [facility] ENDOSCOPY;  Service: Cardiovascular;  Laterality: N/A;   CARDIOVERSION N/A 07/29/2017   Procedure: CARDIOVERSION;  Surgeon: Elder Negus, MD;  Location: [facility] ENDOSCOPY;  Service: Cardiovascular;  Laterality: N/A;   CARDIOVERSION N/A 07/18/2020   Procedure: CARDIOVERSION;  Surgeon: Yates Decamp, MD;  Location: [facility] ENDOSCOPY;  Service: Cardiovascular;  Laterality: N/A;   CARDIOVERSION N/A 09/04/2021   Procedure: CARDIOVERSION;  Surgeon: Yates Decamp, MD;  Location: [facility] ENDOSCOPY;  Service: Cardiovascular;  Laterality: N/A;   FOOT SURGERY Left    JOINT REPLACEMENT Right    VEIN LIGATION AND STRIPPING Right      A IV Location/Drains/Wounds Patient Lines/Drains/Airways Status     Active Line/Drains/Airways     Name Placement date Placement time Site Days   Peripheral IV 03/17/23 20 G Left Antecubital 03/17/23  0919  Antecubital  less than 1  Intake/Output Last 24 hours  Intake/Output Summary (Last 24 hours) at 03/17/2023 1641 Last data filed at 03/17/2023 1605 Gross per 24 hour  Intake 100 ml  Output --  Net 100 ml    Labs/Imaging Results for orders placed or performed during the hospital encounter of 03/17/23 (from the past 48 hour(s))  Basic metabolic panel     Status: Abnormal   Collection Time: 03/17/23  9:09 AM  Result Value Ref Range   Sodium 140 135 - 145 mmol/L   Potassium 3.9 3.5 - 5.1 mmol/L   Chloride 109 98 - 111 mmol/L   CO2 26 22 - 32 mmol/L   Glucose, Bld 101  (H) 70 - 99 mg/dL    Comment: Glucose reference range applies only to samples taken after fasting for at least 8 hours.   BUN 19 8 - 23 mg/dL   Creatinine, Ser 1.02 (H) 0.44 - 1.00 mg/dL   Calcium 8.9 8.9 - 72.5 mg/dL   GFR, Estimated 39 (L) >60 mL/min    Comment: (NOTE) Calculated using the CKD-EPI Creatinine Equation (2021)    Anion gap 5 5 - 15    Comment: Performed at [facility], [address], [place], [place] [address]  CBC with Differential     Status: Abnormal   Collection Time: 03/17/23  9:09 AM  Result Value Ref Range   WBC 9.1 4.0 - 10.5 K/uL   RBC 3.13 (L) 3.87 - 5.11 MIL/uL   Hemoglobin 10.4 (L) 12.0 - 15.0 g/dL   HCT 03.4 (L) 74.2 - 59.5 %   MCV 101.6 (H) 80.0 - 100.0 fL   MCH 33.2 26.0 - 34.0 pg   MCHC 32.7 30.0 - 36.0 g/dL   RDW 63.8 75.6 - 43.3 %   Platelets 161 150 - 400 K/uL   nRBC 0.0 0.0 - 0.2 %   Neutrophils Relative % 81 %   Neutro Abs 7.4 1.7 - 7.7 K/uL   Lymphocytes Relative 9 %   Lymphs Abs 0.8 0.7 - 4.0 K/uL   Monocytes Relative 7 %   Monocytes Absolute 0.6 0.1 - 1.0 K/uL   Eosinophils Relative 2 %   Eosinophils Absolute 0.2 0.0 - 0.5 K/uL   Basophils Relative 0 %   Basophils Absolute 0.0 0.0 - 0.1 K/uL   Immature Granulocytes 1 %   Abs Immature Granulocytes 0.05 0.00 - 0.07 K/uL    Comment: Performed at [facility], [address], [place], [place] [address]  Troponin I (High Sensitivity)     Status: None   Collection Time: 03/17/23  9:09 AM  Result Value Ref Range   Troponin I (High Sensitivity) 14 <18 ng/L    Comment: (NOTE) Elevated high sensitivity troponin I (hsTnI) values and significant  changes across serial measurements may suggest ACS but many other  chronic and acute conditions are known to elevate hsTnI results.  Refer to the "Links" section for chest pain algorithms and additional  guidance. Performed at [facility], [address], [place], [place] [address]   Resp panel by RT-PCR (RSV, Flu A&B, Covid)  Anterior Nasal Swab     Status: None   Collection Time: 03/17/23  9:09 AM   Specimen: Anterior Nasal Swab  Result Value Ref Range   SARS Coronavirus 2 by RT PCR NEGATIVE NEGATIVE   Influenza A by PCR NEGATIVE NEGATIVE   Influenza B by PCR NEGATIVE NEGATIVE    Comment: (NOTE) The Xpert Xpress SARS-CoV-2/FLU/RSV plus assay  is intended as an aid in the diagnosis of influenza from Nasopharyngeal swab specimens and should not be used as a sole basis for treatment. Nasal washings and aspirates are unacceptable for Xpert Xpress SARS-CoV-2/FLU/RSV testing.  Fact Sheet for Patients: BloggerCourse.com  Fact Sheet for Healthcare Providers: SeriousBroker.it  This test is not yet approved or cleared by the [place] FDA and has been authorized for detection and/or diagnosis of SARS-CoV-2 by FDA under an Emergency Use Authorization (EUA). This EUA will remain in effect (meaning this test can be used) for the duration of the COVID-19 declaration under Section 564(b)(1) of the Act, 21 U.S.C. section 360bbb-3(b)(1), unless the authorization is terminated or revoked.     Resp Syncytial Virus by PCR NEGATIVE NEGATIVE    Comment: (NOTE) Fact Sheet for Patients: BloggerCourse.com  Fact Sheet for Healthcare Providers: SeriousBroker.it  This test is not yet approved or cleared by the [place] FDA and has been authorized for detection and/or diagnosis of SARS-CoV-2 by FDA under an Emergency Use Authorization (EUA). This EUA will remain in effect (meaning this test can be used) for the duration of the COVID-19 declaration under Section 564(b)(1) of the Act, 21 U.S.C. section 360bbb-3(b)(1), unless the authorization is terminated or revoked.  Performed at [facility], [address] [place], [place] [address]   Group A Strep by PCR     Status: None   Collection Time: 03/17/23   9:09 AM   Specimen: Anterior Nasal Swab; Sterile Swab  Result Value Ref Range   Group A Strep by PCR NOT DETECTED NOT DETECTED    Comment: Performed at [facility], [address], [place], [place] [address]  Troponin I (High Sensitivity)     Status: None   Collection Time: 03/17/23 11:58 AM  Result Value Ref Range   Troponin I (High Sensitivity) 15 <18 ng/L    Comment: (NOTE) Elevated high sensitivity troponin I (hsTnI) values and significant  changes across serial measurements may suggest ACS but many other  chronic and acute conditions are known to elevate hsTnI results.  Refer to the "Links" section for chest pain algorithms and additional  guidance. Performed at [facility], [address], [place], [place] [address]    DG Chest 2 View  Result Date: 03/17/2023 CLINICAL DATA:  Cough and chest pain. EXAM: CHEST - 2 VIEW COMPARISON:  08/26/2022. FINDINGS: Mild pulmonary vascular congestion. There is new left retrocardiac opacities, which may represent combination of left lung lower lobe atelectasis and/or consolidation with small left pleural effusion. There is also small right pleural effusion. No pneumothorax. Stable cardio-mediastinal silhouette. No acute osseous abnormalities. The soft tissues are within normal limits. IMPRESSION: *Mild pulmonary edema. *Bilateral small pleural effusions. There is also left lung lower lobe atelectasis and/or consolidation. Electronically Signed   By: Jules Schick M.D.   On: 03/17/2023 15:51    Pending Labs Unresulted Labs (From admission, onward)     Start     Ordered   03/18/23 0500  Comprehensive metabolic panel  Tomorrow morning,   R        03/17/23 1533   03/18/23 0500  CBC  Tomorrow morning,   R        03/17/23 1533   03/17/23 1534  Procalcitonin  Daily,   R     References:    Procalcitonin Lower Respiratory Tract Infection AND Sepsis Procalcitonin Algorithm   03/17/23 1533   03/17/23 1531  Respiratory (~20 pathogens)  panel  by PCR  (Respiratory panel by PCR (~20 pathogens, ~24 hr TAT)  w precautions)  Once,   R        03/17/23 1533            Vitals/Pain Today's Vitals   03/17/23 1124 03/17/23 1517 03/17/23 1630 03/17/23 1631  BP: (!) 144/58 (!) 154/58 (!) 160/65   Pulse: 81 78 85   Resp: 20 20 16    Temp: 98.4 F (36.9 C) 98.9 F (37.2 C)  97.9 F (36.6 C)  TempSrc: Oral Oral  Oral  SpO2: 97% 97% 97%   Weight:      Height:      PainSc:        Isolation Precautions Droplet precaution  Medications Medications  dronedarone (MULTAQ) tablet 400 mg (has no administration in time range)  metoprolol succinate (TOPROL-XL) 24 hr tablet 25 mg (has no administration in time range)  atorvastatin (LIPITOR) tablet 40 mg (has no administration in time range)  zolpidem (AMBIEN) tablet 5 mg (has no administration in time range)  mirabegron ER (MYRBETRIQ) tablet 50 mg (has no administration in time range)  apixaban (ELIQUIS) tablet 5 mg (has no administration in time range)  sodium chloride flush (NS) 0.9 % injection 3 mL (3 mLs Intravenous Given 03/17/23 1624)  acetaminophen (TYLENOL) tablet 650 mg (has no administration in time range)    Or  acetaminophen (TYLENOL) suppository 650 mg (has no administration in time range)  polyethylene glycol (MIRALAX / GLYCOLAX) packet 17 g (has no administration in time range)  cefTRIAXone (ROCEPHIN) 1 g in sodium chloride 0.9 % 100 mL IVPB (0 g Intravenous Stopped 03/17/23 1605)    Mobility walks     Focused Assessments Pulmonary Assessment Handoff:  Lung sounds:   O2 Device: Nasal Cannula O2 Flow Rate (L/min): 3 L/min    R Recommendations: See Admitting Provider Note  Report given to:   Additional Notes: Hasn't ambulated in ED

## 2023-03-17 NOTE — ED Notes (Signed)
Nurse at bedside drawing blood.  Noted Pulse ox going down to 85-90% on RA.   Patient placed on nasal cannula 3L to bring O2 levels above 90%.

## 2023-03-17 NOTE — ED Triage Notes (Addendum)
Patient arrives by EMS from assisted living facility with c/o sore throat, non-productive cough accompanied by chest wall pain.  That started on Friday.   Patient reports shortness of breath and chest heaviness that began yesterday and was worse this morning when she got up.    EMS VITALS: BP:  160/72 HR 82 RR 22 O2: 98% on RA.

## 2023-03-18 DIAGNOSIS — D631 Anemia in chronic kidney disease: Secondary | ICD-10-CM | POA: Diagnosis present

## 2023-03-18 DIAGNOSIS — N1831 Chronic kidney disease, stage 3a: Secondary | ICD-10-CM | POA: Diagnosis present

## 2023-03-18 DIAGNOSIS — I4891 Unspecified atrial fibrillation: Secondary | ICD-10-CM | POA: Diagnosis present

## 2023-03-18 DIAGNOSIS — J90 Pleural effusion, not elsewhere classified: Secondary | ICD-10-CM | POA: Diagnosis present

## 2023-03-18 DIAGNOSIS — Z1152 Encounter for screening for COVID-19: Secondary | ICD-10-CM | POA: Diagnosis not present

## 2023-03-18 DIAGNOSIS — I129 Hypertensive chronic kidney disease with stage 1 through stage 4 chronic kidney disease, or unspecified chronic kidney disease: Secondary | ICD-10-CM | POA: Diagnosis present

## 2023-03-18 DIAGNOSIS — B348 Other viral infections of unspecified site: Secondary | ICD-10-CM | POA: Diagnosis not present

## 2023-03-18 DIAGNOSIS — Z79899 Other long term (current) drug therapy: Secondary | ICD-10-CM | POA: Diagnosis not present

## 2023-03-18 DIAGNOSIS — H9190 Unspecified hearing loss, unspecified ear: Secondary | ICD-10-CM | POA: Diagnosis present

## 2023-03-18 DIAGNOSIS — Z8616 Personal history of COVID-19: Secondary | ICD-10-CM | POA: Diagnosis not present

## 2023-03-18 DIAGNOSIS — Z85828 Personal history of other malignant neoplasm of skin: Secondary | ICD-10-CM | POA: Diagnosis not present

## 2023-03-18 DIAGNOSIS — Z87891 Personal history of nicotine dependence: Secondary | ICD-10-CM | POA: Diagnosis not present

## 2023-03-18 DIAGNOSIS — K219 Gastro-esophageal reflux disease without esophagitis: Secondary | ICD-10-CM | POA: Diagnosis present

## 2023-03-18 DIAGNOSIS — Z7901 Long term (current) use of anticoagulants: Secondary | ICD-10-CM | POA: Diagnosis not present

## 2023-03-18 DIAGNOSIS — I480 Paroxysmal atrial fibrillation: Secondary | ICD-10-CM | POA: Diagnosis not present

## 2023-03-18 DIAGNOSIS — H3530 Unspecified macular degeneration: Secondary | ICD-10-CM | POA: Diagnosis present

## 2023-03-18 DIAGNOSIS — G4700 Insomnia, unspecified: Secondary | ICD-10-CM | POA: Diagnosis present

## 2023-03-18 DIAGNOSIS — J1289 Other viral pneumonia: Secondary | ICD-10-CM | POA: Diagnosis present

## 2023-03-18 DIAGNOSIS — R072 Precordial pain: Secondary | ICD-10-CM | POA: Diagnosis present

## 2023-03-18 DIAGNOSIS — Z823 Family history of stroke: Secondary | ICD-10-CM | POA: Diagnosis not present

## 2023-03-18 DIAGNOSIS — J45909 Unspecified asthma, uncomplicated: Secondary | ICD-10-CM | POA: Diagnosis present

## 2023-03-18 DIAGNOSIS — I2510 Atherosclerotic heart disease of native coronary artery without angina pectoris: Secondary | ICD-10-CM | POA: Diagnosis present

## 2023-03-18 DIAGNOSIS — E785 Hyperlipidemia, unspecified: Secondary | ICD-10-CM | POA: Diagnosis present

## 2023-03-18 DIAGNOSIS — J9601 Acute respiratory failure with hypoxia: Secondary | ICD-10-CM | POA: Diagnosis present

## 2023-03-18 DIAGNOSIS — B9789 Other viral agents as the cause of diseases classified elsewhere: Secondary | ICD-10-CM | POA: Diagnosis present

## 2023-03-18 DIAGNOSIS — I4892 Unspecified atrial flutter: Secondary | ICD-10-CM | POA: Diagnosis present

## 2023-03-18 DIAGNOSIS — M109 Gout, unspecified: Secondary | ICD-10-CM | POA: Diagnosis present

## 2023-03-18 LAB — COMPREHENSIVE METABOLIC PANEL
ALT: 15 U/L (ref 0–44)
AST: 19 U/L (ref 15–41)
Albumin: 2.7 g/dL — ABNORMAL LOW (ref 3.5–5.0)
Alkaline Phosphatase: 54 U/L (ref 38–126)
Anion gap: 7 (ref 5–15)
BUN: 14 mg/dL (ref 8–23)
CO2: 23 mmol/L (ref 22–32)
Calcium: 8.5 mg/dL — ABNORMAL LOW (ref 8.9–10.3)
Chloride: 105 mmol/L (ref 98–111)
Creatinine, Ser: 1.03 mg/dL — ABNORMAL HIGH (ref 0.44–1.00)
GFR, Estimated: 53 mL/min — ABNORMAL LOW (ref >60)
Glucose, Bld: 90 mg/dL (ref 70–99)
Potassium: 3.8 mmol/L (ref 3.5–5.1)
Sodium: 135 mmol/L (ref 135–145)
Total Bilirubin: 1.4 mg/dL — ABNORMAL HIGH (ref <1.2)
Total Protein: 5.8 g/dL — ABNORMAL LOW (ref 6.5–8.1)

## 2023-03-18 LAB — CBC
HCT: 28.3 % — ABNORMAL LOW (ref 36.0–46.0)
Hemoglobin: 9.3 g/dL — ABNORMAL LOW (ref 12.0–15.0)
MCH: 32.6 pg (ref 26.0–34.0)
MCHC: 32.9 g/dL (ref 30.0–36.0)
MCV: 99.3 fL (ref 80.0–100.0)
Platelets: 151 10*3/uL (ref 150–400)
RBC: 2.85 MIL/uL — ABNORMAL LOW (ref 3.87–5.11)
RDW: 14.9 % (ref 11.5–15.5)
WBC: 7.8 10*3/uL (ref 4.0–10.5)
nRBC: 0 % (ref 0.0–0.2)

## 2023-03-18 LAB — PROCALCITONIN: Procalcitonin: <0.1 ng/mL

## 2023-03-18 NOTE — Progress Notes (Signed)
PROGRESS NOTE                                                                                                                                                                                                             Patient Demographics:    Kimberly Villegas, is a 88 y.o. female, DOB - [birth year], WGN:[id number]  Outpatient Primary MD for the patient is Avva, Ravisankar, MD    LOS - 0  Admit date - 03/17/2023    Chief Complaint  Patient presents with   Sore Throat   Cough       Brief Narrative (HPI from H&P)   88 y.o. female with medical history significant of hypertension, hyperlipidemia, GERD, CKD 3A, anemia, atrial fibrillation, CAD, macular degeneration, hearing loss, asthma, gout, insomnia presenting with ongoing sore throat, shortness of breath, chest heaviness.  Was diagnosed with rhinovirus pneumonia and admitted.   Subjective:    Kimberly Villegas today has, No headache, No chest pain, No abdominal pain - No Nausea, No new weakness tingling or numbness, improved cough and shortness of breath   Assessment  & Plan :    Acute respiratory failure with hypoxia due to rhinovirus pneumonia. > Presenting with sore throat, shortness of breath, dry cough and some atypical chest pain with cough, cultures positive for rhinovirus pneumonia, continue supportive care, encouraged to sit in chair use I-S and flutter valve, advance activity titrate on oxygen.  Already feeling much better likely discharge in the next 1 to 2 days.   Atrial fibrillation/flutter > Has follow-up with cardiology. - Continue home dronedarone, metoprolol, and Eliquis   QT prolongation > QTc noted to be 500 in the ED. - On telemetry - Avoid additional QTc prolonging medications -Repeat EKG on 03/18/2023 shows QTc improved to 480 ms, continue beta-blocker, likely mild prolongation due to her being on dronedarone.   Hypertension - Continue home  metoprolol   Hyperlipidemia - Continue home atorvastatin   CKD 3A > Creatinine stable in the ED 1.32. - Trend BMP   Anemia > Hemoglobin stable at 10.4 in ED.  Macrocytic. - Trend CBC   Insomnia - Replace home Sonata with formulary Ambien, low-dose         Condition - Fair  Family Communication  :  None  Code Status :  Full  Consults  :  None  PUD Prophylaxis :    Procedures  :            Disposition Plan  :    Status is: Observation   DVT Prophylaxis  :     apixaban (ELIQUIS) tablet 5 mg     Lab Results  Component Value Date   PLT 151 03/18/2023    Diet :  Diet Order             Diet regular Room service appropriate? Yes; Fluid consistency: Thin  Diet effective now                    Inpatient Medications  Scheduled Meds:  apixaban  5 mg Oral BID PC   atorvastatin  40 mg Oral QHS   dronedarone  400 mg Oral BID WC   metoprolol succinate  25 mg Oral BID   mirabegron ER  50 mg Oral QHS   sodium chloride flush  3 mL Intravenous Q12H   Continuous Infusions: PRN Meds:.acetaminophen **OR** acetaminophen, polyethylene glycol, zolpidem  Antibiotics  :    Anti-infectives (From admission, onward)    Start     Dose/Rate Route Frequency Ordered Stop   03/17/23 1515  cefTRIAXone (ROCEPHIN) 1 g in sodium chloride 0.9 % 100 mL IVPB        1 g 200 mL/hr over 30 Minutes Intravenous  Once 03/17/23 1507 03/17/23 1605         Objective:   Vitals:   03/17/23 1949 03/18/23 0010 03/18/23 0400 03/18/23 0753  BP: (!) 170/66 134/63 (!) 132/57 130/89  Pulse:  88 83 87  Resp:  18 20 17   Temp: 98.3 F (36.8 C) 98.1 F (36.7 C) 98.2 F (36.8 C) 98.2 F (36.8 C)  TempSrc: Oral Oral Oral Oral  SpO2:  95% 96% 99%  Weight:      Height:        Wt Readings from Last 3 Encounters:  03/17/23 64 kg  03/10/23 63.9 kg  12/04/22 63 kg     Intake/Output Summary (Last 24 hours) at 03/18/2023 0959 Last data filed at 03/18/2023 0500 Gross per 24  hour  Intake 340 ml  Output 800 ml  Net -460 ml     Physical Exam  Awake Alert, No new F.N deficits, Normal affect [place].AT,PERRAL Supple Neck, No JVD,   Symmetrical Chest wall movement, Good air movement bilaterally, CTAB RRR,No Gallops,Rubs or new Murmurs,  +ve B.Sounds, Abd Soft, No tenderness,   No Cyanosis, Clubbing or edema        Data Review:    Recent Labs  Lab 03/17/23 0909 03/18/23 0349  WBC 9.1 7.8  HGB 10.4* 9.3*  HCT 31.8* 28.3*  PLT 161 151  MCV 101.6* 99.3  MCH 33.2 32.6  MCHC 32.7 32.9  RDW 15.1 14.9  LYMPHSABS 0.8  --   MONOABS 0.6  --   EOSABS 0.2  --   BASOSABS 0.0  --     Recent Labs  Lab 03/17/23 0909 03/17/23 1624 03/18/23 0349  NA 140  --  135  K 3.9  --  3.8  CL 109  --  105  CO2 26  --  23  ANIONGAP 5  --  7  GLUCOSE 101*  --  90  BUN 19  --  14  CREATININE 1.32*  --  1.03*  AST  --   --  19  ALT  --   --  15  ALKPHOS  --   --  54  BILITOT  --   --  1.4*  ALBUMIN  --   --  2.7*  PROCALCITON  --  <0.10 <0.10  CALCIUM 8.9  --  8.5*    Micro Results Recent Results (from the past 240 hour(s))  Resp panel by RT-PCR (RSV, Flu A&B, Covid) Anterior Nasal Swab     Status: None   Collection Time: 03/17/23  9:09 AM   Specimen: Anterior Nasal Swab  Result Value Ref Range Status   SARS Coronavirus 2 by RT PCR NEGATIVE NEGATIVE Final   Influenza A by PCR NEGATIVE NEGATIVE Final   Influenza B by PCR NEGATIVE NEGATIVE Final    Comment: (NOTE) The Xpert Xpress SARS-CoV-2/FLU/RSV plus assay is intended as an aid in the diagnosis of influenza from Nasopharyngeal swab specimens and should not be used as a sole basis for treatment. Nasal washings and aspirates are unacceptable for Xpert Xpress SARS-CoV-2/FLU/RSV testing.  Fact Sheet for Patients: BloggerCourse.com  Fact Sheet for Healthcare Providers: SeriousBroker.it  This test is not yet approved or cleared by the [place] FDA  and has been authorized for detection and/or diagnosis of SARS-CoV-2 by FDA under an Emergency Use Authorization (EUA). This EUA will remain in effect (meaning this test can be used) for the duration of the COVID-19 declaration under Section 564(b)(1) of the Act, 21 U.S.C. section 360bbb-3(b)(1), unless the authorization is terminated or revoked.     Resp Syncytial Virus by PCR NEGATIVE NEGATIVE Final    Comment: (NOTE) Fact Sheet for Patients: BloggerCourse.com  Fact Sheet for Healthcare Providers: SeriousBroker.it  This test is not yet approved or cleared by the [place] FDA and has been authorized for detection and/or diagnosis of SARS-CoV-2 by FDA under an Emergency Use Authorization (EUA). This EUA will remain in effect (meaning this test can be used) for the duration of the COVID-19 declaration under Section 564(b)(1) of the Act, 21 U.S.C. section 360bbb-3(b)(1), unless the authorization is terminated or revoked.  Performed at [facility], [address] [place], [place] [address]   Group A Strep by PCR     Status: None   Collection Time: 03/17/23  9:09 AM   Specimen: Anterior Nasal Swab; Sterile Swab  Result Value Ref Range Status   Group A Strep by PCR NOT DETECTED NOT DETECTED Final    Comment: Performed at [facility], [address] [place], [place] [address]  Respiratory (~20 pathogens) panel by PCR     Status: Abnormal   Collection Time: 03/17/23  5:37 PM   Specimen: Nasopharyngeal Swab; Respiratory  Result Value Ref Range Status   Adenovirus NOT DETECTED NOT DETECTED Final   Coronavirus 229E NOT DETECTED NOT DETECTED Final    Comment: (NOTE) The Coronavirus on the Respiratory Panel, DOES NOT test for the novel  Coronavirus (2019 nCoV)    Coronavirus HKU1 NOT DETECTED NOT DETECTED Final   Coronavirus NL63 NOT DETECTED NOT DETECTED Final   Coronavirus OC43 NOT DETECTED NOT DETECTED Final    Metapneumovirus NOT DETECTED NOT DETECTED Final   Rhinovirus / Enterovirus DETECTED (A) NOT DETECTED Final   Influenza A NOT DETECTED NOT DETECTED Final   Influenza B NOT DETECTED NOT DETECTED Final   Parainfluenza Virus 1 NOT DETECTED NOT DETECTED Final   Parainfluenza Virus 2 NOT DETECTED NOT DETECTED Final   Parainfluenza Virus 3 NOT DETECTED NOT DETECTED Final   Parainfluenza Virus 4 NOT DETECTED NOT DETECTED Final   Respiratory Syncytial Virus NOT DETECTED NOT  DETECTED Final   Bordetella pertussis NOT DETECTED NOT DETECTED Final   Bordetella Parapertussis NOT DETECTED NOT DETECTED Final   Chlamydophila pneumoniae NOT DETECTED NOT DETECTED Final   Mycoplasma pneumoniae NOT DETECTED NOT DETECTED Final    Comment: Performed at [facility], [address] [place], [place] [address]    Radiology Reports DG Chest 2 View  Result Date: 03/17/2023 CLINICAL DATA:  Cough and chest pain. EXAM: CHEST - 2 VIEW COMPARISON:  08/26/2022. FINDINGS: Mild pulmonary vascular congestion. There is new left retrocardiac opacities, which may represent combination of left lung lower lobe atelectasis and/or consolidation with small left pleural effusion. There is also small right pleural effusion. No pneumothorax. Stable cardio-mediastinal silhouette. No acute osseous abnormalities. The soft tissues are within normal limits. IMPRESSION: *Mild pulmonary edema. *Bilateral small pleural effusions. There is also left lung lower lobe atelectasis and/or consolidation. Electronically Signed   By: Jules Schick M.D.   On: 03/17/2023 15:51      Signature  -   Susa Raring M.D on 03/18/2023 at 9:59 AM   -  To page go to [email or web address]

## 2023-03-19 ENCOUNTER — Other Ambulatory Visit (HOSPITAL_COMMUNITY): Payer: Self-pay

## 2023-03-19 DIAGNOSIS — B348 Other viral infections of unspecified site: Secondary | ICD-10-CM

## 2023-03-19 DIAGNOSIS — I480 Paroxysmal atrial fibrillation: Secondary | ICD-10-CM | POA: Diagnosis not present

## 2023-03-19 DIAGNOSIS — J9601 Acute respiratory failure with hypoxia: Secondary | ICD-10-CM | POA: Diagnosis not present

## 2023-03-19 LAB — BRAIN NATRIURETIC PEPTIDE: B Natriuretic Peptide: 578.5 pg/mL — ABNORMAL HIGH (ref 0.0–100.0)

## 2023-03-19 MED ORDER — FUROSEMIDE 20 MG PO TABS
10.0000 mg | ORAL_TABLET | Freq: Every day | ORAL | 0 refills | Status: DC
  Filled 2023-03-19: qty 10, 20d supply, fill #0

## 2023-03-19 NOTE — Plan of Care (Signed)

## 2023-03-19 NOTE — Progress Notes (Signed)
Discharge instructions reviewed with pt. Copy of instructions given to pt. [facility] has filled one script for pt and will be picked up on the way out for discharge. Pt getting dressed, she is calling her daughter which is only 5 minutes away she states and will be at the main entrance to pick up pt.   Pt will be d/c'd via wheelchair with belongings, and will be escorted by staff.    Kaylenn Civil,RN SWOT

## 2023-03-19 NOTE — Discharge Summary (Signed)
Physician Discharge Summary  Kimberly Villegas ZOX:[id number] DOB: 10-13-1934 DOA: 03/17/2023  PCP: Chilton Greathouse, MD  Admit date: 03/17/2023 Discharge date: 03/19/2023  Admitted From: (Home) Disposition:  (Home)  Recommendations for Outpatient Follow-up:  Follow up with PCP in 1-2 weeks Please obtain BMP/CBC in one week Repeat chest x-ray in 3 to 4 weeks as an outpatient to ensure resolution of pneumonia and pleural effusion Please check echo as an outpatient if patient remains with pleural effusion  Diet recommendation: Heart Healthy  Brief/Interim Summary:  88 y.o. female with medical history significant of hypertension, hyperlipidemia, GERD, CKD 3A, anemia, atrial fibrillation, CAD, macular degeneration, hearing loss, asthma, gout, insomnia presenting with ongoing sore throat, shortness of breath, chest heaviness.  Was diagnosed with rhinovirus pneumonia and admitted.    Acute respiratory failure with hypoxia due to rhinovirus pneumonia. > Presenting with sore throat, shortness of breath, dry cough and some atypical chest pain with cough, cultures positive for rhinovirus pneumonia, continue supportive care, encouraged to sit in chair use I-S and flutter valve, advance activity titrate on oxygen.  Already feeling much better, she is persistently on room air today with no oxygen requirement, she will be discharged home with recommendation to continue with her incentive spirometry, flutter valve and to repeat chest x-ray in 3 to 4 weeks to ensure resolution of left lower lobe pneumonia   Atrial fibrillation/flutter > Has follow-up with cardiology. - Continue home dronedarone, metoprolol, and Eliquis   QT prolongation > QTc noted to be 500 in the ED. - On telemetry - Avoid additional QTc prolonging medications -Repeat EKG on 03/18/2023 shows QTc improved to 480 ms, continue beta-blocker, likely mild prolongation due to her being on dronedarone.   Hypertension - Continue home  metoprolol  Bilateral pleural -Chest x-ray significant for mild bilateral pleural effusion, her BNP is elevated at 78, but she has no clinical signs of CHF, no edema, no JVD, she will be discharged on low-dose Lasix 10 mg over next 10 days and to repeat chest x-ray in 3 to 4 weeks and if pleural effusion remains there then we will need to check echo   Hyperlipidemia - Continue home atorvastatin   CKD 3A > Creatinine stable in the ED 1.32. - Trend BMP   Anemia > Hemoglobin stable at 10.4 in ED.  Macrocytic. - Trend CBC   Insomnia - Replace home Sonata with formulary Ambien, low-dose      Discharge Diagnoses:  Principal Problem:   Acute respiratory failure with hypoxia (HCC) Active Problems:   Atrial fibrillation (HCC)   Asthma without status asthmaticus   Atherosclerotic heart disease of native coronary artery without angina pectoris   Chronic anemia   Chronic kidney disease, stage 3a (HCC)   Gastro-esophageal reflux disease without esophagitis   Hyperlipidemia   HTN (hypertension)   Macular degeneration    Discharge Instructions  Discharge Instructions     Diet - low sodium heart healthy   Complete by: As directed    Discharge instructions   Complete by: As directed    Follow with Primary MD Avva, Ravisankar, MD in 7 days   Get CBC, CMP, 2 view Chest X ray checked  by Primary MD next visit.    Activity: As tolerated with Full fall precautions use walker/cane & assistance as needed   Disposition Home    Diet: Heart Healthy   On your next visit with your primary care physician please Get Medicines reviewed and adjusted.   Please request your Prim.MD to  go over all Hospital Tests and Procedure/Radiological results at the follow up, please get all Hospital records sent to your Prim MD by signing hospital release before you go home.   If you experience worsening of your admission symptoms, develop shortness of breath, life threatening emergency, suicidal or  homicidal thoughts you must seek medical attention immediately by calling 911 or calling your MD immediately  if symptoms less severe.  You Must read complete instructions/literature along with all the possible adverse reactions/side effects for all the Medicines you take and that have been prescribed to you. Take any new Medicines after you have completely understood and accpet all the possible adverse reactions/side effects.   Do not drive, operating heavy machinery, perform activities at heights, swimming or participation in water activities or provide baby sitting services if your were admitted for syncope or siezures until you have seen by Primary MD or a Neurologist and advised to do so again.  Do not drive when taking Pain medications.    Do not take more than prescribed Pain, Sleep and Anxiety Medications  Special Instructions: If you have smoked or chewed Tobacco  in the last 2 yrs please stop smoking, stop any regular Alcohol  and or any Recreational drug use.  Wear Seat belts while driving.   Please note  You were cared for by a hospitalist during your hospital stay. If you have any questions about your discharge medications or the care you received while you were in the hospital after you are discharged, you can call the unit and asked to speak with the hospitalist on call if the hospitalist that took care of you is not available. Once you are discharged, your primary care physician will handle any further medical issues. Please note that NO REFILLS for any discharge medications will be authorized once you are discharged, as it is imperative that you return to your primary care physician (or establish a relationship with a primary care physician if you do not have one) for your aftercare needs so that they can reassess your need for medications and monitor your lab values.   Increase activity slowly   Complete by: As directed       Allergies as of 03/19/2023       Reactions    Vibramycin [doxycycline] Other (See Comments)   Photosensitivity         Medication List     TAKE these medications    acetaminophen 325 MG tablet Commonly known as: TYLENOL Take 650 mg by mouth every 6 (six) hours as needed for moderate pain (pain score 4-6), fever or headache.   Align 4 MG Caps Take 4 mg by mouth daily as needed (bloating).   apixaban 5 MG Tabs tablet Commonly known as: ELIQUIS Take 5 mg by mouth 2 (two) times daily after a meal.   atorvastatin 40 MG tablet Commonly known as: LIPITOR TAKE 1 TABLET DAILY What changed: when to take this   BENEFIBER PO Take 1 Scoop by mouth daily.   furosemide 20 MG tablet Commonly known as: Lasix Take 0.5 tablets (10 mg total) by mouth daily.   ipratropium 0.03 % nasal spray Commonly known as: ATROVENT Place 2 sprays into both nostrils daily.   metoprolol succinate 25 MG 24 hr tablet Commonly known as: TOPROL-XL Take 2 tablets (50 mg total) by mouth daily. What changed:  how much to take when to take this   mirabegron ER 50 MG Tb24 tablet Commonly known as: MYRBETRIQ Take 50 mg  by mouth at bedtime.   Multaq 400 MG tablet Generic drug: dronedarone TAKE 1 TABLET TWICE A DAY AFTER MEALS   NON FORMULARY Take 1-2 tablets by mouth See admin instructions. Bone strength supplement. Take 1 tablet in the morning and 2 tablets in the evening   OVER THE COUNTER MEDICATION Take 1-2 tablets by mouth See admin instructions. OTC Bone Strength Supplement. Take 1 tablet in the morning and 2 tablets after supper.   PreserVision AREDS 2 Caps Take 1 capsule by mouth 2 (two) times daily.   zaleplon 5 MG capsule Commonly known as: SONATA Take 5 mg by mouth at bedtime as needed for sleep.        Allergies  Allergen Reactions   Vibramycin [Doxycycline] Other (See Comments)    Photosensitivity     Consultations: none   Procedures/Studies: DG Chest 2 View  Result Date: 03/17/2023 CLINICAL DATA:  Cough and  chest pain. EXAM: CHEST - 2 VIEW COMPARISON:  08/26/2022. FINDINGS: Mild pulmonary vascular congestion. There is new left retrocardiac opacities, which may represent combination of left lung lower lobe atelectasis and/or consolidation with small left pleural effusion. There is also small right pleural effusion. No pneumothorax. Stable cardio-mediastinal silhouette. No acute osseous abnormalities. The soft tissues are within normal limits. IMPRESSION: *Mild pulmonary edema. *Bilateral small pleural effusions. There is also left lung lower lobe atelectasis and/or consolidation. Electronically Signed   By: Jules Schick M.D.   On: 03/17/2023 15:51      Subjective: Denies any complaints today, eager to go home, no dyspnea, no shortness of breath, no leg edema  Discharge Exam: Vitals:   03/19/23 0741 03/19/23 0800  BP:  (!) 134/57  Pulse:  98  Resp: 20 20  Temp:    SpO2:  93%   Vitals:   03/18/23 2000 03/18/23 2349 03/19/23 0741 03/19/23 0800  BP: (!) 111/45 134/65  (!) 134/57  Pulse: 73 86  98  Resp: 20 19 20 20   Temp: 98.2 F (36.8 C) 98.1 F (36.7 C)    TempSrc: Oral Axillary Oral   SpO2: 93% 92%  93%  Weight:      Height:        General: Pt is alert, awake, not in acute distress Cardiovascular: RRR, S1/S2 +, no rubs, no gallops Respiratory: CTA bilaterally, no wheezing, no rhonchi Abdominal: Soft, NT, ND, bowel sounds + Extremities: no edema, no cyanosis    The results of significant diagnostics from this hospitalization (including imaging, microbiology, ancillary and laboratory) are listed below for reference.     Microbiology: Recent Results (from the past 240 hour(s))  Resp panel by RT-PCR (RSV, Flu A&B, Covid) Anterior Nasal Swab     Status: None   Collection Time: 03/17/23  9:09 AM   Specimen: Anterior Nasal Swab  Result Value Ref Range Status   SARS Coronavirus 2 by RT PCR NEGATIVE NEGATIVE Final   Influenza A by PCR NEGATIVE NEGATIVE Final   Influenza B by PCR  NEGATIVE NEGATIVE Final    Comment: (NOTE) The Xpert Xpress SARS-CoV-2/FLU/RSV plus assay is intended as an aid in the diagnosis of influenza from Nasopharyngeal swab specimens and should not be used as a sole basis for treatment. Nasal washings and aspirates are unacceptable for Xpert Xpress SARS-CoV-2/FLU/RSV testing.  Fact Sheet for Patients: BloggerCourse.com  Fact Sheet for Healthcare Providers: SeriousBroker.it  This test is not yet approved or cleared by the [place] FDA and has been authorized for detection and/or diagnosis of SARS-CoV-2 by  FDA under an Emergency Use Authorization (EUA). This EUA will remain in effect (meaning this test can be used) for the duration of the COVID-19 declaration under Section 564(b)(1) of the Act, 21 U.S.C. section 360bbb-3(b)(1), unless the authorization is terminated or revoked.     Resp Syncytial Virus by PCR NEGATIVE NEGATIVE Final    Comment: (NOTE) Fact Sheet for Patients: BloggerCourse.com  Fact Sheet for Healthcare Providers: SeriousBroker.it  This test is not yet approved or cleared by the [place] FDA and has been authorized for detection and/or diagnosis of SARS-CoV-2 by FDA under an Emergency Use Authorization (EUA). This EUA will remain in effect (meaning this test can be used) for the duration of the COVID-19 declaration under Section 564(b)(1) of the Act, 21 U.S.C. section 360bbb-3(b)(1), unless the authorization is terminated or revoked.  Performed at [facility], [address], [place], [place] [address]   Group A Strep by PCR     Status: None   Collection Time: 03/17/23  9:09 AM   Specimen: Anterior Nasal Swab; Sterile Swab  Result Value Ref Range Status   Group A Strep by PCR NOT DETECTED NOT DETECTED Final    Comment: Performed at [facility], [address] [place], [place] [address]   Respiratory (~20 pathogens) panel by PCR     Status: Abnormal   Collection Time: 03/17/23  5:37 PM   Specimen: Nasopharyngeal Swab; Respiratory  Result Value Ref Range Status   Adenovirus NOT DETECTED NOT DETECTED Final   Coronavirus 229E NOT DETECTED NOT DETECTED Final    Comment: (NOTE) The Coronavirus on the Respiratory Panel, DOES NOT test for the novel  Coronavirus (2019 nCoV)    Coronavirus HKU1 NOT DETECTED NOT DETECTED Final   Coronavirus NL63 NOT DETECTED NOT DETECTED Final   Coronavirus OC43 NOT DETECTED NOT DETECTED Final   Metapneumovirus NOT DETECTED NOT DETECTED Final   Rhinovirus / Enterovirus DETECTED (A) NOT DETECTED Final   Influenza A NOT DETECTED NOT DETECTED Final   Influenza B NOT DETECTED NOT DETECTED Final   Parainfluenza Virus 1 NOT DETECTED NOT DETECTED Final   Parainfluenza Virus 2 NOT DETECTED NOT DETECTED Final   Parainfluenza Virus 3 NOT DETECTED NOT DETECTED Final   Parainfluenza Virus 4 NOT DETECTED NOT DETECTED Final   Respiratory Syncytial Virus NOT DETECTED NOT DETECTED Final   Bordetella pertussis NOT DETECTED NOT DETECTED Final   Bordetella Parapertussis NOT DETECTED NOT DETECTED Final   Chlamydophila pneumoniae NOT DETECTED NOT DETECTED Final   Mycoplasma pneumoniae NOT DETECTED NOT DETECTED Final    Comment: Performed at [facility], [address], [place], [place] [address]     Labs: BNP (last 3 results) Recent Labs    03/18/23 0345  BNP 578.5*   Basic Metabolic Panel: Recent Labs  Lab 03/17/23 0909 03/18/23 0349  NA 140 135  K 3.9 3.8  CL 109 105  CO2 26 23  GLUCOSE 101* 90  BUN 19 14  CREATININE 1.32* 1.03*  CALCIUM 8.9 8.5*   Liver Function Tests: Recent Labs  Lab 03/18/23 0349  AST 19  ALT 15  ALKPHOS 54  BILITOT 1.4*  PROT 5.8*  ALBUMIN 2.7*   No results for input(s): "LIPASE", "AMYLASE" in the last 168 hours. No results for input(s): "AMMONIA" in the last 168 hours. CBC: Recent Labs  Lab  03/17/23 0909 03/18/23 0349  WBC 9.1 7.8  NEUTROABS 7.4  --   HGB 10.4* 9.3*  HCT 31.8*  28.3*  MCV 101.6* 99.3  PLT 161 151   Cardiac Enzymes: No results for input(s): "CKTOTAL", "CKMB", "CKMBINDEX", "TROPONINI" in the last 168 hours. BNP: Invalid input(s): "POCBNP" CBG: No results for input(s): "GLUCAP" in the last 168 hours. D-Dimer No results for input(s): "DDIMER" in the last 72 hours. Hgb A1c No results for input(s): "HGBA1C" in the last 72 hours. Lipid Profile No results for input(s): "CHOL", "HDL", "LDLCALC", "TRIG", "CHOLHDL", "LDLDIRECT" in the last 72 hours. Thyroid function studies No results for input(s): "TSH", "T4TOTAL", "T3FREE", "THYROIDAB" in the last 72 hours.  Invalid input(s): "FREET3" Anemia work up No results for input(s): "VITAMINB12", "FOLATE", "FERRITIN", "TIBC", "IRON", "RETICCTPCT" in the last 72 hours. Urinalysis No results found for: "COLORURINE", "APPEARANCEUR", "LABSPEC", "PHURINE", "GLUCOSEU", "HGBUR", "BILIRUBINUR", "KETONESUR", "PROTEINUR", "UROBILINOGEN", "NITRITE", "LEUKOCYTESUR" Sepsis Labs Recent Labs  Lab 03/17/23 0909 03/18/23 0349  WBC 9.1 7.8   Microbiology Recent Results (from the past 240 hour(s))  Resp panel by RT-PCR (RSV, Flu A&B, Covid) Anterior Nasal Swab     Status: None   Collection Time: 03/17/23  9:09 AM   Specimen: Anterior Nasal Swab  Result Value Ref Range Status   SARS Coronavirus 2 by RT PCR NEGATIVE NEGATIVE Final   Influenza A by PCR NEGATIVE NEGATIVE Final   Influenza B by PCR NEGATIVE NEGATIVE Final    Comment: (NOTE) The Xpert Xpress SARS-CoV-2/FLU/RSV plus assay is intended as an aid in the diagnosis of influenza from Nasopharyngeal swab specimens and should not be used as a sole basis for treatment. Nasal washings and aspirates are unacceptable for Xpert Xpress SARS-CoV-2/FLU/RSV testing.  Fact Sheet for Patients: BloggerCourse.com  Fact Sheet for Healthcare  Providers: SeriousBroker.it  This test is not yet approved or cleared by the [place] FDA and has been authorized for detection and/or diagnosis of SARS-CoV-2 by FDA under an Emergency Use Authorization (EUA). This EUA will remain in effect (meaning this test can be used) for the duration of the COVID-19 declaration under Section 564(b)(1) of the Act, 21 U.S.C. section 360bbb-3(b)(1), unless the authorization is terminated or revoked.     Resp Syncytial Virus by PCR NEGATIVE NEGATIVE Final    Comment: (NOTE) Fact Sheet for Patients: BloggerCourse.com  Fact Sheet for Healthcare Providers: SeriousBroker.it  This test is not yet approved or cleared by the [place] FDA and has been authorized for detection and/or diagnosis of SARS-CoV-2 by FDA under an Emergency Use Authorization (EUA). This EUA will remain in effect (meaning this test can be used) for the duration of the COVID-19 declaration under Section 564(b)(1) of the Act, 21 U.S.C. section 360bbb-3(b)(1), unless the authorization is terminated or revoked.  Performed at [facility], [address], [place], [place] [address]   Group A Strep by PCR     Status: None   Collection Time: 03/17/23  9:09 AM   Specimen: Anterior Nasal Swab; Sterile Swab  Result Value Ref Range Status   Group A Strep by PCR NOT DETECTED NOT DETECTED Final    Comment: Performed at [facility], [address], [place], [place] [address]  Respiratory (~20 pathogens) panel by PCR     Status: Abnormal   Collection Time: 03/17/23  5:37 PM   Specimen: Nasopharyngeal Swab; Respiratory  Result Value Ref Range Status   Adenovirus NOT DETECTED NOT DETECTED Final   Coronavirus 229E NOT DETECTED NOT DETECTED Final    Comment: (NOTE) The Coronavirus on the Respiratory Panel, DOES NOT test for the novel  Coronavirus (  2019 nCoV)    Coronavirus HKU1 NOT DETECTED  NOT DETECTED Final   Coronavirus NL63 NOT DETECTED NOT DETECTED Final   Coronavirus OC43 NOT DETECTED NOT DETECTED Final   Metapneumovirus NOT DETECTED NOT DETECTED Final   Rhinovirus / Enterovirus DETECTED (A) NOT DETECTED Final   Influenza A NOT DETECTED NOT DETECTED Final   Influenza B NOT DETECTED NOT DETECTED Final   Parainfluenza Virus 1 NOT DETECTED NOT DETECTED Final   Parainfluenza Virus 2 NOT DETECTED NOT DETECTED Final   Parainfluenza Virus 3 NOT DETECTED NOT DETECTED Final   Parainfluenza Virus 4 NOT DETECTED NOT DETECTED Final   Respiratory Syncytial Virus NOT DETECTED NOT DETECTED Final   Bordetella pertussis NOT DETECTED NOT DETECTED Final   Bordetella Parapertussis NOT DETECTED NOT DETECTED Final   Chlamydophila pneumoniae NOT DETECTED NOT DETECTED Final   Mycoplasma pneumoniae NOT DETECTED NOT DETECTED Final    Comment: Performed at [facility], [address], [place], [place] [address]     Time coordinating discharge: Over 30 minutes  SIGNED:   Huey Bienenstock, MD  [facility] 03/19/2023, 11:42 AM Pager   If 7PM-7AM, please contact night-coverage [email or web address]

## 2023-03-19 NOTE — Discharge Instructions (Signed)
Follow with Primary MD Avva, Ravisankar, MD in 7 days   Get CBC, CMP, 2 view Chest X ray checked  by Primary MD next visit.    Activity: As tolerated with Full fall precautions use walker/cane & assistance as needed   Disposition Home    Diet: Heart Healthy   On your next visit with your primary care physician please Get Medicines reviewed and adjusted.   Please request your Prim.MD to go over all Hospital Tests and Procedure/Radiological results at the follow up, please get all Hospital records sent to your Prim MD by signing hospital release before you go home.   If you experience worsening of your admission symptoms, develop shortness of breath, life threatening emergency, suicidal or homicidal thoughts you must seek medical attention immediately by calling 911 or calling your MD immediately  if symptoms less severe.  You Must read complete instructions/literature along with all the possible adverse reactions/side effects for all the Medicines you take and that have been prescribed to you. Take any new Medicines after you have completely understood and accpet all the possible adverse reactions/side effects.   Do not drive, operating heavy machinery, perform activities at heights, swimming or participation in water activities or provide baby sitting services if your were admitted for syncope or siezures until you have seen by Primary MD or a Neurologist and advised to do so again.  Do not drive when taking Pain medications.    Do not take more than prescribed Pain, Sleep and Anxiety Medications  Special Instructions: If you have smoked or chewed Tobacco  in the last 2 yrs please stop smoking, stop any regular Alcohol  and or any Recreational drug use.  Wear Seat belts while driving.   Please note  You were cared for by a hospitalist during your hospital stay. If you have any questions about your discharge medications or the care you received while you were in the hospital after  you are discharged, you can call the unit and asked to speak with the hospitalist on call if the hospitalist that took care of you is not available. Once you are discharged, your primary care physician will handle any further medical issues. Please note that NO REFILLS for any discharge medications will be authorized once you are discharged, as it is imperative that you return to your primary care physician (or establish a relationship with a primary care physician if you do not have one) for your aftercare needs so that they can reassess your need for medications and monitor your lab values.

## 2023-03-19 NOTE — Plan of Care (Signed)
  Problem: Education: Goal: Knowledge of General Education information will improve Description: Including pain rating scale, medication(s)/side effects and non-pharmacologic comfort measures 03/19/2023 1317 by Rosalio Macadamia, RN Outcome: Adequate for Discharge 03/19/2023 1104 by Rosalio Macadamia, RN Outcome: Progressing   Problem: Health Behavior/Discharge Planning: Goal: Ability to manage health-related needs will improve 03/19/2023 1317 by Rosalio Macadamia, RN Outcome: Adequate for Discharge 03/19/2023 1104 by Rosalio Macadamia, RN Outcome: Progressing   Problem: Clinical Measurements: Goal: Ability to maintain clinical measurements within normal limits will improve 03/19/2023 1317 by Rosalio Macadamia, RN Outcome: Adequate for Discharge 03/19/2023 1104 by Rosalio Macadamia, RN Outcome: Progressing Goal: Will remain free from infection 03/19/2023 1317 by Rosalio Macadamia, RN Outcome: Adequate for Discharge 03/19/2023 1104 by Rosalio Macadamia, RN Outcome: Progressing Goal: Diagnostic test results will improve 03/19/2023 1317 by Rosalio Macadamia, RN Outcome: Adequate for Discharge 03/19/2023 1104 by Rosalio Macadamia, RN Outcome: Progressing Goal: Respiratory complications will improve 03/19/2023 1317 by Rosalio Macadamia, RN Outcome: Adequate for Discharge 03/19/2023 1104 by Rosalio Macadamia, RN Outcome: Progressing Goal: Cardiovascular complication will be avoided 03/19/2023 1317 by Rosalio Macadamia, RN Outcome: Adequate for Discharge 03/19/2023 1104 by Rosalio Macadamia, RN Outcome: Progressing   Problem: Activity: Goal: Risk for activity intolerance will decrease 03/19/2023 1317 by Rosalio Macadamia, RN Outcome: Adequate for Discharge 03/19/2023 1104 by Rosalio Macadamia, RN Outcome: Progressing   Problem: Nutrition: Goal: Adequate nutrition will be maintained 03/19/2023 1317 by Rosalio Macadamia, RN Outcome: Adequate for Discharge 03/19/2023 1104 by Rosalio Macadamia, RN Outcome:  Progressing   Problem: Coping: Goal: Level of anxiety will decrease 03/19/2023 1317 by Rosalio Macadamia, RN Outcome: Adequate for Discharge 03/19/2023 1104 by Rosalio Macadamia, RN Outcome: Progressing   Problem: Elimination: Goal: Will not experience complications related to bowel motility 03/19/2023 1317 by Rosalio Macadamia, RN Outcome: Adequate for Discharge 03/19/2023 1104 by Rosalio Macadamia, RN Outcome: Progressing Goal: Will not experience complications related to urinary retention 03/19/2023 1317 by Rosalio Macadamia, RN Outcome: Adequate for Discharge 03/19/2023 1104 by Rosalio Macadamia, RN Outcome: Progressing   Problem: Pain Management: Goal: General experience of comfort will improve 03/19/2023 1317 by Rosalio Macadamia, RN Outcome: Adequate for Discharge 03/19/2023 1104 by Rosalio Macadamia, RN Outcome: Progressing   Problem: Safety: Goal: Ability to remain free from injury will improve 03/19/2023 1317 by Rosalio Macadamia, RN Outcome: Adequate for Discharge 03/19/2023 1104 by Rosalio Macadamia, RN Outcome: Progressing   Problem: Skin Integrity: Goal: Risk for impaired skin integrity will decrease 03/19/2023 1317 by Rosalio Macadamia, RN Outcome: Adequate for Discharge 03/19/2023 1104 by Rosalio Macadamia, RN Outcome: Progressing

## 2023-03-21 ENCOUNTER — Telehealth: Payer: Self-pay | Admitting: Internal Medicine

## 2023-03-21 DIAGNOSIS — I351 Nonrheumatic aortic (valve) insufficiency: Secondary | ICD-10-CM

## 2023-03-21 DIAGNOSIS — I480 Paroxysmal atrial fibrillation: Secondary | ICD-10-CM

## 2023-03-21 NOTE — Telephone Encounter (Signed)
Pt states she was in hospital (11/11) and upon discharge they said they would like her to get a Echo. Did not see this noted.  Pt has follow up appt with Dr Jacinto Halim on Mar 26, 2023. Will forward to Dr Ladona Ridgel and Dr Jacinto Halim for review.

## 2023-03-21 NOTE — Telephone Encounter (Signed)
Pt was in the hospital and they wan her to have an Echo. Pt wants to talk to nurse about it

## 2023-03-24 NOTE — Telephone Encounter (Signed)
Echo ordered per Dr. Jacinto Halim: Please order Echo. Indication PAF, moderate aortic regurgitation

## 2023-03-24 NOTE — Addendum Note (Signed)
Addended by: Franchot Gallo on: 03/24/2023 08:42 AM   Modules accepted: Orders

## 2023-03-26 ENCOUNTER — Encounter: Payer: Self-pay | Admitting: Cardiology

## 2023-03-26 ENCOUNTER — Ambulatory Visit: Payer: Medicare HMO | Attending: Cardiology

## 2023-03-26 ENCOUNTER — Ambulatory Visit: Payer: Medicare HMO | Attending: Cardiology | Admitting: Cardiology

## 2023-03-26 VITALS — BP 112/68 | HR 85 | Resp 16 | Ht 65.0 in | Wt 140.8 lb

## 2023-03-26 DIAGNOSIS — R55 Syncope and collapse: Secondary | ICD-10-CM

## 2023-03-26 DIAGNOSIS — I351 Nonrheumatic aortic (valve) insufficiency: Secondary | ICD-10-CM | POA: Diagnosis not present

## 2023-03-26 DIAGNOSIS — I951 Orthostatic hypotension: Secondary | ICD-10-CM

## 2023-03-26 DIAGNOSIS — I480 Paroxysmal atrial fibrillation: Secondary | ICD-10-CM

## 2023-03-26 DIAGNOSIS — I340 Nonrheumatic mitral (valve) insufficiency: Secondary | ICD-10-CM | POA: Diagnosis not present

## 2023-03-26 NOTE — Progress Notes (Signed)
Cardiology Office Note:  .   Date:  03/26/2023  ID:  Kimberly Villegas, DOB July 08, 1934, MRN [id number] PCP: Chilton Greathouse, MD  [facility] Providers Cardiologist:  Yates Decamp, MD Electrophysiologist:  Lanier Prude, MD   History of Present Illness: .   Kimberly Villegas is a 88 y.o. female with paroxysmal atrial fibrillation, s/p cardioversion on 09/2016, 07/29/17, 07/18/2020, and 09/04/2021 with return to sinus rhythm, mild to moderate aortic and mitral regurgitation and very mild asymptomatic left subclavian artery stenosis, carotid atherosclerosis, previously hypertensive but since summer 2024, blood pressure has been soft and hyperlipidemia. She also had nonischemic cardiomyopathy with EF 40% when she was in atrial fibrillation which improved back to normal after she maintained sinus rhythm in 2018.   Patient admitted on 03/17/2023 and discharged 2 days later with rhinovirus pneumonia and was recommended to follow-up with cardiology for follow-up of aortic regurgitation and atrial fibrillation.  However her main complaint is recurrent syncope and collapse.  Discussed the use of AI scribe software for clinical note transcription with the patient, who gave verbal consent to proceed.  History of Present Illness   The patient, with a history of atrial fibrillation, presents with recurrent syncope over the past six months. The first episode resulted in no injury, but the second caused significant dental damage. The most recent episode occurred in an elevator, where she collapsed but did not injure herself due to being stationary. She regained consciousness quickly after each episode, asking, "where am I? What happened?" She denies any prodromal symptoms. She wonders if a glass of wine could have contributed to the most recent episode.  In addition to the syncope, she experienced two episodes of dyspnea on exertion when going to the restroom, which prompted her to call an ambulance. At  the hospital, she was found to be in the 80s on room air and was diagnosed with pneumonia. She tested negative for COVID-19 twice.  She has been under significant stress and has had difficulty contacting the office due to transitions. She saw another doctor, who recommended increasing dietary salt intake to possibly help with the syncope. She denies any other new symptoms or changes in health.       Review of Systems  Cardiovascular:  Positive for syncope. Negative for chest pain, dyspnea on exertion and leg swelling.    Labs    Lab Results  Component Value Date   NA 135 03/18/2023   K 3.8 03/18/2023   CO2 23 03/18/2023   GLUCOSE 90 03/18/2023   BUN 14 03/18/2023   CREATININE 1.03 (H) 03/18/2023   CALCIUM 8.5 (L) 03/18/2023   EGFR 32 (L) 08/29/2021   GFRNONAA 53 (L) 03/18/2023      Latest Ref Rng & Units 03/18/2023    3:49 AM 03/17/2023    9:09 AM 11/08/2022    8:24 PM  BMP  Glucose 70 - 99 mg/dL 90  322  94   BUN 8 - 23 mg/dL 14  19  23    Creatinine 0.44 - 1.00 mg/dL 0.25  4.27  0.62   Sodium 135 - 145 mmol/L 135  140  138   Potassium 3.5 - 5.1 mmol/L 3.8  3.9  3.9   Chloride 98 - 111 mmol/L 105  109  105   CO2 22 - 32 mmol/L 23  26  24    Calcium 8.9 - 10.3 mg/dL 8.5  8.9  9.1        Latest Ref Rng & Units  03/18/2023    3:49 AM 03/17/2023    9:09 AM 11/08/2022    8:24 PM  CBC  WBC 4.0 - 10.5 K/uL 7.8  9.1  7.4   Hemoglobin 12.0 - 15.0 g/dL 9.3  16.1  09.6   Hematocrit 36.0 - 46.0 % 28.3  31.8  30.5   Platelets 150 - 400 K/uL 151  161  205    Physical Exam:   VS:  BP 112/68 (BP Location: Left Arm, Patient Position: Sitting, Cuff Size: Normal)   Pulse 85   Resp 16   Ht 5\' 5"  (1.651 m)   Wt 140 lb 12.8 oz (63.9 kg)   SpO2 96%   BMI 23.43 kg/m    Wt Readings from Last 3 Encounters:  03/26/23 140 lb 12.8 oz (63.9 kg)  03/17/23 141 lb 1.5 oz (64 kg)  03/10/23 140 lb 12.8 oz (63.9 kg)    Orthostatic VS for the past 72 hrs (Last 3 readings):  Orthostatic BP  Patient Position BP Location Cuff Size Orthostatic Pulse  03/26/23 1349 100/66 Standing Left Arm Normal 84  03/26/23 1348 125/73 Sitting Left Arm Normal 82  03/26/23 1347 134/67 Supine Left Arm Normal 87    Physical Exam Neck:     Vascular: No JVD.  Cardiovascular:     Rate and Rhythm: Normal rate and regular rhythm.     Pulses: Intact distal pulses.     Heart sounds: S1 normal and S2 normal. Murmur heard.     Holosystolic murmur is present with a grade of 2/6 at the lower left sternal border.     No gallop.  Pulmonary:     Effort: Pulmonary effort is normal.     Breath sounds: Normal breath sounds.  Abdominal:     General: Bowel sounds are normal.     Palpations: Abdomen is soft.  Musculoskeletal:     Right lower leg: No edema.     Left lower leg: No edema.     Studies Reviewed: .    Echocardiogram 02/01/2021:  Echocardiogram 02/01/2021: Left ventricle cavity is normal in size and wall thickness. Normal global wall motion. Normal LV systolic function with EF 65%. Diastolic function not assessed due to severity of mitral regurgitation. Left atrial cavity is moderately dilated. Right atrial cavity is moderately dilated. Trileaflet aortic valve.  Moderate (Grade III) aortic regurgitation. Moderate (Grade III) mitral regurgitation. Mild tricuspid regurgitation. Estimated pulmonary artery systolic pressure 37 mmHg. Personally compared with previous study on 06/17/2019. No significant change noted.  EKG:    EKG 03/18/2023: Normal sinus rhythm with rate of 83 bpm, normal axis.  No evidence of ischemia, normal EKG.  Compared to 03/17/2023, no significant change.  Medications and allergies    Allergies  Allergen Reactions   Vibramycin [Doxycycline] Other (See Comments)    Photosensitivity     Current Meds  Medication Sig   acetaminophen (TYLENOL) 325 MG tablet Take 650 mg by mouth every 6 (six) hours as needed for moderate pain (pain score 4-6), fever or headache.    apixaban (ELIQUIS) 5 MG TABS tablet Take 5 mg by mouth 2 (two) times daily after a meal.   atorvastatin (LIPITOR) 40 MG tablet TAKE 1 TABLET DAILY (Patient taking differently: Take 40 mg by mouth at bedtime.)   furosemide (LASIX) 20 MG tablet Take 0.5 tablets (10 mg total) by mouth daily.   ipratropium (ATROVENT) 0.03 % nasal spray Place 2 sprays into both nostrils daily.   metoprolol succinate (TOPROL-XL) 25 MG  24 hr tablet Take 2 tablets (50 mg total) by mouth daily. (Patient taking differently: Take 25 mg by mouth 2 (two) times daily.)   mirabegron ER (MYRBETRIQ) 50 MG TB24 tablet Take 50 mg by mouth at bedtime.   MULTAQ 400 MG tablet TAKE 1 TABLET TWICE A DAY AFTER MEALS   Multiple Vitamins-Minerals (PRESERVISION AREDS 2) CAPS Take 1 capsule by mouth 2 (two) times daily.   NON FORMULARY Take 1-2 tablets by mouth See admin instructions. Bone strength supplement. Take 1 tablet in the morning and 2 tablets in the evening   OVER THE COUNTER MEDICATION Take 1-2 tablets by mouth See admin instructions. OTC Bone Strength Supplement. Take 1 tablet in the morning and 2 tablets after supper.   Probiotic Product (ALIGN) 4 MG CAPS Take 4 mg by mouth daily as needed (bloating).   Wheat Dextrin (BENEFIBER PO) Take 1 Scoop by mouth daily.   zaleplon (SONATA) 5 MG capsule Take 5 mg by mouth at bedtime as needed for sleep.     ASSESSMENT AND PLAN: .      ICD-10-CM   1. Syncope and collapse  R55 CARDIAC EVENT MONITOR    2. Orthostatic hypotension  I95.1     3. Paroxysmal atrial fibrillation (HCC)  I48.0     4. Moderate aortic regurgitation  I35.1     5. Moderate mitral regurgitation  I34.0       There are no diagnoses linked to this encounter.  Assessment and Plan  1. Syncope and collapse Patient with the past 3 months has been having unexplained frequent episodes of syncope.  It could be related to low blood pressure however on her last office visit 6 months ago at discontinued her ACE  inhibitor and she is only on a low-dose of beta-blocker therapy.  Although she was orthostatic today previously when I had seen her 6 months ago she was not orthostatic.  She has increased salt intake, I will continue low-dose beta-blocker therapy for now, would like to evaluate syncope from EP standpoint with her age being 70, sinus node dysfunction and AV nodal block is also a possibility.  I will schedule her for a live event monitor for 30 days.  She is already been scheduled for echocardiogram to evaluate moderate aortic regurgitation and moderate mitral regurgitation that was noted in 2022, no change in physical exam.  No clinical evidence of heart failure.  - CARDIAC EVENT MONITOR; Future  2. Orthostatic hypotension She is orthostatic today but previously although blood pressure was soft she was not orthostatic.  She will continue being liberal with her salt intake, will advise her to use support stockings as well.  Will follow-up after event monitor.  3. Paroxysmal atrial fibrillation (HCC) She is maintaining sinus rhythm on Multaq, continue same.  4. Moderate aortic regurgitation As dictated above I will follow-up on valvular heart disease.  5. Moderate mitral regurgitation See above.   Signed,  Yates Decamp, MD, [facility] 03/26/2023, 2:03 PM [facility] [address] [place], [place] [address] Phone: [phone]. Fax:  [phone]

## 2023-03-26 NOTE — Progress Notes (Unsigned)
[employer] event monitor serial # [id number] from office inventory applied to patient.

## 2023-03-26 NOTE — Patient Instructions (Addendum)
Medication Instructions:  Your physician recommends that you continue on your current medications as directed. Please refer to the Current Medication list given to you today.  *If you need a refill on your cardiac medications before your next appointment, please call your pharmacy*   Lab Work: none If you have labs (blood work) drawn today and your tests are completely normal, you will receive your results only by: MyChart Message (if you have MyChart) OR A paper copy in the mail If you have any lab test that is abnormal or we need to change your treatment, we will call you to review the results.   Testing/Procedures: Your physician has recommended that you wear an event monitor. Event monitors are medical devices that record the heart's electrical activity. Doctors most often [place] these monitors to diagnose arrhythmias. Arrhythmias are problems with the speed or rhythm of the heartbeat. The monitor is a small, portable device. You can wear one while you do your normal daily activities. This is usually used to diagnose what is causing palpitations/syncope (passing out).    Follow-Up: At [facility], you and your health needs are our priority.  As part of our continuing mission to provide you with exceptional heart care, we have created designated Provider Care Teams.  These Care Teams include your primary Cardiologist (physician) and Advanced Practice Providers (APPs -  Physician Assistants and Nurse Practitioners) who all work together to provide you with the care you need, when you need it.  We recommend signing up for the patient portal called "MyChart".  Sign up information is provided on this After Visit Summary.  MyChart is used to connect with patients for Virtual Visits (Telemedicine).  Patients are able to view lab/test results, encounter notes, upcoming appointments, etc.  Non-urgent messages can be sent to your provider as well.   To learn more about what you can do with  MyChart, go to [email or web address].    Your next appointment:   6 week(s)  Provider:   Yates Decamp, MD     Other Instructions  . Please see if echo can be done sooner than scheduled  Dr Jacinto Halim recommends you wear below the knee support stockings.

## 2023-04-02 ENCOUNTER — Telehealth: Payer: Self-pay | Admitting: Cardiology

## 2023-04-02 NOTE — Telephone Encounter (Signed)
   Cardiac Monitor Alert  Date of alert:  04/02/2023   Patient Name: Kimberly Villegas  DOB: 17-Jul-1934  MRN: [id number]   [facility] Cardiologist: Yates Decamp, MD  [facility] EP:  Lanier Prude, MD    Monitor Information: Cardiac Event Monitor [[employer]]  Reason:  Syncope/Collapse Ordering provider:  Dr. Jacinto Halim :1}  Alert Atrial Fibrillation/Flutter This is the 1st alert for this rhythm.  The patient has a hx of Atrial Fibrillation  The patient is not currently on anticoagulation.  Anticoagulation medication as of 04/02/2023           apixaban (ELIQUIS) 5 MG TABS tablet Take 5 mg by mouth 2 (two) times daily after a meal.       Next Cardiology Appointment   Date:  05/22/2023  Provider:  Dr. Jacinto Halim  The patient could NOT be reached by telephone today.  Attempted to contact patient, no answer. Left message to return call to office. Arrhythmia, symptoms and history reviewed with Dr. Jacinto Halim.  Plan:  Monitor ordered for syncope, patient has PAF. Will continue to observe for A-fib burden also.    Franchot Gallo, RN  04/02/2023 8:32 AM

## 2023-04-02 NOTE — Telephone Encounter (Signed)
Reports pt had an auto triggered new onset atrial flutter 04/01/23 at 21:31 EST HR 76.  We have already received monitor alert via fax.

## 2023-04-02 NOTE — Telephone Encounter (Signed)
[facility] calling with an urgent EKG result. Please advise

## 2023-04-07 ENCOUNTER — Other Ambulatory Visit: Payer: Self-pay

## 2023-04-07 ENCOUNTER — Telehealth: Payer: Self-pay | Admitting: Cardiology

## 2023-04-07 DIAGNOSIS — I480 Paroxysmal atrial fibrillation: Secondary | ICD-10-CM

## 2023-04-07 DIAGNOSIS — I10 Essential (primary) hypertension: Secondary | ICD-10-CM

## 2023-04-07 MED ORDER — METOPROLOL SUCCINATE ER 25 MG PO TB24: 50.0000 mg | ORAL_TABLET | Freq: Every day | ORAL | 3 refills | Status: DC

## 2023-04-07 NOTE — Telephone Encounter (Signed)
*  STAT* If patient is at the pharmacy, call can be transferred to refill team.   1. Which medications need to be refilled? (please list name of each medication and dose if known) metoprolol succinate (TOPROL-XL) 25 MG 24 hr tablet  2. Which pharmacy/location (including street and city if local pharmacy) is medication to be sent to? [facility] - [place], [place] - [address]  3. Do they need a 30 day or 90 day supply? 90 day supply

## 2023-04-07 NOTE — Telephone Encounter (Signed)
 RX sent to requested Pharmacy

## 2023-04-08 ENCOUNTER — Ambulatory Visit: Payer: Medicare HMO | Admitting: Podiatry

## 2023-04-08 ENCOUNTER — Encounter: Payer: Self-pay | Admitting: Podiatry

## 2023-04-08 DIAGNOSIS — M79674 Pain in right toe(s): Secondary | ICD-10-CM | POA: Diagnosis not present

## 2023-04-08 DIAGNOSIS — B351 Tinea unguium: Secondary | ICD-10-CM

## 2023-04-08 DIAGNOSIS — M79675 Pain in left toe(s): Secondary | ICD-10-CM

## 2023-04-08 DIAGNOSIS — Z89429 Acquired absence of other toe(s), unspecified side: Secondary | ICD-10-CM | POA: Diagnosis not present

## 2023-04-08 DIAGNOSIS — Q828 Other specified congenital malformations of skin: Secondary | ICD-10-CM

## 2023-04-10 NOTE — Progress Notes (Signed)
  Subjective:  Patient ID: Kimberly Villegas, female    DOB: 05-10-1934,  MRN: [id number]  88 y.o. female presents to clinic with  at risk foot care. 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 She is accompanied by her husband on today's visit.   Chief Complaint  Patient presents with   Foot Pain    Non diabetic, PCP is Dr. Felipa Eth, Last oV 02/05/23.   New problem(s): None   PCP is Avva, Ravisankar, MD.  Allergies  Allergen Reactions   Vibramycin [Doxycycline] Other (See Comments)    Photosensitivity     Review of Systems: Negative except as noted in the HPI.   Objective:  Kimberly Villegas is a pleasant 88 y.o. female WD, WN in NAD. AAO x 3.  Vascular Examination: Capillary refill time <3 seconds b/l LE. Palpable pedal pulses b/l LE. Digital hair present b/l. No pedal edema b/l. Skin temperature gradient WNL b/l. No varicosities b/l. Marland Kitchen  Dermatological Examination: Pedal skin with normal turgor, texture and tone b/l. No open wounds. No interdigital macerations b/l. Toenails 1-5 b/l thickened, discolored, dystrophic with subungual debris. There is pain on palpation to dorsal aspect of nailplates.   Porokeratotic lesion(s) submet head 4 left foot. No erythema, no edema, no drainage, no fluctuance.  Neurological Examination: Protective sensation intact with 10 gram monofilament b/l LE. Vibratory sensation intact b/l LE.   Musculoskeletal Examination: Muscle strength 5/5 to all lower extremity muscle groups bilaterally. Lower extremity amputation(s): digital amputation left second digit and right second digit. Hallux valgus with bunion deformity noted b/l lower extremities.   Assessment:   1. Pain due to onychomycosis of toenails of both feet   2. Porokeratosis   3. Status post amputation of lesser toe, unspecified laterality (HCC)    Plan:  -Patient was evaluated today. All questions/concerns addressed on today's visit. -Patient to continue soft, supportive shoe gear  daily. -Mycotic toenails 3-5 bilaterally, left great toe, and right great toe were debrided in length and girth with sterile nail nippers and dremel without iatrogenic bleeding. -Porokeratotic lesion(s) submet head 4 left foot pared and enucleated with sterile currette without incident. Total number of lesions debrided=1. -Patient/POA to call should there be question/concern in the interim.  Return in about 9 weeks (around 06/10/2023).  Freddie Breech, DPM      [facility] LOCATION: [address], [place] [address]                   Office [phone]   [facility] LOCATION: [address] [place], [place] [address] Office [phone]

## 2023-05-05 ENCOUNTER — Telehealth: Payer: Self-pay | Admitting: Cardiology

## 2023-05-05 ENCOUNTER — Ambulatory Visit (HOSPITAL_COMMUNITY): Payer: Medicare HMO | Attending: Cardiology

## 2023-05-05 DIAGNOSIS — I480 Paroxysmal atrial fibrillation: Secondary | ICD-10-CM | POA: Insufficient documentation

## 2023-05-05 DIAGNOSIS — I10 Essential (primary) hypertension: Secondary | ICD-10-CM

## 2023-05-05 DIAGNOSIS — I351 Nonrheumatic aortic (valve) insufficiency: Secondary | ICD-10-CM | POA: Diagnosis not present

## 2023-05-05 LAB — ECHOCARDIOGRAM COMPLETE
Area-P 1/2: 5.27 cm2
MV M vel: 5.07 m/s
MV Peak grad: 102.8 mm[Hg]
P 1/2 time: 311 ms
Radius: 0.4 cm
S' Lateral: 3.1 cm

## 2023-05-05 NOTE — Progress Notes (Signed)
Event monitor does not reveal any low heart rate or heart block that needs pacemaker.  Her syncopal episode is related to low blood pressure.  Encouraged her to wear support stockings.  Ask her to check blood pressure laying down and standing at least 2 times a week and standing blood pressure is <120 mmHg, to call us so we can try low-dose of midodrine during daytime.

## 2023-05-05 NOTE — Telephone Encounter (Signed)
Pt c/o medication issue:  1. Name of Medication: metoprolol succinate (TOPROL-XL) 25 MG 24 hr tablet   2. How are you currently taking this medication (dosage and times per day)? Take 2 tablets (50 mg total) by mouth daily.   3. Are you having a reaction (difficulty breathing--STAT)? No   4. What is your medication issue? Patient is confused on how she needs to take medication

## 2023-05-05 NOTE — Telephone Encounter (Signed)
Called pt and pt stated on office visit 09/23/22, Dr. Jacinto Halim decreased pt's metoprolol 50 mg tablets, to metoprolol 25 mg tablet daily. This was not done please confirm that pt is supposed to be taking metoprolol 25 mg tablets daily? Please address

## 2023-05-06 ENCOUNTER — Other Ambulatory Visit (HOSPITAL_COMMUNITY): Payer: Self-pay | Admitting: Cardiology

## 2023-05-06 DIAGNOSIS — R9431 Abnormal electrocardiogram [ECG] [EKG]: Secondary | ICD-10-CM

## 2023-05-06 DIAGNOSIS — R931 Abnormal findings on diagnostic imaging of heart and coronary circulation: Secondary | ICD-10-CM

## 2023-05-06 DIAGNOSIS — I428 Other cardiomyopathies: Secondary | ICD-10-CM

## 2023-05-06 DIAGNOSIS — R55 Syncope and collapse: Secondary | ICD-10-CM

## 2023-05-06 MED ORDER — METOPROLOL SUCCINATE ER 25 MG PO TB24: 25.0000 mg | ORAL_TABLET | Freq: Every day | ORAL | 3 refills | Status: DC

## 2023-05-06 NOTE — Telephone Encounter (Signed)
Attempted to call patient three times. Each time she picks up, says hello, then hangs up (Can hear background noise). Metoprolol Succinate 25mg  daily sent to pharmacy on file with once daily dosing. Will send message via MyChart to patient.

## 2023-05-06 NOTE — Progress Notes (Signed)
Can you let patient know the echo is abnormal that is new and I will set her up for PET stress please. Indication syncope, abnormal EKG and Abnormal Echo, paroxysmal atrial fibrillation.  I have already placed the orders.

## 2023-05-06 NOTE — Progress Notes (Signed)
 ICD-10-CM   1. Syncope and collapse  R55 NM PET CT CARDIAC PERFUSION MULTI W/ABSOLUTE BLOODFLOW    Cardiac Stress Test: Informed Consent Details: Physician/Practitioner Attestation; Transcribe to consent form and obtain patient signature    2. Other cardiomyopathy (HCC)  I42.8 NM PET CT CARDIAC PERFUSION MULTI W/ABSOLUTE BLOODFLOW    Cardiac Stress Test: Informed Consent Details: Physician/Practitioner Attestation; Transcribe to consent form and obtain patient signature    3. Abnormal echocardiogram  R93.1 NM PET CT CARDIAC PERFUSION MULTI W/ABSOLUTE BLOODFLOW    Cardiac Stress Test: Informed Consent Details: Physician/Practitioner Attestation; Transcribe to consent form and obtain patient signature    4. Nonspecific abnormal electrocardiogram (ECG) (EKG)  R94.31 NM PET CT CARDIAC PERFUSION MULTI W/ABSOLUTE BLOODFLOW    Cardiac Stress Test: Informed Consent Details: Physician/Practitioner Attestation; Transcribe to consent form and obtain patient signature

## 2023-05-06 NOTE — Telephone Encounter (Signed)
 Per OV note with Ganji on 03/26/23: Patient with the past 3 months has been having unexplained frequent episodes of syncope.  It could be related to low blood pressure however on her last office visit 6 months ago at discontinued her ACE inhibitor and she is only on a low-dose of beta-blocker therapy.  Although she was orthostatic today previously when I had seen her 6 months ago she was not orthostatic.  She has increased salt intake, I will continue low-dose beta-blocker therapy for now  2. Orthostatic hypotension She is orthostatic today but previously although blood pressure was soft she was not orthostatic.  She will continue being liberal with her salt intake, will advise her to use support stockings as well.  Will follow-up after event monitor. Med list on this visit shows metoprolol  succinate 25mg  twice daily. Routing to [facility] for advisement on dosing.

## 2023-05-06 NOTE — Telephone Encounter (Signed)
 Metop succinate 25 mg daily is fine for now

## 2023-05-09 ENCOUNTER — Telehealth: Payer: Self-pay | Admitting: *Deleted

## 2023-05-09 NOTE — Telephone Encounter (Signed)
 Patient read message sent in my chart

## 2023-05-09 NOTE — Telephone Encounter (Signed)
 I spoke with patient and reviewed echo results with her.  I verbally went over instructions for cardiac PET stress test with patient and will send instructions to her through my chart.

## 2023-05-14 ENCOUNTER — Emergency Department (HOSPITAL_COMMUNITY): Payer: Medicare HMO

## 2023-05-14 ENCOUNTER — Inpatient Hospital Stay (HOSPITAL_COMMUNITY)
Admission: EM | Admit: 2023-05-14 | Discharge: 2023-05-17 | DRG: 286 | Disposition: A | Discharge status: Home / Self Care | Payer: Medicare HMO | Attending: Internal Medicine | Admitting: Internal Medicine

## 2023-05-14 ENCOUNTER — Other Ambulatory Visit: Payer: Self-pay

## 2023-05-14 ENCOUNTER — Encounter (HOSPITAL_COMMUNITY): Payer: Self-pay | Admitting: Emergency Medicine

## 2023-05-14 DIAGNOSIS — Z823 Family history of stroke: Secondary | ICD-10-CM | POA: Diagnosis not present

## 2023-05-14 DIAGNOSIS — Z79899 Other long term (current) drug therapy: Secondary | ICD-10-CM | POA: Diagnosis not present

## 2023-05-14 DIAGNOSIS — I428 Other cardiomyopathies: Secondary | ICD-10-CM | POA: Diagnosis present

## 2023-05-14 DIAGNOSIS — R0609 Other forms of dyspnea: Secondary | ICD-10-CM

## 2023-05-14 DIAGNOSIS — N179 Acute kidney failure, unspecified: Secondary | ICD-10-CM | POA: Diagnosis present

## 2023-05-14 DIAGNOSIS — I272 Pulmonary hypertension, unspecified: Secondary | ICD-10-CM | POA: Diagnosis present

## 2023-05-14 DIAGNOSIS — J9601 Acute respiratory failure with hypoxia: Secondary | ICD-10-CM | POA: Diagnosis present

## 2023-05-14 DIAGNOSIS — Z881 Allergy status to other antibiotic agents status: Secondary | ICD-10-CM | POA: Diagnosis not present

## 2023-05-14 DIAGNOSIS — Z1152 Encounter for screening for COVID-19: Secondary | ICD-10-CM

## 2023-05-14 DIAGNOSIS — I2583 Coronary atherosclerosis due to lipid rich plaque: Secondary | ICD-10-CM | POA: Diagnosis not present

## 2023-05-14 DIAGNOSIS — I130 Hypertensive heart and chronic kidney disease with heart failure and stage 1 through stage 4 chronic kidney disease, or unspecified chronic kidney disease: Principal | ICD-10-CM | POA: Diagnosis present

## 2023-05-14 DIAGNOSIS — Z87891 Personal history of nicotine dependence: Secondary | ICD-10-CM

## 2023-05-14 DIAGNOSIS — I2089 Other forms of angina pectoris: Secondary | ICD-10-CM | POA: Insufficient documentation

## 2023-05-14 DIAGNOSIS — R931 Abnormal findings on diagnostic imaging of heart and coronary circulation: Secondary | ICD-10-CM | POA: Diagnosis not present

## 2023-05-14 DIAGNOSIS — Z01810 Encounter for preprocedural cardiovascular examination: Secondary | ICD-10-CM

## 2023-05-14 DIAGNOSIS — Z86718 Personal history of other venous thrombosis and embolism: Secondary | ICD-10-CM

## 2023-05-14 DIAGNOSIS — T502X5A Adverse effect of carbonic-anhydrase inhibitors, benzothiadiazides and other diuretics, initial encounter: Secondary | ICD-10-CM | POA: Diagnosis not present

## 2023-05-14 DIAGNOSIS — I5031 Acute diastolic (congestive) heart failure: Secondary | ICD-10-CM | POA: Diagnosis not present

## 2023-05-14 DIAGNOSIS — Z7901 Long term (current) use of anticoagulants: Secondary | ICD-10-CM

## 2023-05-14 DIAGNOSIS — I2510 Atherosclerotic heart disease of native coronary artery without angina pectoris: Secondary | ICD-10-CM | POA: Diagnosis not present

## 2023-05-14 DIAGNOSIS — D631 Anemia in chronic kidney disease: Secondary | ICD-10-CM | POA: Diagnosis present

## 2023-05-14 DIAGNOSIS — N1831 Chronic kidney disease, stage 3a: Secondary | ICD-10-CM | POA: Diagnosis present

## 2023-05-14 DIAGNOSIS — I4891 Unspecified atrial fibrillation: Secondary | ICD-10-CM | POA: Diagnosis present

## 2023-05-14 DIAGNOSIS — Z800 Family history of malignant neoplasm of digestive organs: Secondary | ICD-10-CM

## 2023-05-14 DIAGNOSIS — I480 Paroxysmal atrial fibrillation: Secondary | ICD-10-CM | POA: Diagnosis not present

## 2023-05-14 DIAGNOSIS — Z85828 Personal history of other malignant neoplasm of skin: Secondary | ICD-10-CM

## 2023-05-14 DIAGNOSIS — M109 Gout, unspecified: Secondary | ICD-10-CM | POA: Diagnosis present

## 2023-05-14 DIAGNOSIS — Z8249 Family history of ischemic heart disease and other diseases of the circulatory system: Secondary | ICD-10-CM

## 2023-05-14 DIAGNOSIS — M1A9XX0 Chronic gout, unspecified, without tophus (tophi): Secondary | ICD-10-CM | POA: Diagnosis not present

## 2023-05-14 DIAGNOSIS — Z8616 Personal history of COVID-19: Secondary | ICD-10-CM | POA: Diagnosis not present

## 2023-05-14 DIAGNOSIS — I4892 Unspecified atrial flutter: Secondary | ICD-10-CM | POA: Diagnosis present

## 2023-05-14 DIAGNOSIS — E7800 Pure hypercholesterolemia, unspecified: Secondary | ICD-10-CM | POA: Diagnosis present

## 2023-05-14 DIAGNOSIS — K219 Gastro-esophageal reflux disease without esophagitis: Secondary | ICD-10-CM | POA: Diagnosis present

## 2023-05-14 DIAGNOSIS — J45909 Unspecified asthma, uncomplicated: Secondary | ICD-10-CM | POA: Diagnosis present

## 2023-05-14 DIAGNOSIS — E876 Hypokalemia: Secondary | ICD-10-CM | POA: Diagnosis not present

## 2023-05-14 DIAGNOSIS — I5033 Acute on chronic diastolic (congestive) heart failure: Secondary | ICD-10-CM | POA: Diagnosis present

## 2023-05-14 DIAGNOSIS — I4819 Other persistent atrial fibrillation: Secondary | ICD-10-CM | POA: Diagnosis present

## 2023-05-14 DIAGNOSIS — Z8701 Personal history of pneumonia (recurrent): Secondary | ICD-10-CM | POA: Insufficient documentation

## 2023-05-14 DIAGNOSIS — I951 Orthostatic hypotension: Secondary | ICD-10-CM | POA: Diagnosis not present

## 2023-05-14 DIAGNOSIS — R9431 Abnormal electrocardiogram [ECG] [EKG]: Secondary | ICD-10-CM | POA: Diagnosis not present

## 2023-05-14 DIAGNOSIS — I509 Heart failure, unspecified: Principal | ICD-10-CM

## 2023-05-14 LAB — COMPREHENSIVE METABOLIC PANEL
ALT: 21 U/L (ref 0–44)
AST: 34 U/L (ref 15–41)
Albumin: 3.4 g/dL — ABNORMAL LOW (ref 3.5–5.0)
Alkaline Phosphatase: 70 U/L (ref 38–126)
Anion gap: 10 (ref 5–15)
BUN: 12 mg/dL (ref 8–23)
CO2: 21 mmol/L — ABNORMAL LOW (ref 22–32)
Calcium: 8.9 mg/dL (ref 8.9–10.3)
Chloride: 107 mmol/L (ref 98–111)
Creatinine, Ser: 1.05 mg/dL — ABNORMAL HIGH (ref 0.44–1.00)
GFR, Estimated: 52 mL/min — ABNORMAL LOW (ref >60)
Glucose, Bld: 95 mg/dL (ref 70–99)
Potassium: 4 mmol/L (ref 3.5–5.1)
Sodium: 138 mmol/L (ref 135–145)
Total Bilirubin: 1.3 mg/dL — ABNORMAL HIGH (ref 0.0–1.2)
Total Protein: 6.2 g/dL — ABNORMAL LOW (ref 6.5–8.1)

## 2023-05-14 LAB — CBC WITH DIFFERENTIAL/PLATELET
Abs Immature Granulocytes: 0.02 10*3/uL (ref 0.00–0.07)
Basophils Absolute: 0 10*3/uL (ref 0.0–0.1)
Basophils Relative: 1 %
Eosinophils Absolute: 0 10*3/uL (ref 0.0–0.5)
Eosinophils Relative: 1 %
HCT: 32.5 % — ABNORMAL LOW (ref 36.0–46.0)
Hemoglobin: 10.7 g/dL — ABNORMAL LOW (ref 12.0–15.0)
Immature Granulocytes: 0 %
Lymphocytes Relative: 15 %
Lymphs Abs: 1 10*3/uL (ref 0.7–4.0)
MCH: 34.6 pg — ABNORMAL HIGH (ref 26.0–34.0)
MCHC: 32.9 g/dL (ref 30.0–36.0)
MCV: 105.2 fL — ABNORMAL HIGH (ref 80.0–100.0)
Monocytes Absolute: 0.6 10*3/uL (ref 0.1–1.0)
Monocytes Relative: 8 %
Neutro Abs: 5.1 10*3/uL (ref 1.7–7.7)
Neutrophils Relative %: 75 %
Platelets: 197 10*3/uL (ref 150–400)
RBC: 3.09 MIL/uL — ABNORMAL LOW (ref 3.87–5.11)
RDW: 15.6 % — ABNORMAL HIGH (ref 11.5–15.5)
WBC: 6.8 10*3/uL (ref 4.0–10.5)
nRBC: 0 % (ref 0.0–0.2)

## 2023-05-14 LAB — RESP PANEL BY RT-PCR (RSV, FLU A&B, COVID)  RVPGX2
Influenza A by PCR: NEGATIVE
Influenza B by PCR: NEGATIVE
Resp Syncytial Virus by PCR: NEGATIVE
SARS Coronavirus 2 by RT PCR: NEGATIVE

## 2023-05-14 LAB — APTT: aPTT: 38 s — ABNORMAL HIGH (ref 24–36)

## 2023-05-14 LAB — BRAIN NATRIURETIC PEPTIDE: B Natriuretic Peptide: 742 pg/mL — ABNORMAL HIGH (ref 0.0–100.0)

## 2023-05-14 LAB — HEPARIN LEVEL (UNFRACTIONATED): Heparin Unfractionated: >1.1 [IU]/mL — ABNORMAL HIGH (ref 0.30–0.70)

## 2023-05-14 LAB — TROPONIN I (HIGH SENSITIVITY): Troponin I (High Sensitivity): 12 ng/L (ref <18)

## 2023-05-14 MED ORDER — HEPARIN (PORCINE) 25000 UT/250ML-% IV SOLN
900.0000 [IU]/h | INTRAVENOUS | Status: DC
Start: 2023-05-14 — End: 2023-05-15
  Administered 2023-05-14: 900 [IU]/h via INTRAVENOUS
  Filled 2023-05-14 (×2): qty 250

## 2023-05-14 MED ORDER — FUROSEMIDE 10 MG/ML IJ SOLN
20.0000 mg | Freq: Two times a day (BID) | INTRAMUSCULAR | Status: DC
  Administered 2023-05-14: 20 mg via INTRAVENOUS
  Filled 2023-05-14: qty 2

## 2023-05-14 MED ORDER — METOPROLOL SUCCINATE ER 25 MG PO TB24
25.0000 mg | ORAL_TABLET | Freq: Every day | ORAL | Status: DC
  Administered 2023-05-14 – 2023-05-15 (×2): 25 mg via ORAL
  Filled 2023-05-14 (×2): qty 1

## 2023-05-14 MED ORDER — POTASSIUM CHLORIDE CRYS ER 10 MEQ PO TBCR
10.0000 meq | EXTENDED_RELEASE_TABLET | Freq: Every day | ORAL | Status: DC
  Administered 2023-05-14 – 2023-05-17 (×4): 10 meq via ORAL
  Filled 2023-05-14 (×6): qty 1

## 2023-05-14 MED ORDER — FUROSEMIDE 10 MG/ML IJ SOLN
40.0000 mg | Freq: Once | INTRAMUSCULAR | Status: AC
  Administered 2023-05-14: 40 mg via INTRAVENOUS
  Filled 2023-05-14: qty 4

## 2023-05-14 MED ORDER — ATORVASTATIN CALCIUM 40 MG PO TABS
40.0000 mg | ORAL_TABLET | Freq: Every day | ORAL | Status: DC
  Administered 2023-05-14 – 2023-05-17 (×4): 40 mg via ORAL
  Filled 2023-05-14 (×4): qty 1

## 2023-05-14 NOTE — H&P (Signed)
 History and Physical    Patient: Kimberly Villegas FMW:[id number] DOB: [birth year] DOA: 05/14/2023 DOS: the patient was seen and examined on 05/14/2023 PCP: Janey Santos, MD  Patient coming from: Home  Chief Complaint: No chief complaint on file.   HPI: Kimberly Villegas is a 89 y.o. female with medical history significant for HTN, CKD 3A, A-fib flutter with multiple prior cardioversions most recently 09/2021, on dronedarone  and Eliquis , HFrEF with improved EF (40%-->55%,05/05/2023), CAD, asthma and gout, recently hospitalized from 11/11 to 11/13 with acute respiratory failure secondary to rhinovirus pneumonia weaned off of oxygen prior to dischargeWho presents to the ED by EMS with shortness of breath and low O2 sats reading at home, 95% at rest 85% after walking.  She endorses ongoing fatigue.  She denied chest pain, lower extremity pain or swelling.  Denies cough.  During her recent hospitalization she was found to have bilateral pleural effusions for which she was referred to cardiology for follow-up.  Visit notes reviewed.  She complained of dyspnea on exertion and presyncopal episodes at the time.  She was orthostatic in the office.  Her echocardiogram revealed anterior wall motion abnormality and stress test was recommended.  30-day event monitor was also ordered. ED course and review: Mild tachypnea to 24, O2 sat 96% on 2 L with otherwise normal vitals. Notable findings on workup include the following: Troponin 12 and BNP 742 WBC 6.8, hemoglobin 10.7 Respiratory viral panel negative for COVID flu and RSV Creatinine at baseline EKG, personally viewed and interpreted showing prolonged QT of 554 Chest x-ray consistent with CHF  Patient treated with Lasix  in the ED with some improvement She was seen in consultation by Dr. Ladona, cardiologist while in the ED who recommended heparin  infusion with plans for         cardiac cath along with other recommendations-please see his note for detail    Hospitalist consulted for admission.   Review of Systems: As mentioned in the history of present illness. All other systems reviewed and are negative.  Past Medical History:  Diagnosis Date   A-fib (HCC)    Anemia, chronic disease    Arthritis    Asthma    Atypical atrial flutter (HCC)    Bilateral carotid bruits    CAD (coronary artery disease)    Cervical stenosis of spine    Cough    COVID 05/25/2021   Diastolic dysfunction    DVT (deep venous thrombosis) (HCC)    after vein stripping years ago   GERD (gastroesophageal reflux disease)    Hyperlipemia    Hypertension    Insomnia    New onset a-fib (HCC) 2018   Seasonal allergies    Syncope    Vitamin D deficiency    Past Surgical History:  Procedure Laterality Date   AMPUTATION TOE Bilateral 07/25/2016   Procedure: bilateral 2nd toe amputations;  Surgeon: Norleen Armor, MD;  Location: [facility];  Service: Orthopedics;  Laterality: Bilateral;   BASAL CELL CARCINOMA EXCISION     CARDIOVERSION N/A 09/10/2016   Procedure: CARDIOVERSION;  Surgeon: Ladona Heinz, MD;  Location: [facility] ENDOSCOPY;  Service: Cardiovascular;  Laterality: N/A;   CARDIOVERSION N/A 07/29/2017   Procedure: CARDIOVERSION;  Surgeon: Elmira Newman JINNY, MD;  Location: [facility] ENDOSCOPY;  Service: Cardiovascular;  Laterality: N/A;   CARDIOVERSION N/A 07/18/2020   Procedure: CARDIOVERSION;  Surgeon: Ladona Heinz, MD;  Location: [facility] ENDOSCOPY;  Service: Cardiovascular;  Laterality: N/A;   CARDIOVERSION N/A 09/04/2021   Procedure: CARDIOVERSION;  Surgeon: Ladona Heinz, MD;  Location: [facility] ENDOSCOPY;  Service: Cardiovascular;  Laterality: N/A;   FOOT SURGERY Left    JOINT REPLACEMENT Right    VEIN LIGATION AND STRIPPING Right    Social History:  reports that she quit smoking about 55 years ago. Her smoking use included cigarettes. She started smoking about 60 years ago. She has a 1.3 pack-year smoking history. She has never used smokeless tobacco. She reports that  she does not currently use alcohol  after a past usage of about 1.0 standard drink of alcohol  per week. She reports that she does not use drugs.  Allergies  Allergen Reactions   Vibramycin [Doxycycline] Other (See Comments)    Photosensitivity     Family History  Problem Relation Age of Onset   Heart disease Mother    Stroke Mother    Colon cancer Father    Heart disease Brother    Cancer Brother    Healthy Child    Healthy Child    Healthy Child    Healthy Grandchild    Healthy Grandchild    Healthy Grandchild    Healthy Grandchild    Healthy Grandchild    Breast cancer Neg Hx     Prior to Admission medications   Medication Sig Start Date End Date Taking? Authorizing Provider  apixaban  (ELIQUIS ) 5 MG TABS tablet Take 5 mg by mouth 2 (two) times daily after a meal.   Yes [provider]  atorvastatin  (LIPITOR) 40 MG tablet TAKE 1 TABLET DAILY Patient taking differently: Take 40 mg by mouth at bedtime. 10/25/22  Yes Ladona Heinz, MD  metoprolol  succinate (TOPROL  XL) 25 MG 24 hr tablet Take 1 tablet (25 mg total) by mouth daily. 05/06/23  Yes Ladona Heinz, MD  mirabegron  ER (MYRBETRIQ ) 50 MG TB24 tablet Take 50 mg by mouth at bedtime.   Yes [provider]  MULTAQ  400 MG tablet TAKE 1 TABLET TWICE A DAY AFTER MEALS Patient taking differently: Take 400 mg by mouth 2 (two) times daily with a meal. 06/24/22  Yes Ladona Heinz, MD  Multiple Vitamins-Minerals (PRESERVISION AREDS 2) CAPS Take 1 capsule by mouth 2 (two) times daily.   Yes [provider]  OVER THE COUNTER MEDICATION Take 1-2 tablets by mouth See admin instructions. OTC Bone Strength Supplement. Take 1 tablet in the morning and 2 tablets after supper.   Yes [provider]  Probiotic Product (ALIGN) 4 MG CAPS Take 4 mg by mouth daily as needed (bloating).   Yes [provider]  Wheat Dextrin (BENEFIBER PO) Take 1 Scoop by mouth daily.   Yes [provider]  zaleplon (SONATA) 5  MG capsule Take 5 mg by mouth at bedtime as needed for sleep.   Yes [provider]    Physical Exam: Vitals:   05/14/23 1557 05/14/23 1601 05/14/23 1830 05/14/23 1901  BP: (!) 179/69   (!) 159/79  Pulse: 85   (!) 108  Resp: (!) 24   17  Temp: 97.9 F (36.6 C)   97.6 F (36.4 C)  TempSrc:    Oral  SpO2: 94% 95% (!) 89% 96%   Physical Exam Vitals and nursing note reviewed.  Constitutional:      General: She is not in acute distress. HENT:     Head: Normocephalic and atraumatic.  Cardiovascular:     Rate and Rhythm: Normal rate and regular rhythm.     Heart sounds: Normal heart sounds.  Pulmonary:     Effort:  Pulmonary effort is normal.     Breath sounds: Normal breath sounds.  Abdominal:     Palpations: Abdomen is soft.     Tenderness: There is no abdominal tenderness.  Neurological:     Mental Status: Mental status is at baseline.     Labs on Admission: I have personally reviewed following labs and imaging studies  CBC: Recent Labs  Lab 05/14/23 1103  WBC 6.8  NEUTROABS 5.1  HGB 10.7*  HCT 32.5*  MCV 105.2*  PLT 197   Basic Metabolic Panel: Recent Labs  Lab 05/14/23 1103  NA 138  K 4.0  CL 107  CO2 21*  GLUCOSE 95  BUN 12  CREATININE 1.05*  CALCIUM  8.9   GFR: CrCl cannot be calculated (Unknown ideal weight.). Liver Function Tests: Recent Labs  Lab 05/14/23 1103  AST 34  ALT 21  ALKPHOS 70  BILITOT 1.3*  PROT 6.2*  ALBUMIN 3.4*   No results for input(s): LIPASE, AMYLASE in the last 168 hours. No results for input(s): AMMONIA in the last 168 hours. Coagulation Profile: No results for input(s): INR, PROTIME in the last 168 hours. Cardiac Enzymes: No results for input(s): CKTOTAL, CKMB, CKMBINDEX, TROPONINI in the last 168 hours. BNP (last 3 results) No results for input(s): PROBNP in the last 8760 hours. HbA1C: No results for input(s): HGBA1C in the last 72 hours. CBG: No results for input(s): GLUCAP in  the last 168 hours. Lipid Profile: No results for input(s): CHOL, HDL, LDLCALC, TRIG, CHOLHDL, LDLDIRECT in the last 72 hours. Thyroid  Function Tests: No results for input(s): TSH, T4TOTAL, FREET4, T3FREE, THYROIDAB in the last 72 hours. Anemia Panel: No results for input(s): VITAMINB12, FOLATE, FERRITIN, TIBC, IRON, RETICCTPCT in the last 72 hours. Urine analysis: No results found for: COLORURINE, APPEARANCEUR, LABSPEC, PHURINE, GLUCOSEU, HGBUR, BILIRUBINUR, KETONESUR, PROTEINUR, UROBILINOGEN, NITRITE, LEUKOCYTESUR  Radiological Exams on Admission: DG Chest 2 View Result Date: 05/14/2023 CLINICAL DATA:  Shortness of breath. EXAM: CHEST - 2 VIEW COMPARISON:  03/17/2023. FINDINGS: Low lung volume. Mild diffuse pulmonary vascular congestion with bilateral layering pleural effusions, favoring congestive heart failure/pulmonary edema. There are probable associated compressive atelectatic changes in the bilateral lung bases. Bilateral lung fields are otherwise clear. No pneumothorax. Stable cardio-mediastinal silhouette. No acute osseous abnormalities. The soft tissues are within normal limits. IMPRESSION: Findings favor congestive heart failure/pulmonary edema. Electronically Signed   By: Ree Molt M.D.   On: 05/14/2023 10:04     Data Reviewed: Relevant notes from primary care and specialist visits, past discharge summaries as available in EHR, including Care Everywhere. Prior diagnostic testing as pertinent to current admission diagnoses Updated medications and problem lists for reconciliation ED course, including vitals, labs, imaging, treatment and response to treatment Triage notes, nursing and pharmacy notes and ED provider's notes Notable results as noted in HPI   Assessment and Plan: * Acute on chronic diastolic (congestive) heart failure (HCC) Acute respiratory failure with hypoxia BNP 742 and chest x-ray consistent with  CHF O2 sats in the low 80s at home in the setting of  RSV pneumonia requiring oxygen in November 20 Continue IV Lasix  Daily weights with intake and output monitoring supplemental oxygen as needed  Dyspnea on exertion/ fatigue/anginal equivalent CAD History of anterior wall motion abnormality on echo 05/05/2023 Evaluated by cardiology in the ED Patient with fatigue, DOE, no chest pain, normal troponin, no ischemic changes on EKG Heparin  infusion ordered by cardiology N.p.o. for cath in the a.m. Additional recommendations per cardiologist, Dr. Ganji  Atrial fibrillation (HCC) History of prior cardioversions Currently rate controlled Patient currently on Multaq  and Eliquis  Per cardiology note, plan to switch to amiodarone  and hold Eliquis  as patient will be on heparin  Continue metoprolol   Prolonged QT interval Avoid QT prolonging drugs  History of recent RSV pneumonia with hypoxia 03/17/2023 Acute respiratory failure with hypoxia Patient required O2 during hospitalization in November 2024 weaned off prior to discharge Current hypoxia likely related to CHF versus infectious Supplemental oxygen  Chronic kidney disease, stage 3a (HCC) Renal function at baseline        DVT prophylaxis: Heparin  infusion  Consults: Cardiology, Dr. Ladona  Advance Care Planning:   Code Status: Prior   Family Communication: none  Disposition Plan: Back to previous home environment  Severity of Illness: The appropriate patient status for this patient is INPATIENT. Inpatient status is judged to be reasonable and necessary in order to provide the required intensity of service to ensure the patient's safety. The patient's presenting symptoms, physical exam findings, and initial radiographic and laboratory data in the context of their chronic comorbidities is felt to place them at high risk for further clinical deterioration. Furthermore, it is not anticipated that the patient will be medically  stable for discharge from the hospital within 2 midnights of admission.   * I certify that at the point of admission it is my clinical judgment that the patient will require inpatient hospital care spanning beyond 2 midnights from the point of admission due to high intensity of service, high risk for further deterioration and high frequency of surveillance required.*  Author: Delayne LULLA Solian, MD 05/14/2023 8:13 PM  For on call review [email or web address].

## 2023-05-14 NOTE — ED Provider Notes (Signed)
 [place] EMERGENCY DEPARTMENT AT [facility] Provider Note   CSN: [id number] Arrival date & time: 05/14/23  9140     History  No chief complaint on file.   Kimberly Villegas is a 89 y.o. female.  89 year old female with past medical history of atrial fibrillation on Eliquis  as well as hyperlipidemia and CHF presenting to the emergency department today with low oxygen saturations.  The patient had pneumonia back in December.  She apparently been having some worsening dyspnea on exertion over the past few weeks.  She did have a recent echocardiogram which was abnormal.  She was sent to the emergency department today as her pulse ox was in the mid 80s.  The patient denies any leg pain or swelling.  She denies a history of DVT or pulmonary embolism, recent surgeries, recent travel.  She denies any associated chest pain.  She denies any hemoptysis.        Home Medications Prior to Admission medications   Medication Sig Start Date End Date Taking? Authorizing Provider  apixaban  (ELIQUIS ) 5 MG TABS tablet Take 5 mg by mouth 2 (two) times daily after a meal.   Yes [provider]  atorvastatin  (LIPITOR) 40 MG tablet TAKE 1 TABLET DAILY Patient taking differently: Take 40 mg by mouth at bedtime. 10/25/22  Yes Ladona Heinz, MD  furosemide  (LASIX ) 20 MG tablet Take 1 tablet (20 mg total) by mouth daily as needed. Take if short of breath or if weight increases > 3 Lbs in 3 days 05/16/23 05/15/24 Yes Ladona Heinz, MD  metoprolol  succinate (TOPROL  XL) 25 MG 24 hr tablet Take 1 tablet (25 mg total) by mouth daily. 05/06/23  Yes Ladona Heinz, MD  mirabegron  ER (MYRBETRIQ ) 50 MG TB24 tablet Take 50 mg by mouth at bedtime.   Yes [provider]  MULTAQ  400 MG tablet TAKE 1 TABLET TWICE A DAY AFTER MEALS Patient taking differently: Take 400 mg by mouth 2 (two) times daily with a meal. 06/24/22  Yes Ladona Heinz, MD  Multiple Vitamins-Minerals (PRESERVISION AREDS 2) CAPS Take 1 capsule  by mouth 2 (two) times daily.   Yes [provider]  OVER THE COUNTER MEDICATION Take 1-2 tablets by mouth See admin instructions. OTC Bone Strength Supplement. Take 1 tablet in the morning and 2 tablets after supper.   Yes [provider]  Probiotic Product (ALIGN) 4 MG CAPS Take 4 mg by mouth daily as needed (bloating).   Yes [provider]  Wheat Dextrin (BENEFIBER PO) Take 1 Scoop by mouth daily.   Yes [provider]  zaleplon (SONATA) 5 MG capsule Take 5 mg by mouth at bedtime as needed for sleep.   Yes [provider]  amiodarone  (PACERONE ) 200 MG tablet Take 1 tablet (200 mg total) by mouth daily. 05/17/23   Ladona Heinz, MD  midodrine  (PROAMATINE ) 5 MG tablet Take 1 tablet (5 mg total) by mouth 3 (three) times daily with meals. During awake hours only. Can take extra if dizziness and BP < 100 mm Hg 05/16/23   Ladona Heinz, MD  potassium chloride  (KLOR-CON  M) 10 MEQ tablet Take 2 tablets (20 mEq total) by mouth daily as needed (Take with furosemide  only). 05/16/23   Ladona Heinz, MD      Allergies    Vibramycin [doxycycline]    Review of Systems   Review of Systems  Respiratory:  Positive for shortness of breath.   All other systems reviewed and are negative.  Physical Exam Updated Vital Signs BP (!) 120/47 (BP Location: Left Arm)   Pulse 75   Temp 98.1 F (36.7 C) (Oral)   Resp 16   Ht 5' 5 (1.651 m)   Wt 59.4 kg   SpO2 95%   BMI 21.80 kg/m  Physical Exam Vitals and nursing note reviewed.   Gen: NAD Eyes: PERRL, EOMI HEENT: no oropharyngeal swelling Neck: trachea midline Resp: Diminished with rales at bilateral lung bases Card: RRR, no murmurs, rubs, or gallops Abd: nontender, nondistended Extremities: no calf tenderness, no edema Vascular: 2+ radial pulses bilaterally, 2+ DP pulses bilaterally Skin: no rashes Psyc: acting appropriately   ED Results / Procedures / Treatments   Labs (all labs ordered are listed, but only  abnormal results are displayed) Labs Reviewed  CBC WITH DIFFERENTIAL/PLATELET - Abnormal; Notable for the following components:      Result Value   RBC 3.09 (*)    Hemoglobin 10.7 (*)    HCT 32.5 (*)    MCV 105.2 (*)    MCH 34.6 (*)    RDW 15.6 (*)    All other components within normal limits  COMPREHENSIVE METABOLIC PANEL - Abnormal; Notable for the following components:   CO2 21 (*)    Creatinine, Ser 1.05 (*)    Total Protein 6.2 (*)    Albumin 3.4 (*)    Total Bilirubin 1.3 (*)    GFR, Estimated 52 (*)    All other components within normal limits  BRAIN NATRIURETIC PEPTIDE - Abnormal; Notable for the following components:   B Natriuretic Peptide 742.0 (*)    All other components within normal limits  APTT - Abnormal; Notable for the following components:   aPTT 38 (*)    All other components within normal limits  APTT - Abnormal; Notable for the following components:   aPTT 100 (*)    All other components within normal limits  HEPARIN  LEVEL (UNFRACTIONATED) - Abnormal; Notable for the following components:   Heparin  Unfractionated >1.10 (*)    All other components within normal limits  HEPARIN  LEVEL (UNFRACTIONATED) - Abnormal; Notable for the following components:   Heparin  Unfractionated >1.10 (*)    All other components within normal limits  BASIC METABOLIC PANEL - Abnormal; Notable for the following components:   Potassium 3.4 (*)    Creatinine, Ser 1.12 (*)    Calcium  8.6 (*)    GFR, Estimated 48 (*)    All other components within normal limits  CBC - Abnormal; Notable for the following components:   RBC 3.27 (*)    Hemoglobin 11.2 (*)    HCT 33.4 (*)    MCV 102.1 (*)    MCH 34.3 (*)    All other components within normal limits  BASIC METABOLIC PANEL - Abnormal; Notable for the following components:   Glucose, Bld 138 (*)    Creatinine, Ser 1.66 (*)    Calcium  8.6 (*)    GFR, Estimated 30 (*)    All other components within normal limits  CBC - Abnormal;  Notable for the following components:   RBC 3.33 (*)    Hemoglobin 11.3 (*)    HCT 34.1 (*)    MCV 102.4 (*)    All other components within normal limits  BASIC METABOLIC PANEL - Abnormal; Notable for the following components:   Creatinine, Ser 1.15 (*)    Calcium  8.7 (*)    GFR, Estimated 46 (*)    All other components within normal limits  POCT I-STAT 7, (LYTES, BLD GAS, ICA,H+H) - Abnormal; Notable for the following components:   pH, Arterial 7.465 (*)    pO2, Arterial 65 (*)    Acid-Base Excess 4.0 (*)    Potassium 3.3 (*)    HCT 35.0 (*)    Hemoglobin 11.9 (*)    All other components within normal limits  POCT I-STAT EG7 - Abnormal; Notable for the following components:   pH, Ven 7.440 (*)    pCO2, Ven 41.7 (*)    Bicarbonate 28.3 (*)    Acid-Base Excess 4.0 (*)    Potassium 3.3 (*)    Calcium , Ion 1.12 (*)    All other components within normal limits  RESP PANEL BY RT-PCR (RSV, FLU A&B, COVID)  RVPGX2  LIPID PANEL  MAGNESIUM  MAGNESIUM  TROPONIN I (HIGH SENSITIVITY)    EKG   Radiology No results found.   Procedures Procedures    Medications Ordered in ED Medications  potassium chloride  (KLOR-CON  M) CR tablet 10 mEq (10 mEq Oral Given 05/17/23 0814)  atorvastatin  (LIPITOR) tablet 40 mg (40 mg Oral Given 05/17/23 0814)  acetaminophen  (TYLENOL ) tablet 650 mg ( Oral MAR Unhold 05/15/23 1400)    Or  acetaminophen  (TYLENOL ) suppository 650 mg ( Rectal MAR Unhold 05/15/23 1400)  HYDROcodone -acetaminophen  (NORCO/VICODIN) 5-325 MG per tablet 1-2 tablet (1 tablet Oral Given 05/16/23 1713)  albuterol  (PROVENTIL ) (2.5 MG/3ML) 0.083% nebulizer solution 2.5 mg ( Nebulization MAR Unhold 05/15/23 1400)  apixaban  (ELIQUIS ) tablet 5 mg (5 mg Oral Given 05/17/23 0814)  labetalol  (NORMODYNE ) injection 10 mg (has no administration in time range)  hydrALAZINE  (APRESOLINE ) injection 10 mg (has no administration in time range)  0.9 %  sodium chloride  infusion ( Intravenous New Bag/Given  05/15/23 1454)  sodium chloride  flush (NS) 0.9 % injection 3 mL (3 mLs Intravenous Given 05/17/23 0815)  sodium chloride  flush (NS) 0.9 % injection 3 mL (has no administration in time range)  prochlorperazine  (COMPAZINE ) injection 5 mg (has no administration in time range)  polyethylene glycol (MIRALAX  / GLYCOLAX ) packet 17 g (17 g Oral Given 05/17/23 0815)  furosemide  (LASIX ) tablet 20 mg (20 mg Oral Given 05/17/23 0814)  amiodarone  (PACERONE ) tablet 200 mg (200 mg Oral Given 05/17/23 0814)  midodrine  (PROAMATINE ) tablet 5 mg (5 mg Oral Given 05/17/23 1326)  furosemide  (LASIX ) injection 40 mg (40 mg Intravenous Given 05/14/23 1618)  aspirin  chewable tablet 81 mg (81 mg Oral Given 05/15/23 0610)  sodium chloride  0.9 % bolus 250 mL (250 mLs Intravenous New Bag/Given 05/15/23 2136)  potassium chloride  SA (KLOR-CON  M) CR tablet 20 mEq (20 mEq Oral Given 05/16/23 0843)  sodium chloride  0.9 % bolus 500 mL (500 mLs Intravenous New Bag/Given 05/16/23 0846)  potassium chloride  (KLOR-CON ) CR tablet 20 mEq (20 mEq Oral Given 05/16/23 1225)  sodium chloride  0.9 % bolus 250 mL (0 mLs Intravenous Stopped 05/17/23 0640)    ED Course/ Medical Decision Making/ A&P                                 Medical Decision Making 89 year old female with past medical history of CHF and atrial fibrillation on Eliquis  presenting to the emergency department today with dyspnea on exertion with hypoxia at home with minimal exertion.  I will further evaluate patient here with basic labs Wels and EKG, chest x-ray, troponin, BNP further evaluate for ACS, pulmonary edema, pulmonary infiltrates, or pneumothorax.  With her being on  blood thinners with no tachycardia or complaints of chest pain suspicion for pulmonary embolism is relatively low at this time.  I will reevaluate for ultimate disposition.  The patient's BNP is mildly elevated here.  The patient given 40 mg of Lasix .  Creatinine is at her baseline.  She had 2 troponins here that were  negative.  I will discuss her case with cardiology and reevaluate for ultimate disposition.  The patient was seen by cardiology and is admitted for further management.   Amount and/or Complexity of Data Reviewed Labs: ordered.  Risk Prescription drug management. Decision regarding hospitalization.           Final Clinical Impression(s) / ED Diagnoses Final diagnoses:  Acute on chronic congestive heart failure, unspecified heart failure type [facility])    Rx / DC Orders ED Discharge Orders          Ordered    amiodarone  (PACERONE ) 200 MG tablet  Daily        05/16/23 1523    Increase activity slowly        05/17/23 1306    Diet - low sodium heart healthy        05/17/23 1306    midodrine  (PROAMATINE ) 5 MG tablet  3 times daily with meals        05/16/23 1523    potassium chloride  (KLOR-CON  M) 10 MEQ tablet  Daily PRN        05/16/23 1523    furosemide  (LASIX ) 20 MG tablet  Daily PRN        05/16/23 1523              Ula Prentice SAUNDERS, MD 05/17/23 1503

## 2023-05-14 NOTE — ED Notes (Signed)
 PT req I sign MSE for her.

## 2023-05-14 NOTE — Assessment & Plan Note (Addendum)
 AKI, hypokalemia.   Renal function has improved today with serum cr at 1,15 with K at 4,1 and serum bicarbonate at 23.  Na 135 and Mg 2.0   Plan to continue close follow up renal function and electrolytes as outpatient.  Furosemide  as needed.  Consider initiation of spironolactone and SGLT 2 inh as outpatient.

## 2023-05-14 NOTE — Assessment & Plan Note (Deleted)
 Acute respiratory failure with hypoxia Patient required O2 during hospitalization in November 2024 weaned off prior to discharge Current hypoxia likely related to CHF versus infectious Supplemental oxygen

## 2023-05-14 NOTE — Assessment & Plan Note (Addendum)
 Echocardiogram with preserved LV systolic function with EF 50 to 55%, RV systolic function preserved, LA with moderate dilatation, RA with severe dilatation, moderate mitral regurgitation, no LVH.  RVSP 59.6 mmHg. Mild TR.   01/09 cardiac catheterization.  RA mean 2 PA 40/12 mean 22 PCWP 9,0  Cardiac output 6, and index 3,5 (thermodilution).  PVR 2.2 woods.   Acute on chronic core pulmonale Very mild pulmonary hypertension.   Patient was placed on furosemide  for diuresis, negative fluid balance was achieved, - 2,450 ml, with significant improvement in her symptoms.  She had a 4 Kg weight loss during her hospitalization.   Medical therapy limited due to hypotension.  Plan to continue as needed furosemide  and blood pressure support with midodrine .  Possible addition of mineralocorticoid receptor antagonist and SGLT 2 inh as outpatient if renal function continue to be stable.   Acute hypoxemic respiratory failure due to acute cardiogenic pulmonary edema, bilateral pleural effusions.  Improved with diuresis, at the time of her discharge her 02 saturation is 95% on room air.

## 2023-05-14 NOTE — Consult Note (Addendum)
 Cardiology Consultation   Patient ID: EULETA Villegas MRN: [id number]; DOB: 1934/12/27  Admit date: 05/14/2023 Date of Consult: 05/14/2023  PCP:  Janey Santos, MD   [facility] Providers Cardiologist:  Gordy Bergamo, MD  Electrophysiologist:  OLE ONEIDA HOLTS, MD       Patient Profile:   Kimberly Villegas is a 89 y.o. female with a hx of PAF now appears to have persistent atrial fibrillation, mild aortic regurgitation, hypercholesterolemia, who is being seen 05/14/2023 for the evaluation of dyspnea, hypoxemia at the request of Prentice Medicus, MD.  History of Present Illness:   Ms. Miles is a 89 y.o. female with paroxysmal atrial fibrillation, s/p cardioversion on 09/2016, 07/29/17, 07/18/2020, and 09/04/2021 with return to sinus rhythm, mild to moderate aortic and mitral regurgitation and very mild asymptomatic left subclavian artery stenosis, carotid atherosclerosis, previously hypertensive but since summer 2024, blood pressure has been soft and hyperlipidemia. She also had nonischemic cardiomyopathy with EF 40% when she was in atrial fibrillation which improved back to normal after she maintained sinus rhythm in 2018.    Patient admitted on 03/17/2023 and discharged 2 days later with rhinovirus pneumonia, I had seen her on 03/26/2023 where she was complaining of recurrent episodes of syncope for which she underwent extended EKG monitoring and echocardiogram.  She had abnormal echocardiogram revealing anterior wall motion abnormality and was recommended stress testing, extended EKG monitoring did not reveal any significant arrhythmias.  She is now admitted with hypoxemia, worsening dyspnea and elevated BNP and chest x-ray findings of pulmonary edema.  Patient states that she has received Lasix , she has used the bathroom 3 times and has noticed marked improvement in symptoms of dyspnea.  She denies chest pain.  She had severe orthopnea last night making her called EMS.  Patient states  that over the last 2 weeks or so she has not felt herself and has noticed gradually worsening fatigue and dyspnea.  She is very compliant with her medications.  Past Medical History:  Diagnosis Date   A-fib (HCC)    Anemia, chronic disease    Arthritis    Asthma    Atypical atrial flutter (HCC)    Bilateral carotid bruits    CAD (coronary artery disease)    Cervical stenosis of spine    Cough    COVID 05/25/2021   Diastolic dysfunction    DVT (deep venous thrombosis) (HCC)    after vein stripping years ago   GERD (gastroesophageal reflux disease)    Hyperlipemia    Hypertension    Insomnia    New onset a-fib (HCC) 2018   Seasonal allergies    Syncope    Vitamin D deficiency     Past Surgical History:  Procedure Laterality Date   AMPUTATION TOE Bilateral 07/25/2016   Procedure: bilateral 2nd toe amputations;  Surgeon: Norleen Armor, MD;  Location: [facility];  Service: Orthopedics;  Laterality: Bilateral;   BASAL CELL CARCINOMA EXCISION     CARDIOVERSION N/A 09/10/2016   Procedure: CARDIOVERSION;  Surgeon: Bergamo Gordy, MD;  Location: [facility] ENDOSCOPY;  Service: Cardiovascular;  Laterality: N/A;   CARDIOVERSION N/A 07/29/2017   Procedure: CARDIOVERSION;  Surgeon: Elmira Newman JINNY, MD;  Location: [facility] ENDOSCOPY;  Service: Cardiovascular;  Laterality: N/A;   CARDIOVERSION N/A 07/18/2020   Procedure: CARDIOVERSION;  Surgeon: Bergamo Gordy, MD;  Location: [facility] ENDOSCOPY;  Service: Cardiovascular;  Laterality: N/A;   CARDIOVERSION N/A 09/04/2021   Procedure: CARDIOVERSION;  Surgeon: Bergamo Gordy, MD;  Location: [facility]  ENDOSCOPY;  Service: Cardiovascular;  Laterality: N/A;   FOOT SURGERY Left    JOINT REPLACEMENT Right    VEIN LIGATION AND STRIPPING Right      Home Medications:  Prior to Admission medications   Medication Sig Start Date End Date Taking? Authorizing Provider  acetaminophen  (TYLENOL ) 325 MG tablet Take 650 mg by mouth every 6 (six) hours as needed for moderate pain  (pain score 4-6), fever or headache.    [provider]  apixaban  (ELIQUIS ) 5 MG TABS tablet Take 5 mg by mouth 2 (two) times daily after a meal.    [provider]  atorvastatin  (LIPITOR) 40 MG tablet TAKE 1 TABLET DAILY Patient taking differently: Take 40 mg by mouth at bedtime. 10/25/22   Ladona Heinz, MD  furosemide  (LASIX ) 20 MG tablet Take 0.5 tablets (10 mg total) by mouth daily. 03/19/23 03/18/24  Elgergawy, Dawood S, MD  ipratropium (ATROVENT) 0.03 % nasal spray Place 2 sprays into both nostrils daily.    [provider]  metoprolol  succinate (TOPROL  XL) 25 MG 24 hr tablet Take 1 tablet (25 mg total) by mouth daily. 05/06/23   Ladona Heinz, MD  mirabegron  ER (MYRBETRIQ ) 50 MG TB24 tablet Take 50 mg by mouth at bedtime.    [provider]  MULTAQ  400 MG tablet TAKE 1 TABLET TWICE A DAY AFTER MEALS 06/24/22   Ladona Heinz, MD  Multiple Vitamins-Minerals (PRESERVISION AREDS 2) CAPS Take 1 capsule by mouth 2 (two) times daily.    [provider]  NON FORMULARY Take 1-2 tablets by mouth See admin instructions. Bone strength supplement. Take 1 tablet in the morning and 2 tablets in the evening    [provider]  OVER THE COUNTER MEDICATION Take 1-2 tablets by mouth See admin instructions. OTC Bone Strength Supplement. Take 1 tablet in the morning and 2 tablets after supper.    [provider]  Probiotic Product (ALIGN) 4 MG CAPS Take 4 mg by mouth daily as needed (bloating).    [provider]  Wheat Dextrin (BENEFIBER PO) Take 1 Scoop by mouth daily.    [provider]  zaleplon (SONATA) 5 MG capsule Take 5 mg by mouth at bedtime as needed for sleep.    [provider]   Allergies:    Allergies  Allergen Reactions   Vibramycin [Doxycycline] Other (See Comments)    Photosensitivity     No current facility-administered medications for this encounter.  Current Outpatient Medications:    acetaminophen   (TYLENOL ) 325 MG tablet, Take 650 mg by mouth every 6 (six) hours as needed for moderate pain (pain score 4-6), fever or headache., Disp: , Rfl:    apixaban  (ELIQUIS ) 5 MG TABS tablet, Take 5 mg by mouth 2 (two) times daily after a meal., Disp: , Rfl:    atorvastatin  (LIPITOR) 40 MG tablet, TAKE 1 TABLET DAILY (Patient taking differently: Take 40 mg by mouth at bedtime.), Disp: 90 tablet, Rfl: 3   furosemide  (LASIX ) 20 MG tablet, Take 0.5 tablets (10 mg total) by mouth daily., Disp: 10 tablet, Rfl: 0   ipratropium (ATROVENT) 0.03 % nasal spray, Place 2 sprays into both nostrils daily., Disp: , Rfl:    metoprolol  succinate (TOPROL  XL) 25 MG 24 hr tablet, Take 1 tablet (25 mg total) by mouth daily., Disp: 90 tablet, Rfl: 3   mirabegron  ER (MYRBETRIQ ) 50 MG TB24 tablet, Take 50 mg by mouth at bedtime., Disp: , Rfl:    MULTAQ  400 MG tablet,  TAKE 1 TABLET TWICE A DAY AFTER MEALS, Disp: 180 tablet, Rfl: 3   Multiple Vitamins-Minerals (PRESERVISION AREDS 2) CAPS, Take 1 capsule by mouth 2 (two) times daily., Disp: , Rfl:    NON FORMULARY, Take 1-2 tablets by mouth See admin instructions. Bone strength supplement. Take 1 tablet in the morning and 2 tablets in the evening, Disp: , Rfl:    OVER THE COUNTER MEDICATION, Take 1-2 tablets by mouth See admin instructions. OTC Bone Strength Supplement. Take 1 tablet in the morning and 2 tablets after supper., Disp: , Rfl:    Probiotic Product (ALIGN) 4 MG CAPS, Take 4 mg by mouth daily as needed (bloating)., Disp: , Rfl:    Wheat Dextrin (BENEFIBER PO), Take 1 Scoop by mouth daily., Disp: , Rfl:    zaleplon (SONATA) 5 MG capsule, Take 5 mg by mouth at bedtime as needed for sleep., Disp: , Rfl:    Review of Systems  Constitutional: Positive for malaise/fatigue.  Cardiovascular:  Positive for dyspnea on exertion and paroxysmal nocturnal dyspnea. Negative for chest pain and leg swelling.   Physical Exam/Data:   Vitals:   05/14/23 1557 05/14/23 1601 05/14/23 1830  05/14/23 1901  BP: (!) 179/69   (!) 159/79  Pulse: 85   (!) 108  Resp: (!) 24   17  Temp: 97.9 F (36.6 C)   97.6 F (36.4 C)  TempSrc:    Oral  SpO2: 94% 95% (!) 89% 96%   No intake or output data in the 24 hours ending 05/14/23 1913    03/26/2023    1:16 PM 03/17/2023    8:07 AM 03/10/2023   10:56 AM  Last 3 Weights  Weight (lbs) 140 lb 12.8 oz 141 lb 1.5 oz 140 lb 12.8 oz  Weight (kg) 63.866 kg 64 kg 63.866 kg     There is no height or weight on file to calculate BMI.   Physical Exam Neck:     Vascular: No carotid bruit or JVD.  Cardiovascular:     Rate and Rhythm: Normal rate and regular rhythm.     Pulses: Intact distal pulses.     Heart sounds: S1 normal and S2 normal. Murmur heard.     Holosystolic murmur is present with a grade of 2/6 at the apex.     No gallop.  Pulmonary:     Effort: Pulmonary effort is normal.     Breath sounds: Examination of the right-middle field reveals rales. Examination of the right-lower field reveals rales. Examination of the left-lower field reveals rales. Rales present.  Abdominal:     General: Bowel sounds are normal.     Palpations: Abdomen is soft.  Musculoskeletal:     Right lower leg: No edema.     Left lower leg: No edema.     Laboratory Data:  High Sensitivity Troponin:   Recent Labs  Lab 05/14/23 1103  TROPONINIHS 12     Chemistry Recent Labs  Lab 05/14/23 1103  NA 138  K 4.0  CL 107  CO2 21*  GLUCOSE 95  BUN 12  CREATININE 1.05*  CALCIUM  8.9  GFRNONAA 52*  ANIONGAP 10       Latest Ref Rng & Units 05/14/2023   11:03 AM 03/18/2023    3:49 AM 03/17/2023    9:09 AM  BMP  Glucose 70 - 99 mg/dL 95  90  898   BUN 8 - 23 mg/dL 12  14  19    Creatinine 0.44 - 1.00  mg/dL 8.94  8.96  8.67   Sodium 135 - 145 mmol/L 138  135  140   Potassium 3.5 - 5.1 mmol/L 4.0  3.8  3.9   Chloride 98 - 111 mmol/L 107  105  109   CO2 22 - 32 mmol/L 21  23  26    Calcium  8.9 - 10.3 mg/dL 8.9  8.5  8.9     Recent Labs  Lab  05/14/23 1103  PROT 6.2*  ALBUMIN 3.4*  AST 34  ALT 21  ALKPHOS 70  BILITOT 1.3*   Lipids No results for input(s): CHOL, TRIG, HDL, LABVLDL, LDLCALC, CHOLHDL in the last 168 hours.  Hematology     Latest Ref Rng & Units 05/14/2023   11:03 AM 03/18/2023    3:49 AM 03/17/2023    9:09 AM  CBC  WBC 4.0 - 10.5 K/uL 6.8  7.8  9.1   Hemoglobin 12.0 - 15.0 g/dL 89.2  9.3  89.5   Hematocrit 36.0 - 46.0 % 32.5  28.3  31.8   Platelets 150 - 400 K/uL 197  151  161     Thyroid  No results for input(s): TSH, FREET4 in the last 168 hours.  BNP Recent Labs  Lab 05/14/23 1103  BNP 742.0*    DDimer No results for input(s): DDIMER in the last 168 hours.   Radiology/Studies:  DG Chest 2 View Result Date: 05/14/2023 CLINICAL DATA:  Shortness of breath. EXAM: CHEST - 2 VIEW COMPARISON:  03/17/2023. FINDINGS: Low lung volume. Mild diffuse pulmonary vascular congestion with bilateral layering pleural effusions, favoring congestive heart failure/pulmonary edema. There are probable associated compressive atelectatic changes in the bilateral lung bases. Bilateral lung fields are otherwise clear. No pneumothorax. Stable cardio-mediastinal silhouette. No acute osseous abnormalities. The soft tissues are within normal limits. IMPRESSION: Findings favor congestive heart failure/pulmonary edema. Electronically Signed   By: Ree Molt M.D.   On: 05/14/2023 10:04   EKG:  The EKG was personally reviewed and demonstrates:  EKG 05/14/2023: Normal sinus rhythm at rate of 85 bpm, normal axis.  Poor R progression, cannot exclude anteroseptal infarct old.  PACs.  Nonspecific sagging ST segment depression lateral leads.  Prolonged QT.  Compared to 03/18/2023, sagging ST depression in the lateral leads is new.  Telemetry:  Telemetry was personally reviewed and demonstrates: Normal sinus rhythm with frequent PACs.  Relevant CV Studies:  Echocardiogram 05/05/2023 :   1. Left ventricular ejection  fraction, by estimation, is 50 to 55%. The left ventricle has low normal function. The left ventricle demonstrates regional wall motion abnormalities (The entire anterior wall is hypokinetic, otherwise normal). Left ventricular diastolic  parameters are consistent with Grade II diastolic dysfunction (pseudonormalization). Elevated left ventricular end-diastolic pressure.  2. Right ventricular systolic function is normal. The right ventricular size is normal. There is moderately elevated pulmonary artery systolic pressure.  3. Left atrial size was moderately dilated.  4. Right atrial size was severely dilated.  5. The mitral valve is normal in structure. Moderate mitral valve regurgitation. No evidence of mitral stenosis.  6. The aortic valve is tricuspid. Aortic valve regurgitation is mild. No aortic stenosis is present.  7. The inferior vena cava is dilated in size with >50% respiratory variability, suggesting right atrial pressure of 8 mmHg.  Event monitor 30 days starting 03/26/2023. Minimum heart rate 44 bpm at 1649 hrs. and maximum heart rate 119 bpm with average heartbeat of 75 bpm. There is occasional episodes of atrial fibrillation with heart rate ranging from  54 bpm to 114 bpm with total AF burden of 4%.  Longest AF was 1 hour and 16 minutes. There was no high degree AV block.  Longest pause was 2.3 seconds x 1. Occasional brief AT with AV block noted.  Occasional PACs, burden 4% Occasional PVCs burden <1%. Auto triggered events reveal brief Wenckebach phenomena, brief atrial tachycardia with variable AV block, 2.3-second pause at 4:44 AM, PACs.  Symptomatic event (2) revealed Normal sinus rhythm.         Assessment and Plan:   Acute on chronic diastolic heart failure Abnormal echocardiogram revealing wall motion abnormality in the anterior wall Paroxysmal atrial fibrillation Chronic stage IIIa kidney disease Moderate mitral regurgitation   Risk Assessment/Risk Scores:    Click Here to Calculate/Change CHADS2VASc Score The patient's CHADS2-VASc score is 5, indicating a 7.2% annual risk of stroke.  Therefore, anticoagulation is recommended.   CHF History: Yes HTN History: No Diabetes History: No Stroke History: No Vascular Disease History: Yes   Recommendations: Patient was previously scheduled for cardiac PET stress test, however now presenting with acute decompensated heart failure, best option is to proceed with cardiac catheterization.  This the first time she is presenting with acute decompensated heart failure since her LVEF had improved after maintaining sinus rhythm.  Agree with continuing furosemide  but at 20 mg every 12 hours and replace potassium as needed.  Continue metoprolol  succinate 25 mg daily for PAF and frequent PACs.  She is presently on Multaq  however in view of heart failure, will discontinue this.  She had maintained sinus rhythm on the medication.  Will consider changing to a different agent, probably amiodarone .  Will hold Eliquis  for now and switch her to IV heparin  in view of need for cardiac catheterization possibly if she remained stable.  In view of stage III chronic kidney disease and anticipated cardiac catheterization tomorrow if she is stable, will hold off on initiation of ARB or ACE inhibitors.  Check lipid profile testing tomorrow morning.  Presently on 40 mg of atorvastatin .  Although she has moderate mitral regurgitation by recent echocardiogram, do not think this is the etiology for her heart failure.  Schedule for cardiac catheterization, and possible angioplasty. We discussed regarding risks, benefits, alternatives to this including stress testing, CTA and continued medical therapy. Patient wants to proceed. Understands <1-2% risk of death, stroke, MI, urgent CABG, bleeding, infection, renal failure but not limited to these.   For questions or updates, please contact [facility] Please consult  [email or web address] for contact info under    Signed, Gordy Bergamo, MD  05/14/2023 7:13 PM

## 2023-05-14 NOTE — Assessment & Plan Note (Addendum)
 Atrial flutter.  History of prior cardioversions Patient has been on sinus rhythm. Dronedarone has been changed to amiodarone due to heart failure.  Continue anticoagulation with apixaban.

## 2023-05-14 NOTE — ED Triage Notes (Signed)
 Pt BIB [facility] for SOB. Dx with PNA in Nov. And hasn't been right since.  She is 95% RA prior to walking, 85% RA at home after walking prior to EMS arrival.  98% 2L, EMS endorses decreased lung sounds on right.  Sinus arrhythmia with hx of afib.    154/86 HR 84, Capno= 24-27

## 2023-05-14 NOTE — ED Provider Triage Note (Signed)
 Emergency Medicine Provider Triage Evaluation Note  Kimberly Villegas , a 89 y.o. female  was evaluated in triage.  Pt complains of difficulty breathing.  Going on for at least a week.  Not sure what makes it better or worse.  Reminiscent of when she had pneumonia in November.  Denies cough congestion or fever.  She does have some chest pressure with this.  Review of Systems  Positive: Chest pressure, shortness of breath Negative: Cough or fever  Physical Exam  There were no vitals taken for this visit. Gen:   Awake, no distress   Resp:  Normal effort  MSK:   Moves extremities without difficulty  Other:  Patient is pale  Medical Decision Making  Medically screening exam initiated at 9:14 AM.  Appropriate orders placed.  Kimberly Villegas was informed that the remainder of the evaluation will be completed by another provider, this initial triage assessment does not replace that evaluation, and the importance of remaining in the ED until their evaluation is complete.  89 year old female with difficulty breathing.  Clear lung sounds for me no tachypnea.  Recently in the hospital couple months ago for pneumonia.  She is pale for me, lab tests troponin with chest pressure EKG chest x-ray   Emil Share, DO 05/14/23 0915

## 2023-05-14 NOTE — Progress Notes (Signed)
 ANTICOAGULATION CONSULT NOTE - Initial Consult  Pharmacy Consult for Heparin  Indication: atrial fibrillation  Allergies  Allergen Reactions   Vibramycin [Doxycycline] Other (See Comments)    Photosensitivity     Patient Measurements:   Heparin  Dosing Weight: 60 kg  Vital Signs: Temp: 97.6 F (36.4 C) (01/08 1901) Temp Source: Oral (01/08 1901) BP: 159/79 (01/08 1901) Pulse Rate: 108 (01/08 1901)  Labs: Recent Labs    05/14/23 1103  HGB 10.7*  HCT 32.5*  PLT 197  CREATININE 1.05*  TROPONINIHS 12    CrCl cannot be calculated (Unknown ideal weight.).   Medical History: Past Medical History:  Diagnosis Date   A-fib (HCC)    Anemia, chronic disease    Arthritis    Asthma    Atypical atrial flutter (HCC)    Bilateral carotid bruits    CAD (coronary artery disease)    Cervical stenosis of spine    Cough    COVID 05/25/2021   Diastolic dysfunction    DVT (deep venous thrombosis) (HCC)    after vein stripping years ago   GERD (gastroesophageal reflux disease)    Hyperlipemia    Hypertension    Insomnia    New onset a-fib (HCC) 2018   Seasonal allergies    Syncope    Vitamin D deficiency     Medications:  (Not in a hospital admission)  Scheduled:   atorvastatin   40 mg Oral Daily   furosemide   20 mg Intravenous Q12H   metoprolol  succinate  25 mg Oral Daily   potassium chloride   10 mEq Oral Daily   Infusions:  PRN:   Assessment: 41 yof with a history of AF s/p cardioversion, mild/mod aortic and mitral regurgitation. Patient is presenting with low oxygen saturations. Heparin  per pharmacy consult placed for atrial fibrillation.  Patient is on apixaban  prior to arrival. Last dose 1/7 pm per patient. Will require aPTT monitoring due to likely falsely high anti-Xa level secondary to DOAC use.  Hgb 10.7; plt 197  Goal of Therapy:  Heparin  level 0.3-0.7 units/ml aPTT 66-102 seconds Monitor platelets by anticoagulation protocol: Yes   Plan:  No  initial heparin  bolus Start heparin  infusion at 900 units/hr Check aPTT & anti-Xa level in 8 hours and daily while on heparin  Continue to monitor via aPTT until levels are correlated Continue to monitor H&H and platelets  Dorn Buttner, PharmD, BCPS 05/14/2023 7:25 PM ED Clinical Pharmacist -  [phone]

## 2023-05-14 NOTE — Assessment & Plan Note (Addendum)
 Patient was initially placed on IV heparin, considering her wall motion abnormalities on LV, and history of syncope she underwent cardiac catheterization with coronary angiography. Resulted with mild non obstructive coronary artery disease.

## 2023-05-14 NOTE — Assessment & Plan Note (Deleted)
Avoid QT prolonging drugs  

## 2023-05-15 ENCOUNTER — Encounter (HOSPITAL_COMMUNITY)
Admission: EM | Disposition: A | Discharge status: Home / Self Care | Payer: Self-pay | Source: Home / Self Care | Attending: Internal Medicine

## 2023-05-15 DIAGNOSIS — N1831 Chronic kidney disease, stage 3a: Secondary | ICD-10-CM | POA: Diagnosis not present

## 2023-05-15 DIAGNOSIS — I5031 Acute diastolic (congestive) heart failure: Secondary | ICD-10-CM | POA: Diagnosis not present

## 2023-05-15 DIAGNOSIS — I5033 Acute on chronic diastolic (congestive) heart failure: Secondary | ICD-10-CM | POA: Diagnosis not present

## 2023-05-15 DIAGNOSIS — I480 Paroxysmal atrial fibrillation: Secondary | ICD-10-CM | POA: Diagnosis not present

## 2023-05-15 DIAGNOSIS — I2510 Atherosclerotic heart disease of native coronary artery without angina pectoris: Secondary | ICD-10-CM | POA: Diagnosis not present

## 2023-05-15 HISTORY — PX: RIGHT/LEFT HEART CATH AND CORONARY ANGIOGRAPHY: CATH118266

## 2023-05-15 LAB — BASIC METABOLIC PANEL
Anion gap: 10 (ref 5–15)
BUN: 13 mg/dL (ref 8–23)
CO2: 25 mmol/L (ref 22–32)
Calcium: 8.6 mg/dL — ABNORMAL LOW (ref 8.9–10.3)
Chloride: 104 mmol/L (ref 98–111)
Creatinine, Ser: 1.12 mg/dL — ABNORMAL HIGH (ref 0.44–1.00)
GFR, Estimated: 48 mL/min — ABNORMAL LOW (ref >60)
Glucose, Bld: 88 mg/dL (ref 70–99)
Potassium: 3.4 mmol/L — ABNORMAL LOW (ref 3.5–5.1)
Sodium: 139 mmol/L (ref 135–145)

## 2023-05-15 LAB — POCT I-STAT EG7
Acid-Base Excess: 4 mmol/L — ABNORMAL HIGH (ref 0.0–2.0)
Bicarbonate: 28.3 mmol/L — ABNORMAL HIGH (ref 20.0–28.0)
Calcium, Ion: 1.12 mmol/L — ABNORMAL LOW (ref 1.15–1.40)
HCT: 36 % (ref 36.0–46.0)
Hemoglobin: 12.2 g/dL (ref 12.0–15.0)
O2 Saturation: 69 %
Potassium: 3.3 mmol/L — ABNORMAL LOW (ref 3.5–5.1)
Sodium: 139 mmol/L (ref 135–145)
TCO2: 30 mmol/L (ref 22–32)
pCO2, Ven: 41.7 mm[Hg] — ABNORMAL LOW (ref 44–60)
pH, Ven: 7.44 — ABNORMAL HIGH (ref 7.25–7.43)
pO2, Ven: 35 mm[Hg] (ref 32–45)

## 2023-05-15 LAB — POCT I-STAT 7, (LYTES, BLD GAS, ICA,H+H)
Acid-Base Excess: 4 mmol/L — ABNORMAL HIGH (ref 0.0–2.0)
Bicarbonate: 28 mmol/L (ref 20.0–28.0)
Calcium, Ion: 1.15 mmol/L (ref 1.15–1.40)
HCT: 35 % — ABNORMAL LOW (ref 36.0–46.0)
Hemoglobin: 11.9 g/dL — ABNORMAL LOW (ref 12.0–15.0)
O2 Saturation: 94 %
Potassium: 3.3 mmol/L — ABNORMAL LOW (ref 3.5–5.1)
Sodium: 139 mmol/L (ref 135–145)
TCO2: 29 mmol/L (ref 22–32)
pCO2 arterial: 38.9 mm[Hg] (ref 32–48)
pH, Arterial: 7.465 — ABNORMAL HIGH (ref 7.35–7.45)
pO2, Arterial: 65 mm[Hg] — ABNORMAL LOW (ref 83–108)

## 2023-05-15 LAB — LIPID PANEL
Cholesterol: 125 mg/dL (ref 0–200)
HDL: 66 mg/dL (ref >40)
LDL Cholesterol: 50 mg/dL (ref 0–99)
Total CHOL/HDL Ratio: 1.9 {ratio}
Triglycerides: 44 mg/dL (ref <150)
VLDL: 9 mg/dL (ref 0–40)

## 2023-05-15 LAB — HEPARIN LEVEL (UNFRACTIONATED): Heparin Unfractionated: >1.1 [IU]/mL — ABNORMAL HIGH (ref 0.30–0.70)

## 2023-05-15 LAB — APTT: aPTT: 100 s — ABNORMAL HIGH (ref 24–36)

## 2023-05-15 SURGERY — RIGHT/LEFT HEART CATH AND CORONARY ANGIOGRAPHY
Anesthesia: LOCAL

## 2023-05-15 MED ORDER — SODIUM CHLORIDE 0.9 % IV SOLN: 250.0000 mL | INTRAVENOUS | Status: DC | PRN

## 2023-05-15 MED ORDER — SODIUM CHLORIDE 0.9 % IV SOLN: INTRAVENOUS | Status: DC

## 2023-05-15 MED ORDER — SODIUM CHLORIDE 0.9% FLUSH
3.0000 mL | Freq: Two times a day (BID) | INTRAVENOUS | Status: DC
  Administered 2023-05-15 – 2023-05-17 (×4): 3 mL via INTRAVENOUS

## 2023-05-15 MED ORDER — MIRABEGRON ER 50 MG PO TB24
50.0000 mg | ORAL_TABLET | Freq: Every day | ORAL | Status: DC
  Administered 2023-05-15: 50 mg via ORAL
  Filled 2023-05-15: qty 1

## 2023-05-15 MED ORDER — ALBUTEROL SULFATE (2.5 MG/3ML) 0.083% IN NEBU: 2.5000 mg | INHALATION_SOLUTION | RESPIRATORY_TRACT | Status: DC | PRN

## 2023-05-15 MED ORDER — VERAPAMIL HCL 2.5 MG/ML IV SOLN
INTRAVENOUS | Status: AC
Start: 2023-05-15 — End: ?
  Filled 2023-05-15: qty 2

## 2023-05-15 MED ORDER — HEPARIN SODIUM (PORCINE) 1000 UNIT/ML IJ SOLN
INTRAMUSCULAR | Status: AC
Start: 2023-05-15 — End: ?
  Filled 2023-05-15: qty 10

## 2023-05-15 MED ORDER — LIDOCAINE HCL (PF) 1 % IJ SOLN
INTRAMUSCULAR | Status: AC
  Filled 2023-05-15: qty 30

## 2023-05-15 MED ORDER — HYDROCODONE-ACETAMINOPHEN 5-325 MG PO TABS
1.0000 | ORAL_TABLET | ORAL | Status: DC | PRN
  Administered 2023-05-16: 1 via ORAL
  Filled 2023-05-15: qty 1

## 2023-05-15 MED ORDER — ACETAMINOPHEN 650 MG RE SUPP: 650.0000 mg | Freq: Four times a day (QID) | RECTAL | Status: DC | PRN

## 2023-05-15 MED ORDER — ASPIRIN 81 MG PO CHEW: 81.0000 mg | CHEWABLE_TABLET | ORAL | Status: DC

## 2023-05-15 MED ORDER — ATORVASTATIN CALCIUM 40 MG PO TABS: 40.0000 mg | ORAL_TABLET | Freq: Every day | ORAL | Status: DC

## 2023-05-15 MED ORDER — ACETAMINOPHEN 325 MG PO TABS
650.0000 mg | ORAL_TABLET | Freq: Four times a day (QID) | ORAL | Status: DC | PRN
  Filled 2023-05-15: qty 2

## 2023-05-15 MED ORDER — APIXABAN 5 MG PO TABS
5.0000 mg | ORAL_TABLET | Freq: Two times a day (BID) | ORAL | Status: DC
  Administered 2023-05-16 – 2023-05-17 (×3): 5 mg via ORAL
  Filled 2023-05-15 (×3): qty 1

## 2023-05-15 MED ORDER — LIDOCAINE HCL (PF) 1 % IJ SOLN
INTRAMUSCULAR | Status: DC | PRN
  Administered 2023-05-15: 8 mL

## 2023-05-15 MED ORDER — IOHEXOL 350 MG/ML SOLN
INTRAVENOUS | Status: DC | PRN
  Administered 2023-05-15: 40 mL

## 2023-05-15 MED ORDER — FENTANYL CITRATE (PF) 100 MCG/2ML IJ SOLN
INTRAMUSCULAR | Status: AC
  Filled 2023-05-15: qty 2

## 2023-05-15 MED ORDER — MIDAZOLAM HCL 2 MG/2ML IJ SOLN
INTRAMUSCULAR | Status: DC | PRN
  Administered 2023-05-15: 1 mg via INTRAVENOUS

## 2023-05-15 MED ORDER — METOPROLOL SUCCINATE ER 25 MG PO TB24: 25.0000 mg | ORAL_TABLET | Freq: Every day | ORAL | Status: DC

## 2023-05-15 MED ORDER — LABETALOL HCL 5 MG/ML IV SOLN: 10.0000 mg | INTRAVENOUS | Status: AC | PRN

## 2023-05-15 MED ORDER — FENTANYL CITRATE (PF) 100 MCG/2ML IJ SOLN
INTRAMUSCULAR | Status: DC | PRN
  Administered 2023-05-15: 25 ug via INTRAVENOUS

## 2023-05-15 MED ORDER — SODIUM CHLORIDE 0.9% FLUSH: 3.0000 mL | INTRAVENOUS | Status: DC | PRN

## 2023-05-15 MED ORDER — FUROSEMIDE 10 MG/ML IJ SOLN: 40.0000 mg | Freq: Two times a day (BID) | INTRAMUSCULAR | Status: DC

## 2023-05-15 MED ORDER — HEPARIN SODIUM (PORCINE) 1000 UNIT/ML IJ SOLN
INTRAMUSCULAR | Status: DC | PRN
  Administered 2023-05-15: 4000 [IU] via INTRAVENOUS

## 2023-05-15 MED ORDER — DRONEDARONE HCL 400 MG PO TABS
400.0000 mg | ORAL_TABLET | Freq: Two times a day (BID) | ORAL | Status: DC
  Administered 2023-05-15 – 2023-05-16 (×3): 400 mg via ORAL
  Filled 2023-05-15 (×3): qty 1

## 2023-05-15 MED ORDER — VERAPAMIL HCL 2.5 MG/ML IV SOLN
INTRAVENOUS | Status: DC | PRN
  Administered 2023-05-15: 10 mL via INTRA_ARTERIAL

## 2023-05-15 MED ORDER — SODIUM CHLORIDE 0.9 % IV BOLUS
250.0000 mL | INTRAVENOUS | Status: AC
  Administered 2023-05-15: 250 mL via INTRAVENOUS

## 2023-05-15 MED ORDER — HEPARIN (PORCINE) IN NACL 1000-0.9 UT/500ML-% IV SOLN
INTRAVENOUS | Status: DC | PRN
  Administered 2023-05-15: 1000 mL

## 2023-05-15 MED ORDER — FUROSEMIDE 10 MG/ML IJ SOLN
40.0000 mg | Freq: Two times a day (BID) | INTRAMUSCULAR | Status: DC
Start: 2023-05-15 — End: 2023-05-15
  Administered 2023-05-15 (×2): 40 mg via INTRAVENOUS
  Filled 2023-05-15 (×2): qty 4

## 2023-05-15 MED ORDER — ASPIRIN 81 MG PO CHEW
81.0000 mg | CHEWABLE_TABLET | ORAL | Status: AC
  Administered 2023-05-15: 81 mg via ORAL
  Filled 2023-05-15: qty 1

## 2023-05-15 MED ORDER — SODIUM CHLORIDE 0.9 % IV SOLN: INTRAVENOUS | Status: AC

## 2023-05-15 MED ORDER — POTASSIUM CHLORIDE CRYS ER 20 MEQ PO TBCR
20.0000 meq | EXTENDED_RELEASE_TABLET | Freq: Two times a day (BID) | ORAL | Status: AC
  Administered 2023-05-15 – 2023-05-16 (×2): 20 meq via ORAL
  Filled 2023-05-15 (×2): qty 1

## 2023-05-15 MED ORDER — PROCHLORPERAZINE EDISYLATE 10 MG/2ML IJ SOLN: 5.0000 mg | Freq: Four times a day (QID) | INTRAMUSCULAR | Status: DC | PRN

## 2023-05-15 MED ORDER — MIDAZOLAM HCL 2 MG/2ML IJ SOLN
INTRAMUSCULAR | Status: AC
  Filled 2023-05-15: qty 2

## 2023-05-15 MED ORDER — HYDRALAZINE HCL 20 MG/ML IJ SOLN: 10.0000 mg | INTRAMUSCULAR | Status: AC | PRN

## 2023-05-15 SURGICAL SUPPLY — 11 items
CATH 5FR JL3.5 JR4 ANG PIG MP (CATHETERS) IMPLANT
CATH BALLN WEDGE 5F 110CM (CATHETERS) IMPLANT
DEVICE RAD COMP TR BAND LRG (VASCULAR PRODUCTS) IMPLANT
ELECT DEFIB PAD ADLT CADENCE (PAD) IMPLANT
GLIDESHEATH SLEND SS 6F .021 (SHEATH) IMPLANT
GUIDEWIRE INQWIRE 1.5J.035X260 (WIRE) IMPLANT
GUIDEWIRE TIGER .035X300 (WIRE) IMPLANT
INQWIRE 1.5J .035X260CM (WIRE) ×1
PACK CARDIAC CATHETERIZATION (CUSTOM PROCEDURE TRAY) ×1 IMPLANT
SET ATX-X65L (MISCELLANEOUS) IMPLANT
SHEATH GLIDE SLENDER 4/5FR (SHEATH) IMPLANT

## 2023-05-15 NOTE — Interval H&P Note (Signed)
 History and Physical Interval Note:  05/15/2023 1:13 PM  Kimberly Villegas  has presented today for surgery, with the diagnosis of dyspnea.  The various methods of treatment have been discussed with the patient and family. After consideration of risks, benefits and other options for treatment, the patient has consented to  Procedure(s): RIGHT/LEFT HEART CATH AND CORONARY ANGIOGRAPHY (N/A) as a surgical intervention.  The patient's history has been reviewed, patient examined, no change in status, stable for surgery.  I have reviewed the patient's chart and labs.  Questions were answered to the patient's satisfaction.     [name]

## 2023-05-15 NOTE — H&P (View-Only) (Signed)
 Patient Name: Kimberly Villegas Date of Encounter: 05/15/2023 [facility] Cardiologist: Gordy Bergamo, MD  05/14/2023 .admit Length of stay: 1  Interval Summary  .    Kimberly Villegas is a 89 y.o. female with a hx of PAF now appears to have persistent atrial fibrillation, mild aortic regurgitation, hypercholesterolemia, who is being seen 05/14/2023 for the evaluation of dyspnea, hypoxemia past medical history significant for mild to moderate aortic and mitral regurgitation, asymptomatic left subclavian artery stenosis, previously hypertensive but since summer 2024 blood pressure has been soft, hypercholesterolemia, recovered cardiomyopathy secondary to persistent atrial fibrillation now maintaining sinus rhythm on Multaq .  Patient admitted on 03/17/2023 and discharged 2 days later with rhinovirus pneumonia, I had seen her on 03/26/2023 where she was complaining of recurrent episodes of syncope for which she underwent extended EKG monitoring and echocardiogram.  She had abnormal echocardiogram revealing anterior wall motion abnormality and was recommended stress testing, extended EKG monitoring did not reveal any significant arrhythmias.  She is now admitted with hypoxemia, worsening dyspnea and elevated BNP and chest x-ray findings of pulmonary edema.   Physical Exam    Vitals:   05/15/23 0630 05/15/23 0900 05/15/23 0955 05/15/23 1015  BP: (!) 148/55 (!) 145/53    Pulse: 76 66    Resp:  15    Temp:   97.9 F (36.6 C) 97.8 F (36.6 C)  TempSrc:   Oral   SpO2: 91% 95%    Weight:      Height:       Physical Exam Neck:     Vascular: No JVD.  Cardiovascular:     Rate and Rhythm: Normal rate and regular rhythm.     Pulses: Intact distal pulses.     Heart sounds: S1 normal and S2 normal. Murmur heard.     Holosystolic murmur is present with a grade of 2/6 at the lower left sternal border.     No gallop.  Pulmonary:     Effort: Pulmonary effort is normal.     Breath sounds:  Normal breath sounds.  Abdominal:     General: Bowel sounds are normal.     Palpations: Abdomen is soft.  Musculoskeletal:     Right lower leg: No edema.     Left lower leg: No edema.       05/15/2023    4:44 AM 03/26/2023    1:16 PM 03/17/2023    8:07 AM  Last 3 Weights  Weight (lbs) 139 lb 12.8 oz 140 lb 12.8 oz 141 lb 1.5 oz  Weight (kg) 63.413 kg 63.866 kg 64 kg      Labs   Lab Results  Component Value Date   NA 138 05/14/2023   K 4.0 05/14/2023   CO2 21 (L) 05/14/2023   GLUCOSE 95 05/14/2023   BUN 12 05/14/2023   CREATININE 1.05 (H) 05/14/2023   CALCIUM  8.9 05/14/2023   EGFR 32 (L) 08/29/2021   GFRNONAA 52 (L) 05/14/2023       Latest Ref Rng & Units 05/14/2023   11:03 AM 03/18/2023    3:49 AM 03/17/2023    9:09 AM  BMP  Glucose 70 - 99 mg/dL 95  90  898   BUN 8 - 23 mg/dL 12  14  19    Creatinine 0.44 - 1.00 mg/dL 8.94  8.96  8.67   Sodium 135 - 145 mmol/L 138  135  140   Potassium 3.5 - 5.1 mmol/L 4.0  3.8  3.9  Chloride 98 - 111 mmol/L 107  105  109   CO2 22 - 32 mmol/L 21  23  26    Calcium  8.9 - 10.3 mg/dL 8.9  8.5  8.9        Latest Ref Rng & Units 05/14/2023   11:03 AM 03/18/2023    3:49 AM 03/17/2023    9:09 AM  CBC  WBC 4.0 - 10.5 K/uL 6.8  7.8  9.1   Hemoglobin 12.0 - 15.0 g/dL 89.2  9.3  89.5   Hematocrit 36.0 - 46.0 % 32.5  28.3  31.8   Platelets 150 - 400 K/uL 197  151  161    Cardiac Panel (last 3 results) Recent Labs    05/14/23 1103  TROPONINIHS 12    BNP (last 3 results) Recent Labs    03/18/23 0345 05/14/23 1103  BNP 578.5* 742.0*    Intake/Output Summary (Last 24 hours) at 05/15/2023 1016 Last data filed at 05/15/2023 9356 Gross per 24 hour  Intake --  Output 1800 ml  Net -1800 ml    Net IO Since Admission: -1,800 mL [05/15/23 1016]   Tele/EKG/Cardiac studies   Radiology/Studies:  DG Chest 2 View Result Date: 05/14/2023 CLINICAL DATA:  Shortness of breath. EXAM: CHEST - 2 VIEW COMPARISON:  03/17/2023. FINDINGS: Low  lung volume. Mild diffuse pulmonary vascular congestion with bilateral layering pleural effusions, favoring congestive heart failure/pulmonary edema. There are probable associated compressive atelectatic changes in the bilateral lung bases. Bilateral lung fields are otherwise clear. No pneumothorax. Stable cardio-mediastinal silhouette. No acute osseous abnormalities. The soft tissues are within normal limits. IMPRESSION: Findings favor congestive heart failure/pulmonary edema. Electronically Signed   By: Ree Molt M.D.   On: 05/14/2023 10:04    EKG:  The EKG was personally reviewed and demonstrates:  EKG 05/14/2023: Normal sinus rhythm at rate of 85 bpm, normal axis.  Poor R progression, cannot exclude anteroseptal infarct old.  PACs.  Nonspecific sagging ST segment depression lateral leads.  Prolonged QT.  Compared to 03/18/2023, sagging ST depression in the lateral leads is new.   Telemetry:  Telemetry was personally reviewed and demonstrates: Normal sinus rhythm with frequent PACs. No change from yesterday   Relevant CV Studies:   Echocardiogram 05/05/2023 :   1. Left ventricular ejection fraction, by estimation, is 50 to 55%. The left ventricle has low normal function. The left ventricle demonstrates regional wall motion abnormalities (The entire anterior wall is hypokinetic, otherwise normal). Left ventricular diastolic  parameters are consistent with Grade II diastolic dysfunction (pseudonormalization). Elevated left ventricular end-diastolic pressure.  2. Right ventricular systolic function is normal. The right ventricular size is normal. There is moderately elevated pulmonary artery systolic pressure.  3. Left atrial size was moderately dilated.  4. Right atrial size was severely dilated.  5. The mitral valve is normal in structure. Moderate mitral valve regurgitation. No evidence of mitral stenosis.  6. The aortic valve is tricuspid. Aortic valve regurgitation is mild. No aortic stenosis  is present.  7. The inferior vena cava is dilated in size with >50% respiratory variability, suggesting right atrial pressure of 8 mmHg.   Event monitor 30 days starting 03/26/2023. Minimum heart rate 44 bpm at 1649 hrs. and maximum heart rate 119 bpm with average heartbeat of 75 bpm. There is occasional episodes of atrial fibrillation with heart rate ranging from 54 bpm to 114 bpm with total AF burden of 4%.  Longest AF was 1 hour and 16 minutes. There was no high  degree AV block.  Longest pause was 2.3 seconds x 1. Occasional brief AT with AV block noted.  Occasional PACs, burden 4% Occasional PVCs burden <1%. Auto triggered events reveal brief Wenckebach phenomena, brief atrial tachycardia with variable AV block, 2.3-second pause at 4:44 AM, PACs.  Symptomatic event (2) revealed Normal sinus rhythm.         Current Meds:     Current Facility-Administered Medications:    0.9 %  sodium chloride  infusion, , Intravenous, Continuous, Wonda Sharper, MD, Last Rate: 10 mL/hr at 05/15/23 0612, New Bag at 05/15/23 0612   acetaminophen  (TYLENOL ) tablet 650 mg, 650 mg, Oral, Q6H PRN **OR** acetaminophen  (TYLENOL ) suppository 650 mg, 650 mg, Rectal, Q6H PRN, Cleatus Delayne GAILS, MD   albuterol  (PROVENTIL ) (2.5 MG/3ML) 0.083% nebulizer solution 2.5 mg, 2.5 mg, Nebulization, Q2H PRN, Cleatus Delayne GAILS, MD   atorvastatin  (LIPITOR) tablet 40 mg, 40 mg, Oral, Daily, Cailie Bosshart, MD, 40 mg at 05/15/23 0946   dronedarone  (MULTAQ ) tablet 400 mg, 400 mg, Oral, BID WC, Duncan, Hazel V, MD, 400 mg at 05/15/23 0946   furosemide  (LASIX ) injection 40 mg, 40 mg, Intravenous, BID, Cleatus Delayne V, MD, 40 mg at 05/15/23 0946   heparin  ADULT infusion 100 units/mL (25000 units/250mL), 900 Units/hr, Intravenous, Continuous, Ula Prentice SAUNDERS, MD, Last Rate: 9 mL/hr at 05/15/23 0649, 900 Units/hr at 05/15/23 9350   HYDROcodone -acetaminophen  (NORCO/VICODIN) 5-325 MG per tablet 1-2 tablet, 1-2 tablet, Oral, Q4H PRN, Cleatus Delayne GAILS, MD   metoprolol  succinate (TOPROL -XL) 24 hr tablet 25 mg, 25 mg, Oral, Daily, Jaquarius Seder, MD, 25 mg at 05/15/23 0946   mirabegron  ER (MYRBETRIQ ) tablet 50 mg, 50 mg, Oral, QHS, Cleatus Delayne GAILS, MD   potassium chloride  (KLOR-CON  M) CR tablet 10 mEq, 10 mEq, Oral, Daily, Ladona Heinz, MD, 10 mEq at 05/15/23 9052  Current Outpatient Medications:    apixaban  (ELIQUIS ) 5 MG TABS tablet, Take 5 mg by mouth 2 (two) times daily after a meal., Disp: , Rfl:    atorvastatin  (LIPITOR) 40 MG tablet, TAKE 1 TABLET DAILY (Patient taking differently: Take 40 mg by mouth at bedtime.), Disp: 90 tablet, Rfl: 3   metoprolol  succinate (TOPROL  XL) 25 MG 24 hr tablet, Take 1 tablet (25 mg total) by mouth daily., Disp: 90 tablet, Rfl: 3   mirabegron  ER (MYRBETRIQ ) 50 MG TB24 tablet, Take 50 mg by mouth at bedtime., Disp: , Rfl:    MULTAQ  400 MG tablet, TAKE 1 TABLET TWICE A DAY AFTER MEALS (Patient taking differently: Take 400 mg by mouth 2 (two) times daily with a meal.), Disp: 180 tablet, Rfl: 3   Multiple Vitamins-Minerals (PRESERVISION AREDS 2) CAPS, Take 1 capsule by mouth 2 (two) times daily., Disp: , Rfl:    OVER THE COUNTER MEDICATION, Take 1-2 tablets by mouth See admin instructions. OTC Bone Strength Supplement. Take 1 tablet in the morning and 2 tablets after supper., Disp: , Rfl:    Probiotic Product (ALIGN) 4 MG CAPS, Take 4 mg by mouth daily as needed (bloating)., Disp: , Rfl:    Wheat Dextrin (BENEFIBER PO), Take 1 Scoop by mouth daily., Disp: , Rfl:    zaleplon (SONATA) 5 MG capsule, Take 5 mg by mouth at bedtime as needed for sleep., Disp: , Rfl:   Assessment & Plan .     Acute on chronic diastolic heart failure Abnormal echocardiogram revealing wall motion abnormality in the anterior wall Paroxysmal atrial fibrillation Chronic stage IIIa kidney disease  Patient has been scheduled  for right and left heart catheterization, presently on IV heparin , Eliquis  has been on hold.  Patient has improved  significantly with diuresis.  No accurate I&O present in the ED.  Overall patient feels well and no further orthopnea.  Patient was on dronedarone  for maintaining sinus rhythm which she had done well, in view of heart failure I have discontinued this.  We may have to reconsider different AAD once cardiac catheterization is completed, probably consider amiodarone  upon discharge.  Not on guideline directed therapy for heart failure with regard to ACE inhibitors or ARB in view of very low blood pressure in fact it was discontinued in the outpatient basis.  Also presently patient undergoing cardiac catheterization and has underlying stage III chronic kidney disease hence we will wait for cardiac catheterization and then make further recommendation.  Continue low-dose of metoprolol  succinate.   For questions or updates, please contact [facility] Please consult [email or web address] for contact info under        Signed, Gordy Bergamo, MD, [facility] 05/15/2023, 10:16 AM [facility] [address] [place], [place] [address] Phone: [phone]. Fax:  [phone]  Cell: [phone]

## 2023-05-15 NOTE — Progress Notes (Signed)
 ANTICOAGULATION CONSULT NOTE   Pharmacy Consult for Heparin  Indication: atrial fibrillation  Allergies  Allergen Reactions   Vibramycin [Doxycycline] Other (See Comments)    Photosensitivity     Patient Measurements: Height: 5' 5 (165.1 cm) Weight: 63.4 kg (139 lb 12.8 oz) IBW/kg (Calculated) : 57 Heparin  Dosing Weight: 60 kg  Vital Signs: Temp: 98.3 F (36.8 C) (01/09 0549) Temp Source: Oral (01/09 0549) BP: 129/49 (01/09 0549) Pulse Rate: 72 (01/09 0549)  Labs: Recent Labs    05/14/23 1103 05/14/23 2025 05/15/23 0455  HGB 10.7*  --   --   HCT 32.5*  --   --   PLT 197  --   --   APTT  --  38* 100*  HEPARINUNFRC  --  >1.10* >1.10*  CREATININE 1.05*  --   --   TROPONINIHS 12  --   --     Estimated Creatinine Clearance: 34.6 mL/min (A) (by C-G formula based on SCr of 1.05 mg/dL (H)).   Medical History: Past Medical History:  Diagnosis Date   A-fib (HCC)    Anemia, chronic disease    Arthritis    Asthma    Atypical atrial flutter (HCC)    Bilateral carotid bruits    CAD (coronary artery disease)    Cervical stenosis of spine    Cough    COVID 05/25/2021   Diastolic dysfunction    DVT (deep venous thrombosis) (HCC)    after vein stripping years ago   GERD (gastroesophageal reflux disease)    Hyperlipemia    Hypertension    Insomnia    New onset a-fib (HCC) 2018   Seasonal allergies    Syncope    Vitamin D deficiency     Medications:  (Not in a hospital admission)  Scheduled:   atorvastatin   40 mg Oral Daily   dronedarone   400 mg Oral BID WC   furosemide   40 mg Intravenous BID   metoprolol  succinate  25 mg Oral Daily   mirabegron  ER  50 mg Oral QHS   potassium chloride   10 mEq Oral Daily   Infusions:   sodium chloride      heparin  900 Units/hr (05/14/23 2131)   PRN: acetaminophen  **OR** acetaminophen , albuterol , HYDROcodone -acetaminophen   Assessment: 87 yof with a history of AF s/p cardioversion, mild/mod aortic and mitral  regurgitation. Patient is presenting with low oxygen saturations. Heparin  per pharmacy consult placed for atrial fibrillation.  Patient is on apixaban  prior to arrival. Last dose 1/7 pm per patient. Will require aPTT monitoring due to likely falsely high anti-Xa level secondary to DOAC use.  Hgb 10.7; plt 197  1/9 AM update:  aPTT therapeutic   Goal of Therapy:  Heparin  level 0.3-0.7 units/ml aPTT 66-102 seconds Monitor platelets by anticoagulation protocol: Yes   Plan:  Cont heparin  infusion at 900 units/hr Check aPTT & anti-Xa level in 8 hours and daily while on heparin  Continue to monitor via aPTT until levels are correlated Continue to monitor H&H and platelets  Lynwood Mckusick, PharmD, BCPS Clinical Pharmacist Phone: [phone]

## 2023-05-15 NOTE — Progress Notes (Signed)
 PROGRESS NOTE    Kimberly Villegas  FMW:[id number] DOB: 09/22/1934 DOA: 05/14/2023 PCP: Janey Santos, MD    Brief Narrative:   Kimberly Villegas is a 89 y.o. female with past medical history significant for HTN, CKD 3A, A-fib/flutter with multiple prior cardioversions most recently 09/2021, on dronedarone  and Eliquis , HFrEF with improved EF (40%-->55%,05/05/2023), CAD, asthma and gout, recently hospitalized from 11/11 to 11/13 with acute respiratory failure secondary to rhinovirus pneumonia weaned off of oxygen prior to discharge who presented to [facility] ED on 1/8 from home via EMS with shortness of breath and low O2 sats reading at home, 95% at rest 85% after walking.  She endorses ongoing fatigue.  She denied chest pain, lower extremity pain or swelling.  Denies cough.  During her recent hospitalization she was found to have bilateral pleural effusions for which she was referred to cardiology for follow-up.    In the ED, temperature 98.1 F, HR 73, RR 24, BP 179/69, SpO2 89% on room air.  WBC 6.8, hemoglobin 10.7, plate count 802.  Sodium 138, potassium 4.0, chloride 107, CO2 21, glucose 95, BUN 12, creatinine 1.05.  AST 34, ALT 21, total bilirubin 1.3.  BNP 742.0.  High sensitive troponin 12.  COVID/RSV/influenza PCR negative.  Chest x-ray with pulmonary edema.  Patient was given IV Lasix  in the ED.  Cardiology was consulted.  Heparin  drip initiated with plans for cardiac catheterization.  TRH consulted for admission for further evaluation and management.   Assessment & Plan:   Acute respiratory failure with hypoxia Acute on chronic diastolic congestive heart failure Patient presenting to ED with progressive shortness of breath was noted to be hypoxic at home on ambulation with SpO2 85%.  COVID/RSV/influenza PCR negative.  BNP elevated 742.0.  Chest x-ray remarkable for pulmonary edema. -- Cardiology following, appreciate assistance -- net negative 1.8L past 24h w/ 2 unmeasured urinary  occurrences -- Lasix  40 mg IV every 12 hours -- Strict I's and O's and daily weights -- Continue supplemental oxygen to maintain SpO2 greater than 92%, wean as able -- BMP daily  CAD Patient with history of anterior wall motion abnormality on echo 05/05/2023.  Patient complaining of fatigue, dyspnea on exertion and mild chest pressure.  Troponin within normal limits, no ischemic changes noted on EKG.  Seen by cardiology and started on heparin  infusion. -- Cardiology following, appreciate assistance --Heparin  drip -- Left heart catheterization 1/9 with mild nonobstructive CAD mild 30% proximal RCA stenosis, 40-50% LAD stenosis just after the first diagonal.  A-fib/flutter on anticoagulation -- Metoprolol  succinate 25 mg p.o. daily -- Dronedarone  400 mg PO BID -- Heparin  drip, plan to transition back to Eliquis  tomorrow  Hyperlipidemia -- Atorvastatin  40 mg p.o. daily  CKD stage IIIa -- Cr 1.05; stable at baseline  Asthma -- Albuterol  every 2 hours as needed wheezing/shortness of breath   DVT prophylaxis: Heparin  drip    Code Status: Full Code Family Communication: No family present at bedside  Disposition Plan:  Level of care: Progressive Status is: Inpatient Remains inpatient appropriate because: Left heart catheterization, heparin  drip, potential discharge home tomorrow    Consultants:  Cardiology  Procedures:  Left heart catheterization  Antimicrobials:  None   Subjective: Patient examined at bedside, resting comfortably.  Sitting at edge of bed in ED holding area.  RN present.  Reports chest pressure has resolved, shortness of breath markedly improved.  Pending left heart catheterization later this morning.  No other specific questions, concerns or complaints at this time.  Denies headache, no dizziness, no chest pain, no palpitation, no abdominal pain, no fever/chills/night sweats, no nausea/vomiting/diarrhea, no focal weakness, no fatigue, no paresthesia.  No  acute events overnight per nurse staff.  Objective: Vitals:   05/15/23 1334 05/15/23 1339 05/15/23 1344 05/15/23 1349  BP: (!) 115/50 (!) 120/48 (!) 125/57 125/63  Pulse: 80 72 85 78  Resp: 16 14 19 18   Temp:      TempSrc:      SpO2: 92% 92% 93%   Weight:      Height:        Intake/Output Summary (Last 24 hours) at 05/15/2023 1404 Last data filed at 05/15/2023 0643 Gross per 24 hour  Intake --  Output 1800 ml  Net -1800 ml   Filed Weights   05/15/23 0444  Weight: 63.4 kg    Examination:  Physical Exam: GEN: NAD, alert and oriented x 3, wd/wn HEENT: NCAT, PERRL, EOMI, sclera clear, MMM PULM: CTAB w/o wheezes/crackles, normal respiratory effort, on room air with SpO2 97% at rest CV: RRR w/o M/G/R GI: abd soft, NTND, NABS, no R/G/M MSK: no peripheral edema, muscle strength globally intact 5/5 bilateral upper/lower extremities NEURO: CN II-XII intact, no focal deficits, sensation to light touch intact PSYCH: normal mood/affect Integumentary: dry/intact, no rashes or wounds    Data Reviewed: I have personally reviewed following labs and imaging studies  CBC: Recent Labs  Lab 05/14/23 1103 05/15/23 1333 05/15/23 1334  WBC 6.8  --   --   NEUTROABS 5.1  --   --   HGB 10.7* 12.2 11.9*  HCT 32.5* 36.0 35.0*  MCV 105.2*  --   --   PLT 197  --   --    Basic Metabolic Panel: Recent Labs  Lab 05/14/23 1103 05/15/23 0949 05/15/23 1333 05/15/23 1334  NA 138 139 139 139  K 4.0 3.4* 3.3* 3.3*  CL 107 104  --   --   CO2 21* 25  --   --   GLUCOSE 95 88  --   --   BUN 12 13  --   --   CREATININE 1.05* 1.12*  --   --   CALCIUM  8.9 8.6*  --   --    GFR: Estimated Creatinine Clearance: 32.4 mL/min (A) (by C-G formula based on SCr of 1.12 mg/dL (H)). Liver Function Tests: Recent Labs  Lab 05/14/23 1103  AST 34  ALT 21  ALKPHOS 70  BILITOT 1.3*  PROT 6.2*  ALBUMIN 3.4*   No results for input(s): LIPASE, AMYLASE in the last 168 hours. No results for  input(s): AMMONIA in the last 168 hours. Coagulation Profile: No results for input(s): INR, PROTIME in the last 168 hours. Cardiac Enzymes: No results for input(s): CKTOTAL, CKMB, CKMBINDEX, TROPONINI in the last 168 hours. BNP (last 3 results) No results for input(s): PROBNP in the last 8760 hours. HbA1C: No results for input(s): HGBA1C in the last 72 hours. CBG: No results for input(s): GLUCAP in the last 168 hours. Lipid Profile: Recent Labs    05/15/23 0949  CHOL 125  HDL 66  LDLCALC 50  TRIG 44  CHOLHDL 1.9   Thyroid  Function Tests: No results for input(s): TSH, T4TOTAL, FREET4, T3FREE, THYROIDAB in the last 72 hours. Anemia Panel: No results for input(s): VITAMINB12, FOLATE, FERRITIN, TIBC, IRON, RETICCTPCT in the last 72 hours. Sepsis Labs: No results for input(s): PROCALCITON, LATICACIDVEN in the last 168 hours.  Recent Results (from the past 240 hours)  Resp  panel by RT-PCR (RSV, Flu A&B, Covid) Anterior Nasal Swab     Status: None   Collection Time: 05/14/23  9:26 AM   Specimen: Anterior Nasal Swab  Result Value Ref Range Status   SARS Coronavirus 2 by RT PCR NEGATIVE NEGATIVE Final   Influenza A by PCR NEGATIVE NEGATIVE Final   Influenza B by PCR NEGATIVE NEGATIVE Final    Comment: (NOTE) The Xpert Xpress SARS-CoV-2/FLU/RSV plus assay is intended as an aid in the diagnosis of influenza from Nasopharyngeal swab specimens and should not be used as a sole basis for treatment. Nasal washings and aspirates are unacceptable for Xpert Xpress SARS-CoV-2/FLU/RSV testing.  Fact Sheet for Patients: bloggercourse.com  Fact Sheet for Healthcare Providers: seriousbroker.it  This test is not yet approved or cleared by the [place]  FDA and has been authorized for detection and/or diagnosis of SARS-CoV-2 by FDA under an Emergency Use Authorization (EUA). This EUA will  remain in effect (meaning this test can be used) for the duration of the COVID-19 declaration under Section 564(b)(1) of the Act, 21 U.S.C. section 360bbb-3(b)(1), unless the authorization is terminated or revoked.     Resp Syncytial Virus by PCR NEGATIVE NEGATIVE Final    Comment: (NOTE) Fact Sheet for Patients: bloggercourse.com  Fact Sheet for Healthcare Providers: seriousbroker.it  This test is not yet approved or cleared by the [place]  FDA and has been authorized for detection and/or diagnosis of SARS-CoV-2 by FDA under an Emergency Use Authorization (EUA). This EUA will remain in effect (meaning this test can be used) for the duration of the COVID-19 declaration under Section 564(b)(1) of the Act, 21 U.S.C. section 360bbb-3(b)(1), unless the authorization is terminated or revoked.  Performed at [facility], [address] [place], [place] [address]          Radiology Studies: CARDIAC CATHETERIZATION Result Date: 05/15/2023 1.  Mild nonobstructive CAD with patency of the left main and left circumflex with no stenoses, mild 30% proximal RCA stenosis, and 40 to 50% LAD stenosis just after the first diagonal 2.  Normal LVEDP of 7 mmHg 3.  Normal wedge pressure of 9 mmHg, mean PA pressure 22 mmHg, transpulmonary gradient 13 mmHg, PVR 2.1 Wood units Recommendations: Medical therapy, patient appears well compensated from a heart failure standpoint.  Okay to resume apixaban  tomorrow morning.   DG Chest 2 View Result Date: 05/14/2023 CLINICAL DATA:  Shortness of breath. EXAM: CHEST - 2 VIEW COMPARISON:  03/17/2023. FINDINGS: Low lung volume. Mild diffuse pulmonary vascular congestion with bilateral layering pleural effusions, favoring congestive heart failure/pulmonary edema. There are probable associated compressive atelectatic changes in the bilateral lung bases. Bilateral lung fields are otherwise clear. No  pneumothorax. Stable cardio-mediastinal silhouette. No acute osseous abnormalities. The soft tissues are within normal limits. IMPRESSION: Findings favor congestive heart failure/pulmonary edema. Electronically Signed   By: Ree Molt M.D.   On: 05/14/2023 10:04        Scheduled Meds:  atorvastatin   40 mg Oral Daily   dronedarone   400 mg Oral BID WC   furosemide   40 mg Intravenous BID   metoprolol  succinate  25 mg Oral Daily   mirabegron  ER  50 mg Oral QHS   potassium chloride   10 mEq Oral Daily   Continuous Infusions:  heparin  Stopped (05/15/23 1252)     LOS: 1 day    Time spent: 56 minutes spent on chart review, discussion with nursing staff, consultants, updating family and interview/physical exam; more than 50% of  that time was spent in counseling and/or coordination of care.    Camellia PARAS Jalesa Thien, DO [facility] Available via Epic secure chat 7am-7pm After these hours, please refer to coverage provider listed on [email or web address] 05/15/2023, 2:04 PM

## 2023-05-15 NOTE — Progress Notes (Signed)
 Heart Failure Navigator Progress Note  Assessed for Heart & Vascular TOC clinic readiness.  Patient has a scheduled [facility] appointment on 05/22/2023. No TOC. .   Navigator will sign off at this time.   Stephane Haddock, BSN, Scientist, Clinical (histocompatibility And Immunogenetics) Only

## 2023-05-15 NOTE — Progress Notes (Addendum)
 Pt going to bathroom and while on toilet c/o being dizzy and while assisting pt to bed pt became weak and passed out with urinary incontinence. Able to get pt to bed without fall and pt rebounded becoming alert and oriented to baseline. IM medicine on unit and came to assess pt and Rapid response nurse called and assessed pt. [facility] MD paged and came to unit to assess pt. Will update date as orders are given.

## 2023-05-15 NOTE — Significant Event (Signed)
 Rapid Response Event Note   Reason for Call :  Syncopy  Per RN, pt had a witnessed sycopal episode while walking back from bathroom. Pt was placed back in bed. She did not fall/hit her head during this episode.   Initial Focused Assessment:  Pt lying in bed with eyes open, in no visible distress. She is alert and oriented, denies CP/SOB/dizziness. Pupils 5, equal, and reactive. Her skin is warm and dry.   HR-95, BP-121/74, RR-20, SpO2-95% on RA   Interventions:  NS 250cc bolus CBC/BMP/Mg Orthostatic VS Plan of Care:  Probable orthostatic hypotension. Pt VS currently stable. Given bolus and monitor response. Await lab values. Please call RRT if further assistance needed.    Event Summary:   MD Notified:  Call Time:2013 Arrival Time:2016 End Time:2020  Tish Graeme Piety, RN

## 2023-05-15 NOTE — Plan of Care (Signed)
 Care plan reviewed.

## 2023-05-15 NOTE — Progress Notes (Signed)
 Patient Name: Kimberly Villegas Date of Encounter: 05/15/2023 [facility] Cardiologist: Gordy Bergamo, MD  05/14/2023 .admit Length of stay: 1  Interval Summary  .    Kimberly Villegas is a 89 y.o. female with a hx of PAF now appears to have persistent atrial fibrillation, mild aortic regurgitation, hypercholesterolemia, who is being seen 05/14/2023 for the evaluation of dyspnea, hypoxemia past medical history significant for mild to moderate aortic and mitral regurgitation, asymptomatic left subclavian artery stenosis, previously hypertensive but since summer 2024 blood pressure has been soft, hypercholesterolemia, recovered cardiomyopathy secondary to persistent atrial fibrillation now maintaining sinus rhythm on Multaq .  Patient admitted on 03/17/2023 and discharged 2 days later with rhinovirus pneumonia, I had seen her on 03/26/2023 where she was complaining of recurrent episodes of syncope for which she underwent extended EKG monitoring and echocardiogram.  She had abnormal echocardiogram revealing anterior wall motion abnormality and was recommended stress testing, extended EKG monitoring did not reveal any significant arrhythmias.  She is now admitted with hypoxemia, worsening dyspnea and elevated BNP and chest x-ray findings of pulmonary edema.   Physical Exam    Vitals:   05/15/23 0630 05/15/23 0900 05/15/23 0955 05/15/23 1015  BP: (!) 148/55 (!) 145/53    Pulse: 76 66    Resp:  15    Temp:   97.9 F (36.6 C) 97.8 F (36.6 C)  TempSrc:   Oral   SpO2: 91% 95%    Weight:      Height:       Physical Exam Neck:     Vascular: No JVD.  Cardiovascular:     Rate and Rhythm: Normal rate and regular rhythm.     Pulses: Intact distal pulses.     Heart sounds: S1 normal and S2 normal. Murmur heard.     Holosystolic murmur is present with a grade of 2/6 at the lower left sternal border.     No gallop.  Pulmonary:     Effort: Pulmonary effort is normal.     Breath sounds:  Normal breath sounds.  Abdominal:     General: Bowel sounds are normal.     Palpations: Abdomen is soft.  Musculoskeletal:     Right lower leg: No edema.     Left lower leg: No edema.       05/15/2023    4:44 AM 03/26/2023    1:16 PM 03/17/2023    8:07 AM  Last 3 Weights  Weight (lbs) 139 lb 12.8 oz 140 lb 12.8 oz 141 lb 1.5 oz  Weight (kg) 63.413 kg 63.866 kg 64 kg      Labs   Lab Results  Component Value Date   NA 138 05/14/2023   K 4.0 05/14/2023   CO2 21 (L) 05/14/2023   GLUCOSE 95 05/14/2023   BUN 12 05/14/2023   CREATININE 1.05 (H) 05/14/2023   CALCIUM  8.9 05/14/2023   EGFR 32 (L) 08/29/2021   GFRNONAA 52 (L) 05/14/2023       Latest Ref Rng & Units 05/14/2023   11:03 AM 03/18/2023    3:49 AM 03/17/2023    9:09 AM  BMP  Glucose 70 - 99 mg/dL 95  90  898   BUN 8 - 23 mg/dL 12  14  19    Creatinine 0.44 - 1.00 mg/dL 8.94  8.96  8.67   Sodium 135 - 145 mmol/L 138  135  140   Potassium 3.5 - 5.1 mmol/L 4.0  3.8  3.9  Chloride 98 - 111 mmol/L 107  105  109   CO2 22 - 32 mmol/L 21  23  26    Calcium  8.9 - 10.3 mg/dL 8.9  8.5  8.9        Latest Ref Rng & Units 05/14/2023   11:03 AM 03/18/2023    3:49 AM 03/17/2023    9:09 AM  CBC  WBC 4.0 - 10.5 K/uL 6.8  7.8  9.1   Hemoglobin 12.0 - 15.0 g/dL 89.2  9.3  89.5   Hematocrit 36.0 - 46.0 % 32.5  28.3  31.8   Platelets 150 - 400 K/uL 197  151  161    Cardiac Panel (last 3 results) Recent Labs    05/14/23 1103  TROPONINIHS 12    BNP (last 3 results) Recent Labs    03/18/23 0345 05/14/23 1103  BNP 578.5* 742.0*    Intake/Output Summary (Last 24 hours) at 05/15/2023 1016 Last data filed at 05/15/2023 9356 Gross per 24 hour  Intake --  Output 1800 ml  Net -1800 ml    Net IO Since Admission: -1,800 mL [05/15/23 1016]   Tele/EKG/Cardiac studies   Radiology/Studies:  DG Chest 2 View Result Date: 05/14/2023 CLINICAL DATA:  Shortness of breath. EXAM: CHEST - 2 VIEW COMPARISON:  03/17/2023. FINDINGS: Low  lung volume. Mild diffuse pulmonary vascular congestion with bilateral layering pleural effusions, favoring congestive heart failure/pulmonary edema. There are probable associated compressive atelectatic changes in the bilateral lung bases. Bilateral lung fields are otherwise clear. No pneumothorax. Stable cardio-mediastinal silhouette. No acute osseous abnormalities. The soft tissues are within normal limits. IMPRESSION: Findings favor congestive heart failure/pulmonary edema. Electronically Signed   By: Ree Molt M.D.   On: 05/14/2023 10:04    EKG:  The EKG was personally reviewed and demonstrates:  EKG 05/14/2023: Normal sinus rhythm at rate of 85 bpm, normal axis.  Poor R progression, cannot exclude anteroseptal infarct old.  PACs.  Nonspecific sagging ST segment depression lateral leads.  Prolonged QT.  Compared to 03/18/2023, sagging ST depression in the lateral leads is new.   Telemetry:  Telemetry was personally reviewed and demonstrates: Normal sinus rhythm with frequent PACs. No change from yesterday   Relevant CV Studies:   Echocardiogram 05/05/2023 :   1. Left ventricular ejection fraction, by estimation, is 50 to 55%. The left ventricle has low normal function. The left ventricle demonstrates regional wall motion abnormalities (The entire anterior wall is hypokinetic, otherwise normal). Left ventricular diastolic  parameters are consistent with Grade II diastolic dysfunction (pseudonormalization). Elevated left ventricular end-diastolic pressure.  2. Right ventricular systolic function is normal. The right ventricular size is normal. There is moderately elevated pulmonary artery systolic pressure.  3. Left atrial size was moderately dilated.  4. Right atrial size was severely dilated.  5. The mitral valve is normal in structure. Moderate mitral valve regurgitation. No evidence of mitral stenosis.  6. The aortic valve is tricuspid. Aortic valve regurgitation is mild. No aortic stenosis  is present.  7. The inferior vena cava is dilated in size with >50% respiratory variability, suggesting right atrial pressure of 8 mmHg.   Event monitor 30 days starting 03/26/2023. Minimum heart rate 44 bpm at 1649 hrs. and maximum heart rate 119 bpm with average heartbeat of 75 bpm. There is occasional episodes of atrial fibrillation with heart rate ranging from 54 bpm to 114 bpm with total AF burden of 4%.  Longest AF was 1 hour and 16 minutes. There was no high  degree AV block.  Longest pause was 2.3 seconds x 1. Occasional brief AT with AV block noted.  Occasional PACs, burden 4% Occasional PVCs burden <1%. Auto triggered events reveal brief Wenckebach phenomena, brief atrial tachycardia with variable AV block, 2.3-second pause at 4:44 AM, PACs.  Symptomatic event (2) revealed Normal sinus rhythm.         Current Meds:     Current Facility-Administered Medications:    0.9 %  sodium chloride  infusion, , Intravenous, Continuous, Wonda Sharper, MD, Last Rate: 10 mL/hr at 05/15/23 0612, New Bag at 05/15/23 0612   acetaminophen  (TYLENOL ) tablet 650 mg, 650 mg, Oral, Q6H PRN **OR** acetaminophen  (TYLENOL ) suppository 650 mg, 650 mg, Rectal, Q6H PRN, Cleatus Delayne GAILS, MD   albuterol  (PROVENTIL ) (2.5 MG/3ML) 0.083% nebulizer solution 2.5 mg, 2.5 mg, Nebulization, Q2H PRN, Cleatus Delayne GAILS, MD   atorvastatin  (LIPITOR) tablet 40 mg, 40 mg, Oral, Daily, Cailie Bosshart, MD, 40 mg at 05/15/23 0946   dronedarone  (MULTAQ ) tablet 400 mg, 400 mg, Oral, BID WC, Duncan, Hazel V, MD, 400 mg at 05/15/23 0946   furosemide  (LASIX ) injection 40 mg, 40 mg, Intravenous, BID, Cleatus Delayne V, MD, 40 mg at 05/15/23 0946   heparin  ADULT infusion 100 units/mL (25000 units/250mL), 900 Units/hr, Intravenous, Continuous, Ula Prentice SAUNDERS, MD, Last Rate: 9 mL/hr at 05/15/23 0649, 900 Units/hr at 05/15/23 9350   HYDROcodone -acetaminophen  (NORCO/VICODIN) 5-325 MG per tablet 1-2 tablet, 1-2 tablet, Oral, Q4H PRN, Cleatus Delayne GAILS, MD   metoprolol  succinate (TOPROL -XL) 24 hr tablet 25 mg, 25 mg, Oral, Daily, Jaquarius Seder, MD, 25 mg at 05/15/23 0946   mirabegron  ER (MYRBETRIQ ) tablet 50 mg, 50 mg, Oral, QHS, Cleatus Delayne GAILS, MD   potassium chloride  (KLOR-CON  M) CR tablet 10 mEq, 10 mEq, Oral, Daily, Ladona Heinz, MD, 10 mEq at 05/15/23 9052  Current Outpatient Medications:    apixaban  (ELIQUIS ) 5 MG TABS tablet, Take 5 mg by mouth 2 (two) times daily after a meal., Disp: , Rfl:    atorvastatin  (LIPITOR) 40 MG tablet, TAKE 1 TABLET DAILY (Patient taking differently: Take 40 mg by mouth at bedtime.), Disp: 90 tablet, Rfl: 3   metoprolol  succinate (TOPROL  XL) 25 MG 24 hr tablet, Take 1 tablet (25 mg total) by mouth daily., Disp: 90 tablet, Rfl: 3   mirabegron  ER (MYRBETRIQ ) 50 MG TB24 tablet, Take 50 mg by mouth at bedtime., Disp: , Rfl:    MULTAQ  400 MG tablet, TAKE 1 TABLET TWICE A DAY AFTER MEALS (Patient taking differently: Take 400 mg by mouth 2 (two) times daily with a meal.), Disp: 180 tablet, Rfl: 3   Multiple Vitamins-Minerals (PRESERVISION AREDS 2) CAPS, Take 1 capsule by mouth 2 (two) times daily., Disp: , Rfl:    OVER THE COUNTER MEDICATION, Take 1-2 tablets by mouth See admin instructions. OTC Bone Strength Supplement. Take 1 tablet in the morning and 2 tablets after supper., Disp: , Rfl:    Probiotic Product (ALIGN) 4 MG CAPS, Take 4 mg by mouth daily as needed (bloating)., Disp: , Rfl:    Wheat Dextrin (BENEFIBER PO), Take 1 Scoop by mouth daily., Disp: , Rfl:    zaleplon (SONATA) 5 MG capsule, Take 5 mg by mouth at bedtime as needed for sleep., Disp: , Rfl:   Assessment & Plan .     Acute on chronic diastolic heart failure Abnormal echocardiogram revealing wall motion abnormality in the anterior wall Paroxysmal atrial fibrillation Chronic stage IIIa kidney disease  Patient has been scheduled  for right and left heart catheterization, presently on IV heparin , Eliquis  has been on hold.  Patient has improved  significantly with diuresis.  No accurate I&O present in the ED.  Overall patient feels well and no further orthopnea.  Patient was on dronedarone  for maintaining sinus rhythm which she had done well, in view of heart failure I have discontinued this.  We may have to reconsider different AAD once cardiac catheterization is completed, probably consider amiodarone  upon discharge.  Not on guideline directed therapy for heart failure with regard to ACE inhibitors or ARB in view of very low blood pressure in fact it was discontinued in the outpatient basis.  Also presently patient undergoing cardiac catheterization and has underlying stage III chronic kidney disease hence we will wait for cardiac catheterization and then make further recommendation.  Continue low-dose of metoprolol  succinate.   For questions or updates, please contact [facility] Please consult [email or web address] for contact info under        Signed, Gordy Bergamo, MD, [facility] 05/15/2023, 10:16 AM [facility] [address] [place], [place] [address] Phone: [phone]. Fax:  [phone]  Cell: [phone]

## 2023-05-15 NOTE — ED Notes (Signed)
 ED TO INPATIENT HANDOFF REPORT  ED Nurse Name and Phone #: Delon RAMAN Name/Age/Gender Kimberly Villegas 89 y.o. female Room/Bed: 038C/038C  Code Status   Code Status: Full Code  Home/SNF/Other Home Patient oriented to: self, place, time, and situation Is this baseline? Yes   Triage Complete: Triage complete  Chief Complaint Acute on chronic diastolic (congestive) heart failure (HCC) [I50.33]  Triage Note Pt BIB [facility] for SOB. Dx with PNA in Nov. And hasn't been right since.  She is 95% RA prior to walking, 85% RA at home after walking prior to EMS arrival.  98% 2L, EMS endorses decreased lung sounds on right.  Sinus arrhythmia with hx of afib.    154/86 HR 84, Capno= 24-27   Allergies Allergies  Allergen Reactions   Vibramycin [Doxycycline] Other (See Comments)    Photosensitivity     Level of Care/Admitting Diagnosis ED Disposition     ED Disposition  Admit   Condition  --   Comment  Hospital Area: [facility] [[id number]]  Level of Care: Progressive [102]  Admit to Progressive based on following criteria: CARDIOVASCULAR & THORACIC of moderate stability with acute coronary syndrome symptoms/low risk myocardial infarction/hypertensive urgency/arrhythmias/heart failure potentially compromising stability and stable post cardiovascular intervention patients.  May admit patient to [facility] or [facility] if equivalent level of care is available:: No  Covid Evaluation: Asymptomatic - no recent exposure (last 10 days) testing not required  Diagnosis: Acute on chronic diastolic (congestive) heart failure [facility]) [[id number]]  Admitting Physician: CLEATUS DELAYNE GAILS [[id number]]  Attending Physician: CLEATUS DELAYNE GAILS [[id number]]  Certification:: I certify this patient will need inpatient services for at least 2 midnights  Expected Medical Readiness: 05/16/2023          B Medical/Surgery History Past Medical History:  Diagnosis Date   A-fib (HCC)    Anemia,  chronic disease    Arthritis    Asthma    Atypical atrial flutter (HCC)    Bilateral carotid bruits    CAD (coronary artery disease)    Cervical stenosis of spine    Cough    COVID 05/25/2021   Diastolic dysfunction    DVT (deep venous thrombosis) (HCC)    after vein stripping years ago   GERD (gastroesophageal reflux disease)    Hyperlipemia    Hypertension    Insomnia    New onset a-fib (HCC) 2018   Seasonal allergies    Syncope    Vitamin D deficiency    Past Surgical History:  Procedure Laterality Date   AMPUTATION TOE Bilateral 07/25/2016   Procedure: bilateral 2nd toe amputations;  Surgeon: Norleen Armor, MD;  Location: [facility];  Service: Orthopedics;  Laterality: Bilateral;   BASAL CELL CARCINOMA EXCISION     CARDIOVERSION N/A 09/10/2016   Procedure: CARDIOVERSION;  Surgeon: Ladona Heinz, MD;  Location: [facility] ENDOSCOPY;  Service: Cardiovascular;  Laterality: N/A;   CARDIOVERSION N/A 07/29/2017   Procedure: CARDIOVERSION;  Surgeon: Elmira Newman JINNY, MD;  Location: [facility] ENDOSCOPY;  Service: Cardiovascular;  Laterality: N/A;   CARDIOVERSION N/A 07/18/2020   Procedure: CARDIOVERSION;  Surgeon: Ladona Heinz, MD;  Location: [facility] ENDOSCOPY;  Service: Cardiovascular;  Laterality: N/A;   CARDIOVERSION N/A 09/04/2021   Procedure: CARDIOVERSION;  Surgeon: Ladona Heinz, MD;  Location: [facility] ENDOSCOPY;  Service: Cardiovascular;  Laterality: N/A;   FOOT SURGERY Left    JOINT REPLACEMENT Right    VEIN LIGATION AND STRIPPING Right      A IV Location/Drains/Wounds  Patient Lines/Drains/Airways Status     Active Line/Drains/Airways     Name Placement date Placement time Site Days   Peripheral IV 05/14/23 20 G Anterior;Proximal;Right Forearm 05/14/23  1609  Forearm  1   Peripheral IV 05/14/23 20 G Posterior;Left;Lateral Forearm 05/14/23  2015  Forearm  1            Intake/Output Last 24 hours  Intake/Output Summary (Last 24 hours) at 05/15/2023 1156 Last data filed at 05/15/2023  9356 Gross per 24 hour  Intake --  Output 1800 ml  Net -1800 ml    Labs/Imaging Results for orders placed or performed during the hospital encounter of 05/14/23 (from the past 48 hours)  Resp panel by RT-PCR (RSV, Flu A&B, Covid) Anterior Nasal Swab     Status: None   Collection Time: 05/14/23  9:26 AM   Specimen: Anterior Nasal Swab  Result Value Ref Range   SARS Coronavirus 2 by RT PCR NEGATIVE NEGATIVE   Influenza A by PCR NEGATIVE NEGATIVE   Influenza B by PCR NEGATIVE NEGATIVE    Comment: (NOTE) The Xpert Xpress SARS-CoV-2/FLU/RSV plus assay is intended as an aid in the diagnosis of influenza from Nasopharyngeal swab specimens and should not be used as a sole basis for treatment. Nasal washings and aspirates are unacceptable for Xpert Xpress SARS-CoV-2/FLU/RSV testing.  Fact Sheet for Patients: bloggercourse.com  Fact Sheet for Healthcare Providers: seriousbroker.it  This test is not yet approved or cleared by the [place]  FDA and has been authorized for detection and/or diagnosis of SARS-CoV-2 by FDA under an Emergency Use Authorization (EUA). This EUA will remain in effect (meaning this test can be used) for the duration of the COVID-19 declaration under Section 564(b)(1) of the Act, 21 U.S.C. section 360bbb-3(b)(1), unless the authorization is terminated or revoked.     Resp Syncytial Virus by PCR NEGATIVE NEGATIVE    Comment: (NOTE) Fact Sheet for Patients: bloggercourse.com  Fact Sheet for Healthcare Providers: seriousbroker.it  This test is not yet approved or cleared by the [place]  FDA and has been authorized for detection and/or diagnosis of SARS-CoV-2 by FDA under an Emergency Use Authorization (EUA). This EUA will remain in effect (meaning this test can be used) for the duration of the COVID-19 declaration under Section 564(b)(1) of the Act, 21  U.S.C. section 360bbb-3(b)(1), unless the authorization is terminated or revoked.  Performed at [facility], [address], [place], [place] [address]   CBC with Differential     Status: Abnormal   Collection Time: 05/14/23 11:03 AM  Result Value Ref Range   WBC 6.8 4.0 - 10.5 K/uL   RBC 3.09 (L) 3.87 - 5.11 MIL/uL   Hemoglobin 10.7 (L) 12.0 - 15.0 g/dL   HCT 67.4 (L) 63.9 - 53.9 %   MCV 105.2 (H) 80.0 - 100.0 fL   MCH 34.6 (H) 26.0 - 34.0 pg   MCHC 32.9 30.0 - 36.0 g/dL   RDW 84.3 (H) 88.4 - 84.4 %   Platelets 197 150 - 400 K/uL   nRBC 0.0 0.0 - 0.2 %   Neutrophils Relative % 75 %   Neutro Abs 5.1 1.7 - 7.7 K/uL   Lymphocytes Relative 15 %   Lymphs Abs 1.0 0.7 - 4.0 K/uL   Monocytes Relative 8 %   Monocytes Absolute 0.6 0.1 - 1.0 K/uL   Eosinophils Relative 1 %   Eosinophils Absolute 0.0 0.0 - 0.5 K/uL   Basophils Relative 1 %   Basophils  Absolute 0.0 0.0 - 0.1 K/uL   Immature Granulocytes 0 %   Abs Immature Granulocytes 0.02 0.00 - 0.07 K/uL    Comment: Performed at [facility], [address], [place], [place] [address]  Comprehensive metabolic panel     Status: Abnormal   Collection Time: 05/14/23 11:03 AM  Result Value Ref Range   Sodium 138 135 - 145 mmol/L   Potassium 4.0 3.5 - 5.1 mmol/L   Chloride 107 98 - 111 mmol/L   CO2 21 (L) 22 - 32 mmol/L   Glucose, Bld 95 70 - 99 mg/dL    Comment: Glucose reference range applies only to samples taken after fasting for at least 8 hours.   BUN 12 8 - 23 mg/dL   Creatinine, Ser 8.94 (H) 0.44 - 1.00 mg/dL   Calcium  8.9 8.9 - 10.3 mg/dL   Total Protein 6.2 (L) 6.5 - 8.1 g/dL   Albumin 3.4 (L) 3.5 - 5.0 g/dL   AST 34 15 - 41 U/L   ALT 21 0 - 44 U/L   Alkaline Phosphatase 70 38 - 126 U/L   Total Bilirubin 1.3 (H) 0.0 - 1.2 mg/dL   GFR, Estimated 52 (L) >60 mL/min    Comment: (NOTE) Calculated using the CKD-EPI Creatinine Equation (2021)    Anion gap 10 5 - 15    Comment: Performed at [facility]  [facility], [address], [place], [place] [address]  Troponin I (High Sensitivity)     Status: None   Collection Time: 05/14/23 11:03 AM  Result Value Ref Range   Troponin I (High Sensitivity) 12 <18 ng/L    Comment: (NOTE) Elevated high sensitivity troponin I (hsTnI) values and significant  changes across serial measurements may suggest ACS but many other  chronic and acute conditions are known to elevate hsTnI results.  Refer to the Links section for chest pain algorithms and additional  guidance. Performed at [facility], [address], [place], [place] [address]   Brain natriuretic peptide     Status: Abnormal   Collection Time: 05/14/23 11:03 AM  Result Value Ref Range   B Natriuretic Peptide 742.0 (H) 0.0 - 100.0 pg/mL    Comment: Performed at [facility], [address], [place], [place] [address]  APTT     Status: Abnormal   Collection Time: 05/14/23  8:25 PM  Result Value Ref Range   aPTT 38 (H) 24 - 36 seconds    Comment:        IF BASELINE aPTT IS ELEVATED, SUGGEST PATIENT RISK ASSESSMENT BE USED TO DETERMINE APPROPRIATE ANTICOAGULANT THERAPY. Performed at [facility], [address] [place], [place] [address]   Heparin  level (unfractionated)     Status: Abnormal   Collection Time: 05/14/23  8:25 PM  Result Value Ref Range   Heparin  Unfractionated >1.10 (H) 0.30 - 0.70 IU/mL    Comment: (NOTE) The clinical reportable range upper limit is being lowered to >1.10 to align with the FDA approved guidance for the current laboratory assay.  If heparin  results are below expected values, and patient dosage has  been confirmed, suggest follow up testing of antithrombin III levels. Performed at [facility], [address] [place], [place] [address]   APTT     Status: Abnormal   Collection Time: 05/15/23  4:55 AM  Result Value Ref Range   aPTT 100 (H) 24 - 36 seconds    Comment:  IF BASELINE aPTT IS ELEVATED, SUGGEST PATIENT RISK  ASSESSMENT BE USED TO DETERMINE APPROPRIATE ANTICOAGULANT THERAPY. Performed at [facility], [address], [place], [place] [address]   Heparin  level (unfractionated)     Status: Abnormal   Collection Time: 05/15/23  4:55 AM  Result Value Ref Range   Heparin  Unfractionated >1.10 (H) 0.30 - 0.70 IU/mL    Comment: (NOTE) The clinical reportable range upper limit is being lowered to >1.10 to align with the FDA approved guidance for the current laboratory assay.  If heparin  results are below expected values, and patient dosage has  been confirmed, suggest follow up testing of antithrombin III levels. Performed at [facility], [address] [place], [place] [address]   Basic metabolic panel     Status: Abnormal   Collection Time: 05/15/23  9:49 AM  Result Value Ref Range   Sodium 139 135 - 145 mmol/L   Potassium 3.4 (L) 3.5 - 5.1 mmol/L   Chloride 104 98 - 111 mmol/L   CO2 25 22 - 32 mmol/L   Glucose, Bld 88 70 - 99 mg/dL    Comment: Glucose reference range applies only to samples taken after fasting for at least 8 hours.   BUN 13 8 - 23 mg/dL   Creatinine, Ser 8.87 (H) 0.44 - 1.00 mg/dL   Calcium  8.6 (L) 8.9 - 10.3 mg/dL   GFR, Estimated 48 (L) >60 mL/min    Comment: (NOTE) Calculated using the CKD-EPI Creatinine Equation (2021)    Anion gap 10 5 - 15    Comment: Performed at [facility], [address], [place], [place] [address]  Lipid panel     Status: None   Collection Time: 05/15/23  9:49 AM  Result Value Ref Range   Cholesterol 125 0 - 200 mg/dL   Triglycerides 44 <849 mg/dL   HDL 66 >59 mg/dL   Total CHOL/HDL Ratio 1.9 RATIO   VLDL 9 0 - 40 mg/dL   LDL Cholesterol 50 0 - 99 mg/dL    Comment:        Total Cholesterol/HDL:CHD Risk Coronary Heart Disease Risk Table                     Men   Women  1/2 Average Risk   3.4   3.3  Average Risk       5.0   4.4  2 X Average Risk   9.6   7.1  3 X Average Risk  23.4   11.0        Use the  calculated Patient Ratio above and the CHD Risk Table to determine the patient's CHD Risk.        ATP III CLASSIFICATION (LDL):  <100     mg/dL   Optimal  899-870  mg/dL   Near or Above                    Optimal  130-159  mg/dL   Borderline  839-810  mg/dL   High  >809     mg/dL   Very High Performed at [facility], [address], [place], [place] [address]    DG Chest 2 View Result Date: 05/14/2023 CLINICAL DATA:  Shortness of breath. EXAM: CHEST - 2 VIEW COMPARISON:  03/17/2023. FINDINGS: Low lung volume. Mild diffuse pulmonary vascular congestion with bilateral layering pleural effusions, favoring congestive heart failure/pulmonary edema. There are probable associated compressive  atelectatic changes in the bilateral lung bases. Bilateral lung fields are otherwise clear. No pneumothorax. Stable cardio-mediastinal silhouette. No acute osseous abnormalities. The soft tissues are within normal limits. IMPRESSION: Findings favor congestive heart failure/pulmonary edema. Electronically Signed   By: Ree Molt M.D.   On: 05/14/2023 10:04    Pending Labs Unresulted Labs (From admission, onward)     Start     Ordered   05/16/23 0500  APTT  Daily,   R      05/14/23 1939   05/16/23 0500  Heparin  level (unfractionated)  Daily,   R      05/14/23 1939   05/15/23 1400  Heparin  level (unfractionated)  Once-Timed,   TIMED        05/15/23 0616   05/15/23 1400  APTT  Once-Timed,   TIMED        05/15/23 0616            Vitals/Pain Today's Vitals   05/15/23 0633 05/15/23 0900 05/15/23 0955 05/15/23 1015  BP:  (!) 145/53    Pulse:  66    Resp:  15    Temp:   97.9 F (36.6 C) 97.8 F (36.6 C)  TempSrc:   Oral   SpO2:  95%    Weight:      Height:      PainSc: 0-No pain       Isolation Precautions No active isolations  Medications Medications  potassium chloride  (KLOR-CON  M) CR tablet 10 mEq (10 mEq Oral Given 05/15/23 0947)  metoprolol  succinate (TOPROL -XL) 24 hr  tablet 25 mg (25 mg Oral Given 05/15/23 0946)  atorvastatin  (LIPITOR) tablet 40 mg (40 mg Oral Given 05/15/23 0946)  heparin  ADULT infusion 100 units/mL (25000 units/250mL) (900 Units/hr Intravenous Rate/Dose Verify 05/15/23 0649)  dronedarone  (MULTAQ ) tablet 400 mg (400 mg Oral Given 05/15/23 0946)  mirabegron  ER (MYRBETRIQ ) tablet 50 mg (has no administration in time range)  acetaminophen  (TYLENOL ) tablet 650 mg (has no administration in time range)    Or  acetaminophen  (TYLENOL ) suppository 650 mg (has no administration in time range)  HYDROcodone -acetaminophen  (NORCO/VICODIN) 5-325 MG per tablet 1-2 tablet (has no administration in time range)  albuterol  (PROVENTIL ) (2.5 MG/3ML) 0.083% nebulizer solution 2.5 mg (has no administration in time range)  furosemide  (LASIX ) injection 40 mg (40 mg Intravenous Given 05/15/23 0946)  0.9 %  sodium chloride  infusion ( Intravenous New Bag/Given 05/15/23 0612)  furosemide  (LASIX ) injection 40 mg (40 mg Intravenous Given 05/14/23 1618)  aspirin  chewable tablet 81 mg (81 mg Oral Given 05/15/23 0610)    Mobility walks     Focused Assessments Cardiac Assessment Handoff:  Cardiac Rhythm: Normal sinus rhythm No results found for: CKTOTAL, CKMB, CKMBINDEX, TROPONINI No results found for: DDIMER Does the Patient currently have chest pain? No    R Recommendations: See Admitting Provider Note  Report given to:   Additional Notes:

## 2023-05-15 NOTE — Progress Notes (Signed)
   05/15/23 2000  Spiritual Encounters  Type of Visit Initial  Care provided to: Patient  Conversation partners present during encounter Nurse  Referral source Code page  Reason for visit Urgent spiritual support  OnCall Visit Yes   Chaplain responded to rapid response. There was no family present at bedside. Chaplain provided emotional and spiritual support to patient and staff. No follow-up needed at this time.

## 2023-05-16 ENCOUNTER — Other Ambulatory Visit (HOSPITAL_COMMUNITY): Payer: Self-pay

## 2023-05-16 ENCOUNTER — Encounter (HOSPITAL_COMMUNITY): Payer: Self-pay | Admitting: Cardiovascular Disease

## 2023-05-16 DIAGNOSIS — R0609 Other forms of dyspnea: Secondary | ICD-10-CM | POA: Diagnosis not present

## 2023-05-16 DIAGNOSIS — N1831 Chronic kidney disease, stage 3a: Secondary | ICD-10-CM | POA: Diagnosis not present

## 2023-05-16 DIAGNOSIS — I480 Paroxysmal atrial fibrillation: Secondary | ICD-10-CM | POA: Diagnosis not present

## 2023-05-16 DIAGNOSIS — I5033 Acute on chronic diastolic (congestive) heart failure: Secondary | ICD-10-CM | POA: Diagnosis not present

## 2023-05-16 LAB — BASIC METABOLIC PANEL
Anion gap: 11 (ref 5–15)
BUN: 20 mg/dL (ref 8–23)
CO2: 27 mmol/L (ref 22–32)
Calcium: 8.6 mg/dL — ABNORMAL LOW (ref 8.9–10.3)
Chloride: 99 mmol/L (ref 98–111)
Creatinine, Ser: 1.66 mg/dL — ABNORMAL HIGH (ref 0.44–1.00)
GFR, Estimated: 30 mL/min — ABNORMAL LOW (ref >60)
Glucose, Bld: 138 mg/dL — ABNORMAL HIGH (ref 70–99)
Potassium: 3.7 mmol/L (ref 3.5–5.1)
Sodium: 137 mmol/L (ref 135–145)

## 2023-05-16 LAB — CBC
HCT: 33.4 % — ABNORMAL LOW (ref 36.0–46.0)
Hemoglobin: 11.2 g/dL — ABNORMAL LOW (ref 12.0–15.0)
MCH: 34.3 pg — ABNORMAL HIGH (ref 26.0–34.0)
MCHC: 33.5 g/dL (ref 30.0–36.0)
MCV: 102.1 fL — ABNORMAL HIGH (ref 80.0–100.0)
Platelets: 201 10*3/uL (ref 150–400)
RBC: 3.27 MIL/uL — ABNORMAL LOW (ref 3.87–5.11)
RDW: 15.4 % (ref 11.5–15.5)
WBC: 9 10*3/uL (ref 4.0–10.5)
nRBC: 0 % (ref 0.0–0.2)

## 2023-05-16 LAB — MAGNESIUM: Magnesium: 1.9 mg/dL (ref 1.7–2.4)

## 2023-05-16 MED ORDER — FUROSEMIDE 20 MG PO TABS
20.0000 mg | ORAL_TABLET | Freq: Every day | ORAL | Status: DC
  Administered 2023-05-17: 20 mg via ORAL
  Filled 2023-05-16: qty 1

## 2023-05-16 MED ORDER — POTASSIUM CHLORIDE CRYS ER 10 MEQ PO TBCR
20.0000 meq | EXTENDED_RELEASE_TABLET | Freq: Every day | ORAL | 0 refills | Status: AC | PRN
  Filled 2023-05-16: qty 60, 30d supply, fill #0

## 2023-05-16 MED ORDER — POTASSIUM CHLORIDE ER 10 MEQ PO TBCR
20.0000 meq | EXTENDED_RELEASE_TABLET | Freq: Once | ORAL | Status: AC
  Administered 2023-05-16: 20 meq via ORAL
  Filled 2023-05-16: qty 2

## 2023-05-16 MED ORDER — AMIODARONE HCL 200 MG PO TABS
200.0000 mg | ORAL_TABLET | Freq: Every day | ORAL | Status: DC
  Administered 2023-05-16 – 2023-05-17 (×2): 200 mg via ORAL
  Filled 2023-05-16 (×2): qty 1

## 2023-05-16 MED ORDER — MIDODRINE HCL 5 MG PO TABS
5.0000 mg | ORAL_TABLET | Freq: Three times a day (TID) | ORAL | Status: DC
  Administered 2023-05-16 – 2023-05-17 (×3): 5 mg via ORAL
  Filled 2023-05-16 (×3): qty 1

## 2023-05-16 MED ORDER — SODIUM CHLORIDE 0.9 % IV BOLUS
500.0000 mL | Freq: Once | INTRAVENOUS | Status: AC
  Administered 2023-05-16: 500 mL via INTRAVENOUS

## 2023-05-16 MED ORDER — AMIODARONE HCL 200 MG PO TABS
200.0000 mg | ORAL_TABLET | Freq: Every day | ORAL | 1 refills | Status: DC
  Filled 2023-05-16: qty 30, 30d supply, fill #0

## 2023-05-16 MED ORDER — FUROSEMIDE 20 MG PO TABS
20.0000 mg | ORAL_TABLET | Freq: Every day | ORAL | 0 refills | Status: AC | PRN
  Filled 2023-05-16: qty 30, 30d supply, fill #0

## 2023-05-16 MED ORDER — MIDODRINE HCL 5 MG PO TABS
5.0000 mg | ORAL_TABLET | Freq: Three times a day (TID) | ORAL | 1 refills | Status: DC
  Filled 2023-05-16: qty 90, 30d supply, fill #0

## 2023-05-16 MED ORDER — POLYETHYLENE GLYCOL 3350 17 G PO PACK
17.0000 g | PACK | Freq: Every day | ORAL | Status: DC | PRN
  Administered 2023-05-16 – 2023-05-17 (×2): 17 g via ORAL
  Filled 2023-05-16 (×2): qty 1

## 2023-05-16 NOTE — Progress Notes (Signed)
 PROGRESS NOTE    Kimberly Villegas  FMW:[id number] DOB: 1935/01/31 DOA: 05/14/2023 PCP: Janey Santos, MD    Brief Narrative:   Kimberly Villegas is a 89 y.o. female with past medical history significant for HTN, CKD 3A, A-fib/flutter with multiple prior cardioversions most recently 09/2021, on dronedarone  and Eliquis , HFrEF with improved EF (40%-->55%,05/05/2023), CAD, asthma and gout, recently hospitalized from 11/11 to 11/13 with acute respiratory failure secondary to rhinovirus pneumonia weaned off of oxygen prior to discharge who presented to [facility] ED on 1/8 from home via EMS with shortness of breath and low O2 sats reading at home, 95% at rest 85% after walking.  She endorses ongoing fatigue.  She denied chest pain, lower extremity pain or swelling.  Denies cough.  During her recent hospitalization she was found to have bilateral pleural effusions for which she was referred to cardiology for follow-up.    In the ED, temperature 98.1 F, HR 73, RR 24, BP 179/69, SpO2 89% on room air.  WBC 6.8, hemoglobin 10.7, plate count 802.  Sodium 138, potassium 4.0, chloride 107, CO2 21, glucose 95, BUN 12, creatinine 1.05.  AST 34, ALT 21, total bilirubin 1.3.  BNP 742.0.  High sensitive troponin 12.  COVID/RSV/influenza PCR negative.  Chest x-ray with pulmonary edema.  Patient was given IV Lasix  in the ED.  Cardiology was consulted.  Heparin  drip initiated with plans for cardiac catheterization.  TRH consulted for admission for further evaluation and management.   Assessment & Plan:   Acute respiratory failure with hypoxia Acute on chronic diastolic congestive heart failure Patient presenting to ED with progressive shortness of breath was noted to be hypoxic at home on ambulation with SpO2 85%.  COVID/RSV/influenza PCR negative.  BNP elevated 742.0.  Chest x-ray remarkable for pulmonary edema. -- Cardiology following, appreciate assistance -- IV Lasix  transitioned to 20 mg p.o. daily starting tomorrow  (1/10) by cardiology -- Strict I's and O's and daily weights -- Continue supplemental oxygen to maintain SpO2 greater than 92%, wean as able -- BMP daily  Orthostatic hypotension Overnight while ambulating to the bathroom patient became dizzy with syncopal episode and was assisted back to the bed.  Orthostatic vital signs positive.  Etiology likely secondary to overdiuresis.  Was given IV fluid bolus. -- Hold further IV diuresis at this time (cardiology placed order for Lasix  20 mg p.o. starting tomorrow) -- Repeat IV fluid bolus this morning -- Repeat orthostatic vital signs -- Discontinue Myrbetriq  as possible contributing factor to her underlying dizziness/orthostasis, recently started outpatient -- Fall precautions  CAD Patient with history of anterior wall motion abnormality on echo 05/05/2023.  Patient complaining of fatigue, dyspnea on exertion and mild chest pressure.  Troponin within normal limits, no ischemic changes noted on EKG.  Seen by cardiology and started on heparin  infusion. -- Cardiology following, appreciate assistance -- Left heart catheterization 1/9 with mild nonobstructive CAD mild 30% proximal RCA stenosis, 40-50% LAD stenosis just after the first diagonal. -- Atorvastatin  40 mg p.o. daily -- Eliquis   A-fib/flutter on anticoagulation -- Metoprolol  succinate; discontinued 1/10 due to bradycardia -- Dronedarone  400 mg PO BID -- Eliquis   Hyperlipidemia -- Atorvastatin  40 mg p.o. daily  Acute Renal failure on CKD stage IIIa -- Cr 1.05>1.12>1.66; likely to overdiuresis -- Repeat BMP in a.m.  Asthma -- Albuterol  every 2 hours as needed wheezing/shortness of breath   DVT prophylaxis: Heparin  drip apixaban  (ELIQUIS ) tablet 5 mg    Code Status: Full Code Family Communication: No family present  at bedside  Disposition Plan:  Level of care: Progressive Status is: Inpatient Remains inpatient appropriate because: IV fluid bolus for orthostasis/orthostatic  hypotension, potential discharge home tomorrow if cardiology signs off.    Consultants:  Cardiology  Procedures:  Left heart catheterization  Antimicrobials:  None   Subjective: Patient examined at bedside, resting comfortably.  Lying in bed.  Daughter present at bedside.  Overnight had syncopal episode secondary to orthostasis while ambulating to the bathroom.  Likely secondary to overdiuresis, IV Lasix  now on hold.  Received IV fluid bolus.  Also metoprolol  discontinued due to bradycardia. No other specific questions, concerns or complaints at this time.  Denies headache, no current dizziness, no chest pain, no palpitation, no abdominal pain, no fever/chills/night sweats, no nausea/vomiting/diarrhea, no focal weakness, no fatigue, no paresthesia.  No other acute events overnight per nursing staff.  Objective: Vitals:   05/16/23 0824 05/16/23 0843 05/16/23 1035 05/16/23 1113  BP: (!) 96/47  116/67 (!) 137/53  Pulse: 83   80  Resp: 20 16 16 19   Temp: 98.3 F (36.8 C)   99.2 F (37.3 C)  TempSrc: Oral   Oral  SpO2: 98%     Weight:      Height:        Intake/Output Summary (Last 24 hours) at 05/16/2023 1134 Last data filed at 05/16/2023 0108 Gross per 24 hour  Intake --  Output 400 ml  Net -400 ml   Filed Weights   05/15/23 0444 05/15/23 1415 05/16/23 0500  Weight: 63.4 kg 63.4 kg 57.9 kg    Examination:  Physical Exam: GEN: NAD, alert and oriented x 3, elderly in appearance HEENT: NCAT, PERRL, EOMI, sclera clear, MMM PULM: CTAB w/o wheezes/crackles, normal respiratory effort, on room air with SpO2 97% at rest CV: Bradycardic, regular rhythm w/o M/G/R GI: abd soft, NTND, NABS, no R/G/M MSK: no peripheral edema, muscle strength globally intact 5/5 bilateral upper/lower extremities NEURO: CN II-XII intact, no focal deficits, sensation to light touch intact PSYCH: normal mood/affect Integumentary: dry/intact, no rashes or wounds    Data Reviewed: I have personally  reviewed following labs and imaging studies  CBC: Recent Labs  Lab 05/14/23 1103 05/15/23 1333 05/15/23 1334 05/16/23 0249  WBC 6.8  --   --  9.0  NEUTROABS 5.1  --   --   --   HGB 10.7* 12.2 11.9* 11.2*  HCT 32.5* 36.0 35.0* 33.4*  MCV 105.2*  --   --  102.1*  PLT 197  --   --  201   Basic Metabolic Panel: Recent Labs  Lab 05/14/23 1103 05/15/23 0949 05/15/23 1333 05/15/23 1334 05/16/23 0249  NA 138 139 139 139 137  K 4.0 3.4* 3.3* 3.3* 3.7  CL 107 104  --   --  99  CO2 21* 25  --   --  27  GLUCOSE 95 88  --   --  138*  BUN 12 13  --   --  20  CREATININE 1.05* 1.12*  --   --  1.66*  CALCIUM  8.9 8.6*  --   --  8.6*  MG  --   --   --   --  1.9   GFR: Estimated Creatinine Clearance: 21.9 mL/min (A) (by C-G formula based on SCr of 1.66 mg/dL (H)). Liver Function Tests: Recent Labs  Lab 05/14/23 1103  AST 34  ALT 21  ALKPHOS 70  BILITOT 1.3*  PROT 6.2*  ALBUMIN 3.4*   No results for  input(s): LIPASE, AMYLASE in the last 168 hours. No results for input(s): AMMONIA in the last 168 hours. Coagulation Profile: No results for input(s): INR, PROTIME in the last 168 hours. Cardiac Enzymes: No results for input(s): CKTOTAL, CKMB, CKMBINDEX, TROPONINI in the last 168 hours. BNP (last 3 results) No results for input(s): PROBNP in the last 8760 hours. HbA1C: No results for input(s): HGBA1C in the last 72 hours. CBG: No results for input(s): GLUCAP in the last 168 hours. Lipid Profile: Recent Labs    05/15/23 0949  CHOL 125  HDL 66  LDLCALC 50  TRIG 44  CHOLHDL 1.9   Thyroid  Function Tests: No results for input(s): TSH, T4TOTAL, FREET4, T3FREE, THYROIDAB in the last 72 hours. Anemia Panel: No results for input(s): VITAMINB12, FOLATE, FERRITIN, TIBC, IRON, RETICCTPCT in the last 72 hours. Sepsis Labs: No results for input(s): PROCALCITON, LATICACIDVEN in the last 168 hours.  Recent Results (from the past 240  hours)  Resp panel by RT-PCR (RSV, Flu A&B, Covid) Anterior Nasal Swab     Status: None   Collection Time: 05/14/23  9:26 AM   Specimen: Anterior Nasal Swab  Result Value Ref Range Status   SARS Coronavirus 2 by RT PCR NEGATIVE NEGATIVE Final   Influenza A by PCR NEGATIVE NEGATIVE Final   Influenza B by PCR NEGATIVE NEGATIVE Final    Comment: (NOTE) The Xpert Xpress SARS-CoV-2/FLU/RSV plus assay is intended as an aid in the diagnosis of influenza from Nasopharyngeal swab specimens and should not be used as a sole basis for treatment. Nasal washings and aspirates are unacceptable for Xpert Xpress SARS-CoV-2/FLU/RSV testing.  Fact Sheet for Patients: bloggercourse.com  Fact Sheet for Healthcare Providers: seriousbroker.it  This test is not yet approved or cleared by the [place]  FDA and has been authorized for detection and/or diagnosis of SARS-CoV-2 by FDA under an Emergency Use Authorization (EUA). This EUA will remain in effect (meaning this test can be used) for the duration of the COVID-19 declaration under Section 564(b)(1) of the Act, 21 U.S.C. section 360bbb-3(b)(1), unless the authorization is terminated or revoked.     Resp Syncytial Virus by PCR NEGATIVE NEGATIVE Final    Comment: (NOTE) Fact Sheet for Patients: bloggercourse.com  Fact Sheet for Healthcare Providers: seriousbroker.it  This test is not yet approved or cleared by the [place]  FDA and has been authorized for detection and/or diagnosis of SARS-CoV-2 by FDA under an Emergency Use Authorization (EUA). This EUA will remain in effect (meaning this test can be used) for the duration of the COVID-19 declaration under Section 564(b)(1) of the Act, 21 U.S.C. section 360bbb-3(b)(1), unless the authorization is terminated or revoked.  Performed at [facility], [address] [place],  [place] [address]          Radiology Studies: CARDIAC CATHETERIZATION Result Date: 05/15/2023 1.  Mild nonobstructive CAD with patency of the left main and left circumflex with no stenoses, mild 30% proximal RCA stenosis, and 40 to 50% LAD stenosis just after the first diagonal 2.  Normal LVEDP of 7 mmHg 3.  Normal wedge pressure of 9 mmHg, mean PA pressure 22 mmHg, transpulmonary gradient 13 mmHg, PVR 2.1 Wood units Recommendations: Medical therapy, patient appears well compensated from a heart failure standpoint.  Okay to resume apixaban  tomorrow morning.        Scheduled Meds:  amiodarone   200 mg Oral Daily   apixaban   5 mg Oral BID PC   atorvastatin   40 mg Oral Daily   [  START ON 05/17/2023] furosemide   20 mg Oral Daily   mirabegron  ER  50 mg Oral QHS   potassium chloride   10 mEq Oral Daily   potassium chloride   20 mEq Oral Once   sodium chloride  flush  3 mL Intravenous Q12H   Continuous Infusions:  sodium chloride        LOS: 2 days    Time spent: 56 minutes spent on chart review, discussion with nursing staff, consultants, updating family and interview/physical exam; more than 50% of that time was spent in counseling and/or coordination of care.    Camellia PARAS Zoejane Gaulin, DO [facility] Available via Epic secure chat 7am-7pm After these hours, please refer to coverage provider listed on [email or web address] 05/16/2023, 11:34 AM

## 2023-05-16 NOTE — Progress Notes (Signed)
 Patient Name: Kimberly Villegas Date of Encounter: 05/16/2023 [facility] Cardiologist: Gordy Bergamo, MD  05/14/2023 .admit Length of stay: 2  Interval Summary  .    Kimberly Villegas is a 89 y.o. female with a hx of PAF now appears to have persistent atrial fibrillation, mild aortic regurgitation, hypercholesterolemia, who is being seen 05/14/2023 for the evaluation of dyspnea, hypoxemia past medical history significant for mild to moderate aortic and mitral regurgitation, asymptomatic left subclavian artery stenosis, previously hypertensive but since summer 2024 blood pressure has been soft, hypercholesterolemia, recovered cardiomyopathy secondary to persistent atrial fibrillation now maintaining sinus rhythm on Multaq .   Patient admitted on 03/17/2023 and discharged 2 days later with rhinovirus pneumonia, I had seen her on 03/26/2023 where she was complaining of recurrent episodes of syncope for which she underwent extended EKG monitoring and echocardiogram.  She had abnormal echocardiogram revealing anterior wall motion abnormality and was recommended stress testing, extended EKG monitoring did not reveal any significant arrhythmias.  She is admitted with hypoxemia, worsening dyspnea and elevated BNP and chest x-ray findings of pulmonary edema on 05/14/2023.   Physical Exam    Vitals:   05/16/23 0824 05/16/23 0843 05/16/23 1035 05/16/23 1113  BP: (!) 96/47  116/67 (!) 137/53  Pulse: 83   80  Resp: 20 16 16 19   Temp: 98.3 F (36.8 C)   99.2 F (37.3 C)  TempSrc: Oral   Oral  SpO2: 98%     Weight:      Height:       Orthostatic VS for the past 72 hrs (Last 3 readings):  BP Location  05/16/23 1113 Right Arm  05/16/23 1035 Right Arm  05/16/23 0824 Right Arm     Physical Exam Neck:     Vascular: No JVD.  Cardiovascular:     Rate and Rhythm: Normal rate and regular rhythm.     Pulses: Intact distal pulses.     Heart sounds: S1 normal and S2 normal. Murmur heard.      Holosystolic murmur is present with a grade of 2/6 at the lower left sternal border.     No gallop.  Pulmonary:     Effort: Pulmonary effort is normal.     Breath sounds: Normal breath sounds.  Abdominal:     General: Bowel sounds are normal.     Palpations: Abdomen is soft.  Musculoskeletal:     Right lower leg: No edema.     Left lower leg: No edema.        05/16/2023    5:00 AM 05/15/2023    2:15 PM 05/15/2023    4:44 AM  Last 3 Weights  Weight (lbs) 127 lb 10.3 oz 139 lb 12.8 oz 139 lb 12.8 oz  Weight (kg) 57.9 kg 63.413 kg 63.413 kg      Labs   Lab Results  Component Value Date   NA 137 05/16/2023   K 3.7 05/16/2023   CO2 27 05/16/2023   GLUCOSE 138 (H) 05/16/2023   BUN 20 05/16/2023   CREATININE 1.66 (H) 05/16/2023   CALCIUM  8.6 (L) 05/16/2023   EGFR 32 (L) 08/29/2021   GFRNONAA 30 (L) 05/16/2023       Latest Ref Rng & Units 05/16/2023    2:49 AM 05/15/2023    1:34 PM 05/15/2023    1:33 PM  BMP  Glucose 70 - 99 mg/dL 861     BUN 8 - 23 mg/dL 20     Creatinine 9.55 -  1.00 mg/dL 8.33     Sodium 864 - 854 mmol/L 137  139  139   Potassium 3.5 - 5.1 mmol/L 3.7  3.3  3.3   Chloride 98 - 111 mmol/L 99     CO2 22 - 32 mmol/L 27     Calcium  8.9 - 10.3 mg/dL 8.6          Latest Ref Rng & Units 05/16/2023    2:49 AM 05/15/2023    1:34 PM 05/15/2023    1:33 PM  CBC  WBC 4.0 - 10.5 K/uL 9.0     Hemoglobin 12.0 - 15.0 g/dL 88.7  88.0  87.7   Hematocrit 36.0 - 46.0 % 33.4  35.0  36.0   Platelets 150 - 400 K/uL 201       Lab Results  Component Value Date   CHOL 125 05/15/2023   HDL 66 05/15/2023   LDLCALC 50 05/15/2023   TRIG 44 05/15/2023   CHOLHDL 1.9 05/15/2023    Cardiac Panel (last 3 results) Recent Labs    05/14/23 1103  TROPONINIHS 12    BNP (last 3 results) Recent Labs    03/18/23 0345 05/14/23 1103  BNP 578.5* 742.0*   Intake/Output Summary (Last 24 hours) at 05/16/2023 1510 Last data filed at 05/16/2023 1100 Gross per 24 hour  Intake --   Output 700 ml  Net -700 ml    Net IO Since Admission: -2,500 mL [05/16/23 1510]   Tele/EKG/Cardiac studies   Radiology/Studies:  DG Chest 2 View Result Date: 05/14/2023 CLINICAL DATA:  Shortness of breath. EXAM: CHEST - 2 VIEW COMPARISON:  03/17/2023. FINDINGS: Low lung volume. Mild diffuse pulmonary vascular congestion with bilateral layering pleural effusions, favoring congestive heart failure/pulmonary edema. There are probable associated compressive atelectatic changes in the bilateral lung bases. Bilateral lung fields are otherwise clear. No pneumothorax. Stable cardio-mediastinal silhouette. No acute osseous abnormalities. The soft tissues are within normal limits. IMPRESSION: Findings favor congestive heart failure/pulmonary edema. Electronically Signed   By: Ree Molt M.D.   On: 05/14/2023 10:04    EKG:  The EKG was personally reviewed and demonstrates:  EKG 05/14/2023: Normal sinus rhythm at rate of 85 bpm, normal axis.  Poor R progression, cannot exclude anteroseptal infarct old.  PACs.  Nonspecific sagging ST segment depression lateral leads.  Prolonged QT.  Compared to 03/18/2023, sagging ST depression in the lateral leads is new.   Telemetry:  Telemetry was personally reviewed and demonstrates: Normal sinus rhythm with frequent PACs. No change from yesterday   Relevant CV Studies:   Echocardiogram 05/05/2023 :   1. Left ventricular ejection fraction, by estimation, is 50 to 55%. The left ventricle has low normal function. The left ventricle demonstrates regional wall motion abnormalities (The entire anterior wall is hypokinetic, otherwise normal). Left ventricular diastolic  parameters are consistent with Grade II diastolic dysfunction (pseudonormalization). Elevated left ventricular end-diastolic pressure.  2. Right ventricular systolic function is normal. The right ventricular size is normal. There is moderately elevated pulmonary artery systolic pressure.  3. Left atrial size  was moderately dilated.  4. Right atrial size was severely dilated.  5. The mitral valve is normal in structure. Moderate mitral valve regurgitation. No evidence of mitral stenosis.  6. The aortic valve is tricuspid. Aortic valve regurgitation is mild. No aortic stenosis is present.  7. The inferior vena cava is dilated in size with >50% respiratory variability, suggesting right atrial pressure of 8 mmHg.   Event monitor 30 days starting 03/26/2023.  Minimum heart rate 44 bpm at 1649 hrs. and maximum heart rate 119 bpm with average heartbeat of 75 bpm. There is occasional episodes of atrial fibrillation with heart rate ranging from 54 bpm to 114 bpm with total AF burden of 4%.  Longest AF was 1 hour and 16 minutes. There was no high degree AV block.  Longest pause was 2.3 seconds x 1. Occasional brief AT with AV block noted.  Occasional PACs, burden 4% Occasional PVCs burden <1%. Auto triggered events reveal brief Wenckebach phenomena, brief atrial tachycardia with variable AV block, 2.3-second pause at 4:44 AM, PACs.  Symptomatic event (2) revealed Normal sinus rhythm.          Right + Left Heart Catheterization 05/15/2023:  1.  Mild nonobstructive CAD with patency of the left main and left circumflex with no stenoses, mild 30% proximal RCA stenosis, and 40 to 50% LAD stenosis just after the first diagonal 2.  Normal LVEDP of 7 mmHg 3.  Normal wedge pressure of 9 mmHg, mean PA pressure 22 mmHg, transpulmonary gradient 13 mmHg, PVR 2.1 Wood units    Recommendations: Medical therapy, patient appears well compensated from a heart failure standpoint.  Current Meds:     Current Facility-Administered Medications:    acetaminophen  (TYLENOL ) tablet 650 mg, 650 mg, Oral, Q6H PRN **OR** acetaminophen  (TYLENOL ) suppository 650 mg, 650 mg, Rectal, Q6H PRN, Cooper, Michael, MD   albuterol  (PROVENTIL ) (2.5 MG/3ML) 0.083% nebulizer solution 2.5 mg, 2.5 mg, Nebulization, Q2H PRN, Wonda Sharper,  MD   amiodarone  (PACERONE ) tablet 200 mg, 200 mg, Oral, Daily, Armour Villanueva, MD, 200 mg at 05/16/23 1038   apixaban  (ELIQUIS ) tablet 5 mg, 5 mg, Oral, BID PC, Cooper, Michael, MD, 5 mg at 05/16/23 9157   atorvastatin  (LIPITOR) tablet 40 mg, 40 mg, Oral, Daily, Cooper, Michael, MD, 40 mg at 05/16/23 0842   [START ON 05/17/2023] furosemide  (LASIX ) tablet 20 mg, 20 mg, Oral, Daily, Shylee Durrett, MD   HYDROcodone -acetaminophen  (NORCO/VICODIN) 5-325 MG per tablet 1-2 tablet, 1-2 tablet, Oral, Q4H PRN, Wonda Sharper, MD   midodrine  (PROAMATINE ) tablet 5 mg, 5 mg, Oral, TID WC, Maryna Yeagle, MD   polyethylene glycol (MIRALAX  / GLYCOLAX ) packet 17 g, 17 g, Oral, Daily PRN, [place], Camellia PARAS, DO, 17 g at 05/16/23 1038   potassium chloride  (KLOR-CON  M) CR tablet 10 mEq, 10 mEq, Oral, Daily, Wonda Sharper, MD, 10 mEq at 05/16/23 9156   prochlorperazine  (COMPAZINE ) injection 5 mg, 5 mg, Intravenous, Q6H PRN, Hall, Carole N, DO   sodium chloride  flush (NS) 0.9 % injection 3 mL, 3 mL, Intravenous, Q12H, Wonda Sharper, MD, 3 mL at 05/16/23 0843   sodium chloride  flush (NS) 0.9 % injection 3 mL, 3 mL, Intravenous, PRN, Wonda Sharper, MD  Assessment & Plan .     Acute on chronic diastolic heart failure Dizziness secondary to orthostatic hypotension Paroxysmal atrial fibrillation Chronic stage IIIa kidney disease  Recommendations: She has diuresed close to 2.5 L and presently dyspnea is completely resolved.  She has developed mild renal insufficiency, we will follow-up on BMP tomorrow and if stable can be discharged home.  She is not on any guideline directed medical therapy in view of soft blood pressure and patient has not been able to tolerate any medications, she was also on metoprolol  upon presentation which I have discontinued due to low blood pressure, blood pressure was as low as 90 mmHg and she had near syncopal spell when she got up to go to the bathroom yesterday.  I will change her furosemide  to be  taken on a as needed basis if weight increases by 3 pounds in 3 days along with potassium pill.  For now continue amiodarone  for paroxysmal atrial fibrillation which is new, I have discontinued Multaq  in view of congestive heart failure.  I will follow her up in the office in 2 to 3 weeks.   Please check her serum creatinine, if >1.5, reduce the dose of Eliquis  to 2.5 mg twice daily prior to discharge.  I have sent discharge medications to [facility] pharmacy  For questions or updates, please contact [facility] Please consult [email or web address] for contact info under        Signed, Gordy Bergamo, MD, [facility] 05/16/2023, 3:10 PM [facility] [address] [place] [place], [place] [address] Phone: [phone]. Fax:  [phone]  Cell: [phone]

## 2023-05-16 NOTE — Plan of Care (Signed)
  Problem: Education: Goal: Ability to demonstrate management of disease process will improve Outcome: Progressing Goal: Ability to verbalize understanding of medication therapies will improve Outcome: Progressing Goal: Individualized Educational Video(s) Outcome: Progressing   Problem: Activity: Goal: Capacity to carry out activities will improve Outcome: Progressing   Problem: Cardiac: Goal: Ability to achieve and maintain adequate cardiopulmonary perfusion will improve Outcome: Progressing   Problem: Education: Goal: Knowledge of General Education information will improve Description: Including pain rating scale, medication(s)/side effects and non-pharmacologic comfort measures Outcome: Progressing   Problem: Health Behavior/Discharge Planning: Goal: Ability to manage health-related needs will improve Outcome: Progressing   Problem: Clinical Measurements: Goal: Ability to maintain clinical measurements within normal limits will improve Outcome: Progressing Goal: Will remain free from infection Outcome: Progressing Goal: Diagnostic test results will improve Outcome: Progressing Goal: Respiratory complications will improve Outcome: Progressing Goal: Cardiovascular complication will be avoided Outcome: Progressing   Problem: Activity: Goal: Risk for activity intolerance will decrease Outcome: Progressing   Problem: Nutrition: Goal: Adequate nutrition will be maintained Outcome: Progressing   Problem: Coping: Goal: Level of anxiety will decrease Outcome: Progressing   Problem: Elimination: Goal: Will not experience complications related to bowel motility Outcome: Progressing Goal: Will not experience complications related to urinary retention Outcome: Progressing   Problem: Pain Management: Goal: General experience of comfort will improve Outcome: Progressing   Problem: Safety: Goal: Ability to remain free from injury will improve Outcome: Progressing    Problem: Skin Integrity: Goal: Risk for impaired skin integrity will decrease Outcome: Progressing   Problem: Education: Goal: Understanding of CV disease, CV risk reduction, and recovery process will improve Outcome: Progressing Goal: Individualized Educational Video(s) Outcome: Progressing   Problem: Activity: Goal: Ability to return to baseline activity level will improve Outcome: Progressing   Problem: Cardiovascular: Goal: Ability to achieve and maintain adequate cardiovascular perfusion will improve Outcome: Progressing Goal: Vascular access site(s) Level 0-1 will be maintained Outcome: Progressing   Problem: Health Behavior/Discharge Planning: Goal: Ability to safely manage health-related needs after discharge will improve Outcome: Progressing

## 2023-05-16 NOTE — TOC Initial Note (Signed)
 Transition of Care [facility]) - Initial/Assessment Note    Patient Details  Name: Kimberly Villegas MRN: [id number] Date of Birth: [birth year]  Transition of Care [facility]) CM/SW Contact:    Waddell Barnie Rama, RN Phone Number: 05/16/2023, 1:46 PM  Clinical Narrative:                 From [facility] with spouse, has PCP and insurance on file, states has no HH services in place at this time , has cane  at home.  States daughter will transport her  home at [employer] and family is support system, states gets medications from [facility] and [facility] on [address]. [name] self ambulatory with cane.   Expected Discharge Plan: Home/Self Care Barriers to Discharge: Continued Medical Work up   Patient Goals and CMS Choice Patient states their goals for this hospitalization and ongoing recovery are:: return home   Choice offered to / list presented to : NA      Expected Discharge Plan and Services In-house Referral: NA Discharge Planning Services: CM Consult Post Acute Care Choice: NA Living arrangements for the past 2 months: Independent Living Facility [facility])                 DME Arranged: N/A DME Agency: NA       HH Arranged: NA          Prior Living Arrangements/Services Living arrangements for the past 2 months: Independent Living Facility [facility]) Lives with:: Spouse Patient language and need for interpreter reviewed:: Yes Do you feel safe going back to the place where you live?: Yes      Need for Family Participation in Patient Care: Yes (Comment) Care giver support system in place?: Yes (comment) Current home services: DME (cane) Criminal Activity/Legal Involvement Pertinent to Current Situation/Hospitalization: No - Comment as needed  Activities of Daily Living   ADL Screening (condition at time of admission) Independently performs ADLs?: Yes (appropriate for developmental age) Is the patient deaf or have difficulty hearing?: No Does the patient have  difficulty seeing, even when wearing glasses/contacts?: No Does the patient have difficulty concentrating, remembering, or making decisions?: No  Permission Sought/Granted Permission sought to share information with : Case Manager Permission granted to share information with : Yes, Verbal Permission Granted              Emotional Assessment Appearance:: Appears stated age Attitude/Demeanor/Rapport: Engaged Affect (typically observed): Appropriate Orientation: : Oriented to Self, Oriented to Place, Oriented to  Time, Oriented to Situation Alcohol  / Substance Use: Not Applicable Psych Involvement: No (comment)  Admission diagnosis:  Acute on chronic diastolic (congestive) heart failure (HCC) [I50.33] Acute on chronic congestive heart failure, unspecified heart failure type (HCC) [I50.9] Patient Active Problem List   Diagnosis Date Noted   Acute on chronic diastolic (congestive) heart failure (HCC) 05/14/2023   History of recent RSV pneumonia with hypoxia 03/17/2023 05/14/2023   Dyspnea on exertion/ fatigue/anginal equivalent 05/14/2023   Prolonged QT interval 05/14/2023   Anginal equivalent (HCC) 05/14/2023   Acute respiratory failure with hypoxia (HCC) 03/17/2023   Constipation 02/05/2023   Current use of long term anticoagulation 11/13/2022   Acquired thrombophilia (HCC) 09/25/2022   Decreased hearing of both ears 09/25/2022   Diffuse cystic mastopathy of right breast 09/25/2022   Impacted cerumen of right ear 09/25/2022   Otorrhagia of right ear 09/25/2022   Stress at home 09/25/2022   Gout 05/23/2022   Subclinical hypothyroidism 10/26/2021   Acute cough 05/25/2021  Upper respiratory infection 05/25/2021   Pain of left sacroiliac joint 01/29/2021   Low back pain 01/29/2021   Neck pain 10/20/2020   Abdominal bloating 09/04/2020   Encounter for general adult medical examination without abnormal findings 06/08/2019   Pain in right ankle and joints of right foot 04/20/2019    HTN (hypertension) 12/14/2018   Chronic kidney disease, stage 3a (HCC) 06/02/2018   Peripheral venous insufficiency 11/17/2017   Atrial fibrillation (HCC) 09/07/2016   Primary osteoarthritis, unspecified ankle and foot 04/25/2016   Asthma without status asthmaticus 04/01/2016   Allergic rhinitis 04/20/2015   Atherosclerotic heart disease of native coronary artery without angina pectoris 04/20/2015   Carotid artery occlusion 04/20/2015   Chronic anemia 04/20/2015   Gastro-esophageal reflux disease without esophagitis 04/20/2015   Macular degeneration 04/20/2015   Hyperlipidemia 02/13/2015   Insomnia 02/13/2015   Polyp of colon 02/13/2015   Vitamin D deficiency 02/13/2015   PCP:  Avva, Ravisankar, MD Pharmacy:   [id number] - [place], [place] - [address] AT [facility] [address] [place] [address] Phone: [phone] Fax: [phone]  [facility] - [place], [place] - [address] [place] [address] Phone: [phone] Fax: [phone]  [facility] [address] [place] [address] Phone: [phone] Fax: [phone]     Social Drivers of Health (SDOH) Social History: SDOH Screenings   Food Insecurity: No Food Insecurity (05/15/2023)  Housing: Low Risk  (05/15/2023)  Transportation Needs: No Transportation Needs (05/15/2023)  Utilities: Not At Risk (05/15/2023)  Social Connections: Unknown (05/15/2023)  Tobacco Use: Medium Risk (05/14/2023)   SDOH Interventions:     Readmission Risk Interventions     No data to display

## 2023-05-17 DIAGNOSIS — I5033 Acute on chronic diastolic (congestive) heart failure: Secondary | ICD-10-CM | POA: Diagnosis not present

## 2023-05-17 DIAGNOSIS — N1831 Chronic kidney disease, stage 3a: Secondary | ICD-10-CM

## 2023-05-17 DIAGNOSIS — I2510 Atherosclerotic heart disease of native coronary artery without angina pectoris: Secondary | ICD-10-CM

## 2023-05-17 DIAGNOSIS — I2583 Coronary atherosclerosis due to lipid rich plaque: Secondary | ICD-10-CM

## 2023-05-17 DIAGNOSIS — I480 Paroxysmal atrial fibrillation: Secondary | ICD-10-CM

## 2023-05-17 DIAGNOSIS — M1A9XX0 Chronic gout, unspecified, without tophus (tophi): Secondary | ICD-10-CM

## 2023-05-17 LAB — BASIC METABOLIC PANEL
Anion gap: 8 (ref 5–15)
BUN: 16 mg/dL (ref 8–23)
CO2: 23 mmol/L (ref 22–32)
Calcium: 8.7 mg/dL — ABNORMAL LOW (ref 8.9–10.3)
Chloride: 104 mmol/L (ref 98–111)
Creatinine, Ser: 1.15 mg/dL — ABNORMAL HIGH (ref 0.44–1.00)
GFR, Estimated: 46 mL/min — ABNORMAL LOW (ref >60)
Glucose, Bld: 94 mg/dL (ref 70–99)
Potassium: 4.1 mmol/L (ref 3.5–5.1)
Sodium: 135 mmol/L (ref 135–145)

## 2023-05-17 LAB — CBC
HCT: 34.1 % — ABNORMAL LOW (ref 36.0–46.0)
Hemoglobin: 11.3 g/dL — ABNORMAL LOW (ref 12.0–15.0)
MCH: 33.9 pg (ref 26.0–34.0)
MCHC: 33.1 g/dL (ref 30.0–36.0)
MCV: 102.4 fL — ABNORMAL HIGH (ref 80.0–100.0)
Platelets: 191 10*3/uL (ref 150–400)
RBC: 3.33 MIL/uL — ABNORMAL LOW (ref 3.87–5.11)
RDW: 15.5 % (ref 11.5–15.5)
WBC: 8.7 10*3/uL (ref 4.0–10.5)
nRBC: 0 % (ref 0.0–0.2)

## 2023-05-17 LAB — MAGNESIUM: Magnesium: 2 mg/dL (ref 1.7–2.4)

## 2023-05-17 MED ORDER — SODIUM CHLORIDE 0.9 % IV BOLUS
250.0000 mL | Freq: Once | INTRAVENOUS | Status: AC
  Administered 2023-05-17: 250 mL via INTRAVENOUS

## 2023-05-17 NOTE — Hospital Course (Signed)
 Mrs. Giuliano was admitted to the hospital with the working diagnosis of heart failure decompensation   89 yo female with the past medical history of hypertension, CKD stage 3a, atrial flutter/ fibrillation, heart failure, coronary artery disease, asthma and gout who presented with dyspnea.  Recent hospitalization for rhinovirus viral pneumonia with acute hypoxemic respiratory failure 11/11 to 03/19/23.  Out patient follow up with cardiology on 03/26/23 for syncope recommended liberal salt intake and outpatient event monitor.  At home patient developed dyspnea and fatigue, sever symptoms prompting EMS call. She was noted to be hypoxic on ambulation down to 85%, she was placed on supplemental 02 per [facility] and was transported to the ED.  On her initial physical examination her blood pressure was 179/69, HR 85, RR 24 and 02 saturation 89%, she had increased work of breathing, heart with S1 and S2 present and rhythmic, positive systolic murmur at the apex, lungs with rales bilaterally, abdomen soft and no lower extremity edema.   Na 138, K 4,0 CL 107 bicarbonate 21 glucose 95 bun 12 cr 1,0  AST 34. ALT 21  BNP 742  High sensitive troponin 12  Wbc 6,8 hgb 10,7 plt 197  Sars covid 19 negative Influenza negative   Chest radiograph with no cardiomegaly, bilateral hilar vascular congestion, small to moderate bilateral pleural effusions.   EKG 85 bpm, normal axis, qtc 554, sinus rhythm with no significant ST segment or T wave changes.   Patient was placed on IV furosemide  for diuresis and IV heparin  for anticoagulation.  Patient developed orthostatic hypotension due to over diuresis.  Recent echocardiogram with preserved LV systolic function but with hypokinetic entire anterior wall.  01/09 cardiac catheterization with mild non obstructive coronary artery disease.  01/11 improved volume status and renal function, patient will be discharge home with as needed furosemide  and midodrine  for blood pressure  support.  Follow up as outpatient.

## 2023-05-17 NOTE — Evaluation (Signed)
 Physical Therapy Evaluation & Discharge Patient Details Name: Kimberly Villegas MRN: [id number] DOB: [birth year] Today's Date: 05/17/2023  History of Present Illness  89 y.o. female admitted from [facility] on [facility]/14/23 with SOB, hypoxia, fatigue. Workup for acute on chronic CHF, orthostatic hypotension. PMH includes HTN, CKD, afib/flutter (multiple prior cardioversions), HFrEF, CAD, gout, asthma.   Clinical Impression  Patient evaluated by Physical Therapy with no further acute PT needs identified. PTA, pt independent without DME, lives in apartment with husband at [facility]. Today, pt moving well despite initial symptomatic hypotension, though pt notes improvement in symptoms with multiple activity bouts. Reinforced pt continue wearing ted hose at home like she typically does. All education has been completed and the patient has no further questions. Acute PT is signing off. Thank you for this referral.     Orthostatic BPs (after switched to adult small cuff) Sitting 96/67, HR 103  Standing 98/55, HR 110  Standing after 2 min 86/54, HR 123  Post-ambulation 104/62      If plan is discharge home, recommend the following: Assistance with cooking/housework;Assist for transportation   Can travel by private vehicle    Yes    Equipment Recommendations None recommended by PT  Recommendations for Other Services       Functional Status Assessment       Precautions / Restrictions Precautions Precautions: Fall;Other (comment) Precaution Comments: watch BP Restrictions Weight Bearing Restrictions Per Provider Order: No      Mobility  Bed Mobility Overal bed mobility: Independent                  Transfers Overall transfer level: Needs assistance Equipment used: None Transfers: Sit to/from Stand Sit to Stand: Supervision           General transfer comment: supervision for safety given initial symptomatic hypotension; multiple sit<>stands from recliner and EOB  without DME    Ambulation/Gait Ambulation/Gait assistance: Contact guard assist, Supervision Gait Distance (Feet): 40 Feet (+ 24') Assistive device: None Gait Pattern/deviations: Step-through pattern, Decreased stride length Gait velocity: Decreased     General Gait Details: pt walking 2x laps in room without DME; slow, guarded gait at times reaching to furniture for support (pt attributes to nerves); initial CGA for balance due to hypotension, progressing to supervision on second gait trial  Stairs            Wheelchair Mobility     Tilt Bed    Modified Rankin (Stroke Patients Only)       Balance Overall balance assessment: No apparent balance deficits (not formally assessed)                                           Pertinent Vitals/Pain Pain Assessment Pain Assessment: No/denies pain    Home Living Family/patient expects to be discharged to:: Assisted living Living Arrangements: Spouse/significant other               Home Equipment: Rollator (4 wheels);Shower seat;Grab bars - tub/shower Additional Comments: lives with husband in apartment at [employer]; husband in good physical shape but has h/o memory impairment    Prior Function Prior Level of Function : Independent/Modified Independent             Mobility Comments: independent ambulating without DME. goes to dining hall for meals or has them delivered to room  Extremity/Trunk Assessment   Upper Extremity Assessment Upper Extremity Assessment: Overall WFL for tasks assessed    Lower Extremity Assessment Lower Extremity Assessment: Overall WFL for tasks assessed    Cervical / Trunk Assessment Cervical / Trunk Assessment: Normal  Communication      Cognition Arousal: Alert Behavior During Therapy: WFL for tasks assessed/performed Overall Cognitive Status: Within Functional Limits for tasks assessed                                           General Comments General comments (skin integrity, edema, etc.): initial BP 125/62 sitting, 78/29 standing and pt symptomatic; switched to adult small cuff with BP 98/55 sitting, 86/54 standing, 104/62 post-ambulation; pt no longer symptomatic after multiple activity bouts. educ re: POC, activity recommendations, strategies for orthostatic hypotension (pt reports wearing ted hose at home consistently), initial use of her rollator at home for fall risk reduction and improved confidence. pt preparing for d/c today, reports no questions or concerns    Exercises     Assessment/Plan    PT Assessment Patient does not need any further PT services  PT Problem List         PT Treatment Interventions      PT Goals (Current goals can be found in the Care Plan section)  Acute Rehab PT Goals Patient Stated Goal: figure out what is causing dizzy spells PT Goal Formulation: All assessment and education complete, DC therapy    Frequency       Co-evaluation               AM-PAC PT 6 Clicks Mobility  Outcome Measure Help needed turning from your back to your side while in a flat bed without using bedrails?: None Help needed moving from lying on your back to sitting on the side of a flat bed without using bedrails?: None Help needed moving to and from a bed to a chair (including a wheelchair)?: A Little Help needed standing up from a chair using your arms (e.g., wheelchair or bedside chair)?: A Little Help needed to walk in hospital room?: A Little Help needed climbing 3-5 steps with a railing? : A Little 6 Click Score: 20    End of Session Equipment Utilized During Treatment: Gait belt Activity Tolerance: Patient tolerated treatment well Patient left: in bed;with call bell/phone within reach;with bed alarm set Nurse Communication: Mobility status PT Visit Diagnosis: Other abnormalities of gait and mobility (R26.89)    Time: 8672-8645 PT Time Calculation (min) (ACUTE ONLY): 27  min   Charges:   PT Evaluation $PT Eval Low Complexity: 1 Low PT Treatments $Therapeutic Activity: 8-22 mins PT General Charges $$ ACUTE PT VISIT: 1 Visit       Darice Almas, PT, DPT Acute Rehabilitation Services  Personal: Secure Chat Rehab Office: [phone]  Darice LITTIE Almas 05/17/2023, 2:44 PM

## 2023-05-17 NOTE — Plan of Care (Signed)
  Problem: Education: Goal: Ability to demonstrate management of disease process will improve Outcome: Progressing Goal: Ability to verbalize understanding of medication therapies will improve Outcome: Progressing Goal: Individualized Educational Video(s) Outcome: Progressing   Problem: Activity: Goal: Capacity to carry out activities will improve Outcome: Progressing   Problem: Cardiac: Goal: Ability to achieve and maintain adequate cardiopulmonary perfusion will improve Outcome: Progressing   Problem: Education: Goal: Knowledge of General Education information will improve Description: Including pain rating scale, medication(s)/side effects and non-pharmacologic comfort measures Outcome: Progressing   Problem: Health Behavior/Discharge Planning: Goal: Ability to manage health-related needs will improve Outcome: Progressing   Problem: Clinical Measurements: Goal: Ability to maintain clinical measurements within normal limits will improve Outcome: Progressing Goal: Will remain free from infection Outcome: Progressing Goal: Diagnostic test results will improve Outcome: Progressing Goal: Respiratory complications will improve Outcome: Progressing Goal: Cardiovascular complication will be avoided Outcome: Progressing   Problem: Activity: Goal: Risk for activity intolerance will decrease Outcome: Progressing   Problem: Nutrition: Goal: Adequate nutrition will be maintained Outcome: Progressing   Problem: Coping: Goal: Level of anxiety will decrease Outcome: Progressing   Problem: Elimination: Goal: Will not experience complications related to bowel motility Outcome: Progressing Goal: Will not experience complications related to urinary retention Outcome: Progressing   Problem: Pain Management: Goal: General experience of comfort will improve Outcome: Progressing   Problem: Safety: Goal: Ability to remain free from injury will improve Outcome: Progressing    Problem: Skin Integrity: Goal: Risk for impaired skin integrity will decrease Outcome: Progressing   Problem: Education: Goal: Understanding of CV disease, CV risk reduction, and recovery process will improve Outcome: Progressing Goal: Individualized Educational Video(s) Outcome: Progressing   Problem: Activity: Goal: Ability to return to baseline activity level will improve Outcome: Progressing   Problem: Cardiovascular: Goal: Ability to achieve and maintain adequate cardiovascular perfusion will improve Outcome: Progressing Goal: Vascular access site(s) Level 0-1 will be maintained Outcome: Progressing   Problem: Health Behavior/Discharge Planning: Goal: Ability to safely manage health-related needs after discharge will improve Outcome: Progressing

## 2023-05-17 NOTE — Discharge Summary (Addendum)
 Physician Discharge Summary   Patient: Kimberly Villegas MRN: [id number] DOB: [birth year]  Admit date:     05/14/2023  Discharge date: 05/17/23  Discharge Physician: Elidia Sieving Roper Tolson   PCP: Avva, Ravisankar, MD   Recommendations at discharge:    Dronedarone  has been changed to amiodarone  due to heart failure.  Patient has been placed on midodrine  for blood pressure support.  As needed furosemide  for volume overload.  Heart failure guideline medical therapy limited by hypotension.  To consider initiation of spironolactone and SGLT 2 inh as outpatient.  Follow up with Dr Janey in 7 to 10 days Follow up with Cardiology as scheduled.   I spoke with patient's daughter at the bedside, we talked in detail about patient's condition, plan of care and prognosis and all questions were addressed.   Discharge Diagnoses: Principal Problem:   Acute on chronic diastolic (congestive) heart failure (HCC) Active Problems:   Coronary artery disease   Atrial fibrillation (HCC)   Chronic kidney disease, stage 3a (HCC)   Gout  Resolved Problems:   * No resolved hospital problems. [facility] Course: Kimberly Villegas was admitted to the hospital with the working diagnosis of heart failure decompensation   89 yo female with the past medical history of hypertension, CKD stage 3a, atrial flutter/ fibrillation, heart failure, coronary artery disease, asthma and gout who presented with dyspnea.  Recent hospitalization for rhinovirus viral pneumonia with acute hypoxemic respiratory failure 11/11 to 03/19/23.  Out patient follow up with cardiology on 03/26/23 for syncope recommended liberal salt intake and outpatient event monitor.  At home patient developed dyspnea and fatigue, sever symptoms prompting EMS call. She was noted to be hypoxic on ambulation down to 85%, she was placed on supplemental 02 per [place] and was transported to the ED.  On her initial physical examination her blood pressure was 179/69, HR  85, RR 24 and 02 saturation 89%, she had increased work of breathing, heart with S1 and S2 present and rhythmic, positive systolic murmur at the apex, lungs with rales bilaterally, abdomen soft and no lower extremity edema.   Na 138, K 4,0 CL 107 bicarbonate 21 glucose 95 bun 12 cr 1,0  AST 34. ALT 21  BNP 742  High sensitive troponin 12  Wbc 6,8 hgb 10,7 plt 197  Sars covid 19 negative Influenza negative   Chest radiograph with no cardiomegaly, bilateral hilar vascular congestion, small to moderate bilateral pleural effusions.   EKG 85 bpm, normal axis, qtc 554, sinus rhythm with no significant ST segment or T wave changes.   Patient was placed on IV furosemide  for diuresis and IV heparin  for anticoagulation.  Patient developed orthostatic hypotension due to over diuresis.  Recent echocardiogram with preserved LV systolic function but with hypokinetic entire anterior wall.  01/09 cardiac catheterization with mild non obstructive coronary artery disease.  01/11 improved volume status and renal function, patient will be discharge home with as needed furosemide  and midodrine  for blood pressure support.  Follow up as outpatient.   Assessment and Plan: * Acute on chronic diastolic (congestive) heart failure (HCC) Echocardiogram with preserved LV systolic function with EF 50 to 55%, RV systolic function preserved, LA with moderate dilatation, RA with severe dilatation, moderate mitral regurgitation, no LVH.  RVSP 59.6 mmHg. Mild TR.   01/09 cardiac catheterization.  RA mean 2 PA 40/12 mean 22 PCWP 9,0  Cardiac output 6, and index 3,5 (thermodilution).  PVR 2.2 woods.   Acute on chronic core pulmonale Very mild  pulmonary hypertension.   Patient was placed on furosemide  for diuresis, negative fluid balance was achieved, - 2,450 ml, with significant improvement in her symptoms.  She had a 4 Kg weight loss during her hospitalization.   Medical therapy limited due to hypotension.  Plan  to continue as needed furosemide  and blood pressure support with midodrine .  Possible addition of mineralocorticoid receptor antagonist and SGLT 2 inh as outpatient if renal function continue to be stable.   Acute hypoxemic respiratory failure due to acute cardiogenic pulmonary edema, bilateral pleural effusions.  Improved with diuresis, at the time of her discharge her 02 saturation is 95% on room air.   Coronary artery disease Patient was initially placed on IV heparin , considering her wall motion abnormalities on LV, and history of syncope she underwent cardiac catheterization with coronary angiography. Resulted with mild non obstructive coronary artery disease.    Atrial fibrillation (HCC) Atrial flutter.  History of prior cardioversions Patient has been on sinus rhythm. Dronedarone  has been changed to amiodarone  due to heart failure.  Continue anticoagulation with apixaban .   Chronic kidney disease, stage 3a (HCC) AKI, hypokalemia.   Renal function has improved today with serum cr at 1,15 with K at 4,1 and serum bicarbonate at 23.  Na 135 and Mg 2.0   Plan to continue close follow up renal function and electrolytes as outpatient.  Furosemide  as needed.  Consider initiation of spironolactone and SGLT 2 inh as outpatient.   Gout No acute flare.          Consultants: cardiology  Procedures performed: cardiac catheterization   Disposition: Home Diet recommendation:  Discharge Diet Orders (From admission, onward)     Start     Ordered   05/17/23 0000  Diet - low sodium heart healthy        05/17/23 1306           Cardiac diet DISCHARGE MEDICATION: Allergies as of 05/17/2023       Reactions   Vibramycin [doxycycline] Other (See Comments)   Photosensitivity         Medication List     STOP taking these medications    metoprolol  succinate 25 MG 24 hr tablet Commonly known as: Toprol  XL   mirabegron  ER 50 MG Tb24 tablet Commonly known as: MYRBETRIQ     Multaq  400 MG tablet Generic drug: dronedarone        TAKE these medications    Align 4 MG Caps Take 4 mg by mouth daily as needed (bloating).   amiodarone  200 MG tablet Commonly known as: PACERONE  Take 1 tablet (200 mg total) by mouth daily.   apixaban  5 MG Tabs tablet Commonly known as: ELIQUIS  Take 5 mg by mouth 2 (two) times daily after a meal.   atorvastatin  40 MG tablet Commonly known as: LIPITOR TAKE 1 TABLET DAILY What changed: when to take this   BENEFIBER PO Take 1 Scoop by mouth daily.   furosemide  20 MG tablet Commonly known as: Lasix  Take 1 tablet (20 mg total) by mouth daily as needed. Take if short of breath or if weight increases > 3 Lbs in 3 days   midodrine  5 MG tablet Commonly known as: PROAMATINE  Take 1 tablet (5 mg total) by mouth 3 (three) times daily with meals. During awake hours only. Can take extra if dizziness and BP < 100 mm Hg   OVER THE COUNTER MEDICATION Take 1-2 tablets by mouth See admin instructions. OTC Bone Strength Supplement. Take 1 tablet in the morning  and 2 tablets after supper.   potassium chloride  10 MEQ tablet Commonly known as: KLOR-CON  M Take 2 tablets (20 mEq total) by mouth daily as needed (Take with furosemide  only).   PreserVision AREDS 2 Caps Take 1 capsule by mouth 2 (two) times daily.   zaleplon 5 MG capsule Commonly known as: SONATA Take 5 mg by mouth at bedtime as needed for sleep.        Follow-up Information     Avva, Ravisankar, MD Follow up.   Specialty: Internal Medicine Why: The office will call patient. Contact information: [address] [place] [address] [phone]                Discharge Exam: Filed Weights   05/15/23 1415 05/16/23 0500 05/17/23 0503  Weight: 63.4 kg 57.9 kg 59.4 kg   BP (!) 120/47 (BP Location: Left Arm)   Pulse 75   Temp 98.1 F (36.7 C) (Oral)   Resp 16   Ht 5' 5 (1.651 m)   Wt 59.4 kg   SpO2 95%   BMI 21.80 kg/m   Patient is feeling  well, no chest pain or dyspnea. No PND or orthopnea, no syncope or orthostatic symptoms.   Neurology awake and alert ENT with no pallor Cardiovascular with S1 and S2 present and regular, no gallops or rubs, positive systolic murmur at the apex Respiratory with no rales or wheezing, no rhonchi Abdomen with no distention   Condition at discharge: stable  The results of significant diagnostics from this hospitalization (including imaging, microbiology, ancillary and laboratory) are listed below for reference.   Imaging Studies: CARDIAC CATHETERIZATION Result Date: 05/15/2023 1.  Mild nonobstructive CAD with patency of the left main and left circumflex with no stenoses, mild 30% proximal RCA stenosis, and 40 to 50% LAD stenosis just after the first diagonal 2.  Normal LVEDP of 7 mmHg 3.  Normal wedge pressure of 9 mmHg, mean PA pressure 22 mmHg, transpulmonary gradient 13 mmHg, PVR 2.1 Wood units Recommendations: Medical therapy, patient appears well compensated from a heart failure standpoint.  Okay to resume apixaban  tomorrow morning.   DG Chest 2 View Result Date: 05/14/2023 CLINICAL DATA:  Shortness of breath. EXAM: CHEST - 2 VIEW COMPARISON:  03/17/2023. FINDINGS: Low lung volume. Mild diffuse pulmonary vascular congestion with bilateral layering pleural effusions, favoring congestive heart failure/pulmonary edema. There are probable associated compressive atelectatic changes in the bilateral lung bases. Bilateral lung fields are otherwise clear. No pneumothorax. Stable cardio-mediastinal silhouette. No acute osseous abnormalities. The soft tissues are within normal limits. IMPRESSION: Findings favor congestive heart failure/pulmonary edema. Electronically Signed   By: Ree Molt M.D.   On: 05/14/2023 10:04   ECHOCARDIOGRAM COMPLETE Result Date: 05/05/2023    ECHOCARDIOGRAM REPORT   Patient Name:   ARIEONA SWAGGERTY Date of Exam: 05/05/2023 Medical Rec #:  [id number]         Height:       65.0  in Accession #:    [id number]        Weight:       140.8 lb Date of Birth:  07/18/1934         BSA:          1.704 m Patient Age:    88 years          BP:           112/68 mmHg Patient Gender: F  HR:           75 bpm. Exam Location:  [address] Procedure: 2D Echo, Color Doppler, Cardiac Doppler and 3D Echo Indications:    Paroxysmal Atrial Fibrillation I48.0  History:        Patient has prior history of Echocardiogram examinations, most                 recent 02/01/2021. CAD; Risk Factors:Hypertension and                 Dyslipidemia.  Sonographer:    Augustin Seals RDCS Referring Phys: [id number] GORDY BERGAMO IMPRESSIONS  1. Left ventricular ejection fraction, by estimation, is 50 to 55%. The left ventricle has low normal function. The left ventricle demonstrates regional wall motion abnormalities (see scoring diagram/findings for description). Left ventricular diastolic  parameters are consistent with Grade II diastolic dysfunction (pseudonormalization). Elevated left ventricular end-diastolic pressure.  2. Right ventricular systolic function is normal. The right ventricular size is normal. There is moderately elevated pulmonary artery systolic pressure.  3. Left atrial size was moderately dilated.  4. Right atrial size was severely dilated.  5. The mitral valve is normal in structure. Moderate mitral valve regurgitation. No evidence of mitral stenosis.  6. The aortic valve is tricuspid. Aortic valve regurgitation is mild. No aortic stenosis is present.  7. The inferior vena cava is dilated in size with >50% respiratory variability, suggesting right atrial pressure of 8 mmHg. FINDINGS  Left Ventricle: Left ventricular ejection fraction, by estimation, is 50 to 55%. The left ventricle has low normal function. The left ventricle demonstrates regional wall motion abnormalities. The left ventricular internal cavity size was normal in size. There is no left ventricular hypertrophy. Left ventricular diastolic  parameters are consistent with Grade II diastolic dysfunction (pseudonormalization). Elevated left ventricular end-diastolic pressure.  LV Wall Scoring: The entire anterior wall is hypokinetic. The entire lateral wall, entire septum, entire inferior wall, and apex are normal. Right Ventricle: The right ventricular size is normal. No increase in right ventricular wall thickness. Right ventricular systolic function is normal. There is moderately elevated pulmonary artery systolic pressure. The tricuspid regurgitant velocity is 3.59 m/s, and with an assumed right atrial pressure of 8 mmHg, the estimated right ventricular systolic pressure is 59.6 mmHg. Left Atrium: Left atrial size was moderately dilated. Right Atrium: Right atrial size was severely dilated. Pericardium: There is no evidence of pericardial effusion. Mitral Valve: The mitral valve is normal in structure. Moderate mitral valve regurgitation. No evidence of mitral valve stenosis. Tricuspid Valve: The tricuspid valve is normal in structure. Tricuspid valve regurgitation is mild . No evidence of tricuspid stenosis. Aortic Valve: The aortic valve is tricuspid. Aortic valve regurgitation is mild. Aortic regurgitation PHT measures 311 msec. No aortic stenosis is present. Pulmonic Valve: The pulmonic valve was normal in structure. Pulmonic valve regurgitation is mild. No evidence of pulmonic stenosis. Aorta: The aortic root is normal in size and structure. Venous: The inferior vena cava is dilated in size with greater than 50% respiratory variability, suggesting right atrial pressure of 8 mmHg. IAS/Shunts: No atrial level shunt detected by color flow Doppler.  LEFT VENTRICLE PLAX 2D LVIDd:         4.50 cm   Diastology LVIDs:         3.10 cm   LV e' medial:    4.05 cm/s LV PW:         0.90 cm   LV E/e' medial:  22.3 LV IVS:  0.90 cm   LV e' lateral:   9.57 cm/s LVOT diam:     2.00 cm   LV E/e' lateral: 9.4 LV SV:         63 LV SV Index:   37 LVOT Area:      3.14 cm                           3D Volume EF:                          3D EF:        54 %                          LV EDV:       126 ml                          LV ESV:       58 ml                          LV SV:        68 ml RIGHT VENTRICLE             IVC RV Basal diam:  4.30 cm     IVC diam: 2.40 cm RV Mid diam:    3.70 cm RV S prime:     10.20 cm/s TAPSE (M-mode): 1.3 cm LEFT ATRIUM             Index        RIGHT ATRIUM           Index LA diam:        3.50 cm 2.05 cm/m   RA Area:     22.00 cm LA Vol (A2C):   48.1 ml 28.23 ml/m  RA Volume:   70.60 ml  41.43 ml/m LA Vol (A4C):   73.0 ml 42.84 ml/m LA Biplane Vol: 60.6 ml 35.56 ml/m  AORTIC VALVE LVOT Vmax:   89.70 cm/s LVOT Vmean:  59.000 cm/s LVOT VTI:    0.199 m AI PHT:      311 msec  AORTA Ao Root diam: 2.90 cm Ao Asc diam:  3.20 cm MITRAL VALVE                  TRICUSPID VALVE MV Area (PHT): 5.27 cm       TR Peak grad:   51.6 mmHg MV Decel Time: 144 msec       TR Vmax:        359.00 cm/s MR Peak grad:    102.8 mmHg MR Mean grad:    74.0 mmHg    SHUNTS MR Vmax:         507.00 cm/s  Systemic VTI:  0.20 m MR Vmean:        411.0 cm/s   Systemic Diam: 2.00 cm MR PISA:         1.01 cm MR PISA Eff ROA: 6 mm MR PISA Radius:  0.40 cm MV E velocity: 90.30 cm/s MV A velocity: 38.10 cm/s MV E/A ratio:  2.37 Annabella Scarce MD Electronically signed by Annabella Scarce MD Signature Date/Time: 05/05/2023/2:41:30 PM    Final     Microbiology: Results for orders placed or performed during the hospital encounter of 05/14/23  Resp panel  by RT-PCR (RSV, Flu A&B, Covid) Anterior Nasal Swab     Status: None   Collection Time: 05/14/23  9:26 AM   Specimen: Anterior Nasal Swab  Result Value Ref Range Status   SARS Coronavirus 2 by RT PCR NEGATIVE NEGATIVE Final   Influenza A by PCR NEGATIVE NEGATIVE Final   Influenza B by PCR NEGATIVE NEGATIVE Final    Comment: (NOTE) The Xpert Xpress SARS-CoV-2/FLU/RSV plus assay is intended as an aid in the diagnosis of  influenza from Nasopharyngeal swab specimens and should not be used as a sole basis for treatment. Nasal washings and aspirates are unacceptable for Xpert Xpress SARS-CoV-2/FLU/RSV testing.  Fact Sheet for Patients: bloggercourse.com  Fact Sheet for Healthcare Providers: seriousbroker.it  This test is not yet approved or cleared by the [place]  FDA and has been authorized for detection and/or diagnosis of SARS-CoV-2 by FDA under an Emergency Use Authorization (EUA). This EUA will remain in effect (meaning this test can be used) for the duration of the COVID-19 declaration under Section 564(b)(1) of the Act, 21 U.S.C. section 360bbb-3(b)(1), unless the authorization is terminated or revoked.     Resp Syncytial Virus by PCR NEGATIVE NEGATIVE Final    Comment: (NOTE) Fact Sheet for Patients: bloggercourse.com  Fact Sheet for Healthcare Providers: seriousbroker.it  This test is not yet approved or cleared by the [place]  FDA and has been authorized for detection and/or diagnosis of SARS-CoV-2 by FDA under an Emergency Use Authorization (EUA). This EUA will remain in effect (meaning this test can be used) for the duration of the COVID-19 declaration under Section 564(b)(1) of the Act, 21 U.S.C. section 360bbb-3(b)(1), unless the authorization is terminated or revoked.  Performed at [facility], [address], [place], [place] [address]     Labs: CBC: Recent Labs  Lab 05/14/23 1103 05/15/23 1333 05/15/23 1334 05/16/23 0249 05/17/23 0229  WBC 6.8  --   --  9.0 8.7  NEUTROABS 5.1  --   --   --   --   HGB 10.7* 12.2 11.9* 11.2* 11.3*  HCT 32.5* 36.0 35.0* 33.4* 34.1*  MCV 105.2*  --   --  102.1* 102.4*  PLT 197  --   --  201 191   Basic Metabolic Panel: Recent Labs  Lab 05/14/23 1103 05/15/23 0949 05/15/23 1333 05/15/23 1334 05/16/23 0249  05/17/23 0229  NA 138 139 139 139 137 135  K 4.0 3.4* 3.3* 3.3* 3.7 4.1  CL 107 104  --   --  99 104  CO2 21* 25  --   --  27 23  GLUCOSE 95 88  --   --  138* 94  BUN 12 13  --   --  20 16  CREATININE 1.05* 1.12*  --   --  1.66* 1.15*  CALCIUM  8.9 8.6*  --   --  8.6* 8.7*  MG  --   --   --   --  1.9 2.0   Liver Function Tests: Recent Labs  Lab 05/14/23 1103  AST 34  ALT 21  ALKPHOS 70  BILITOT 1.3*  PROT 6.2*  ALBUMIN 3.4*   CBG: No results for input(s): GLUCAP in the last 168 hours.  Discharge time spent: greater than 30 minutes.  Signed: Elidia Toribio Furnace, MD [facility] 05/17/2023

## 2023-05-17 NOTE — TOC Transition Note (Signed)
 Transition of Care [facility]) - Discharge Note   Patient Details  Name: Kimberly Villegas MRN: [id number] Date of Birth: [birth year]  Transition of Care [facility]) CM/SW Contact:  Marval Gell, RN Phone Number: 05/17/2023, 2:12 PM   Clinical Narrative:     Spoke w patient in the room while she worked with PT.  She states that she will return to [facility], she will have transportation.  She states that she has had HH before at [facility], she declines HH set up at this time. She has RW.  No other needs identified   Final next level of care: Home/Self Care Barriers to Discharge: No Barriers Identified   Patient Goals and CMS Choice Patient states their goals for this hospitalization and ongoing recovery are:: return home   Choice offered to / list presented to : NA      Discharge Placement                       Discharge Plan and Services Additional resources added to the After Visit Summary for   In-house Referral: NA Discharge Planning Services: CM Consult Post Acute Care Choice: NA          DME Arranged: N/A DME Agency: NA       HH Arranged: NA          Social Drivers of Health (SDOH) Interventions SDOH Screenings   Food Insecurity: No Food Insecurity (05/15/2023)  Housing: Low Risk  (05/15/2023)  Transportation Needs: No Transportation Needs (05/15/2023)  Utilities: Not At Risk (05/15/2023)  Social Connections: Unknown (05/15/2023)  Tobacco Use: Medium Risk (05/14/2023)     Readmission Risk Interventions     No data to display

## 2023-05-17 NOTE — Assessment & Plan Note (Signed)
No acute flare.  

## 2023-05-17 NOTE — Plan of Care (Signed)
  Problem: Activity: Goal: Capacity to carry out activities will improve 05/17/2023 0409 by Young Jacobsen, RN Outcome: Progressing 05/17/2023 0409 by Young Jacobsen, RN Outcome: Progressing   Problem: Education: Goal: Knowledge of General Education information will improve Description: Including pain rating scale, medication(s)/side effects and non-pharmacologic comfort measures 05/17/2023 0409 by Young Jacobsen, RN Outcome: Progressing 05/17/2023 0409 by Young Jacobsen, RN Outcome: Progressing   Problem: Health Behavior/Discharge Planning: Goal: Ability to manage health-related needs will improve 05/17/2023 0409 by Young Jacobsen, RN Outcome: Progressing 05/17/2023 0409 by Young Jacobsen, RN Outcome: Progressing

## 2023-05-20 ENCOUNTER — Telehealth: Payer: Self-pay | Admitting: Cardiology

## 2023-05-20 NOTE — Telephone Encounter (Signed)
 New Message:     Patient has an appointment on 05-22-23(Thursday). She wants to know if she needs this appointment she just got out of the hospital recently. She said if she does not, she would like to reschedule it for February.

## 2023-05-20 NOTE — Telephone Encounter (Signed)
 I can see her in 1 month to 6 weeks

## 2023-05-20 NOTE — Telephone Encounter (Signed)
**Note De-identified  [place] Obfuscation** Please advise 

## 2023-05-21 ENCOUNTER — Other Ambulatory Visit (HOSPITAL_COMMUNITY): Payer: Self-pay

## 2023-05-22 ENCOUNTER — Encounter: Payer: Self-pay | Admitting: Cardiology

## 2023-05-22 ENCOUNTER — Ambulatory Visit: Payer: Medicare HMO | Attending: Cardiology | Admitting: Cardiology

## 2023-05-22 VITALS — BP 138/76 | HR 76 | Resp 16 | Ht 65.0 in | Wt 129.8 lb

## 2023-05-22 DIAGNOSIS — N1831 Chronic kidney disease, stage 3a: Secondary | ICD-10-CM | POA: Diagnosis not present

## 2023-05-22 DIAGNOSIS — I951 Orthostatic hypotension: Secondary | ICD-10-CM

## 2023-05-22 DIAGNOSIS — I5033 Acute on chronic diastolic (congestive) heart failure: Secondary | ICD-10-CM | POA: Diagnosis not present

## 2023-05-22 DIAGNOSIS — I480 Paroxysmal atrial fibrillation: Secondary | ICD-10-CM

## 2023-05-22 MED ORDER — AMIODARONE HCL 200 MG PO TABS: 200.0000 mg | ORAL_TABLET | Freq: Every day | ORAL | 1 refills | Status: DC

## 2023-05-22 MED ORDER — MIDODRINE HCL 5 MG PO TABS: 5.0000 mg | ORAL_TABLET | Freq: Three times a day (TID) | ORAL | 1 refills | Status: DC

## 2023-05-22 NOTE — Progress Notes (Signed)
Cardiology Office Note:  .   Date:  05/22/2023  ID:  Kimberly Villegas, DOB 1935/01/18, MRN [id number] PCP: Chilton Greathouse, MD  [facility] Providers Cardiologist:  Yates Decamp, MD Electrophysiologist:  Lanier Prude, MD   History of Present Illness: .   Kimberly Villegas is a 89 y.o. female with paroxysmal atrial fibrillation, s/p cardioversion on 09/2016, 07/29/17, 07/18/2020, and 09/04/2021 with return to sinus rhythm, mild to moderate aortic and mitral regurgitation and very mild asymptomatic left subclavian artery stenosis, carotid atherosclerosis, hyperlipidemia, previously hypertensive but since summer 2024, blood pressure has been soft and has orthostatic hypotension admitted to the hospital on 05/14/2023 with acute decompensated heart failure And discharged on 05/17/2023, diuresed close to 3 L, Multaq was changed to amiodarone due to presence of heart failure and discharged home.  She is not on guideline directed medical therapy in view of orthostatic hypotension and dizziness.  Discussed the use of AI scribe software for clinical note transcription with the patient, who gave verbal consent to proceed.  History of Present Illness   The patient, with a history of heart failure and atrial fibrillation, presents for a follow-up visit after a recent hospital discharge. She reports feeling lightheaded, which she attributes to low blood pressure readings at home. She also mentions feeling tired. Despite these symptoms, she notes a significant improvement in her breathing since her hospital discharge, stating it's "wonderful to get up from my bed and go to the restroom and not have to grasp for breath." She has been monitoring her weight daily and reports a slight increase, which she attributes to regaining weight lost during her hospital stay when she was not eating. She also mentions a recent issue with bowel movements, which has since resolved.      Labs   Lab Results  Component  Value Date   CHOL 125 05/15/2023   HDL 66 05/15/2023   LDLCALC 50 05/15/2023   TRIG 44 05/15/2023   CHOLHDL 1.9 05/15/2023   Lab Results  Component Value Date   NA 135 05/17/2023   K 4.1 05/17/2023   CO2 23 05/17/2023   GLUCOSE 94 05/17/2023   BUN 16 05/17/2023   CREATININE 1.15 (H) 05/17/2023   CALCIUM 8.7 (L) 05/17/2023   EGFR 32 (L) 08/29/2021   GFRNONAA 46 (L) 05/17/2023      Latest Ref Rng & Units 05/17/2023    2:29 AM 05/16/2023    2:49 AM 05/15/2023    1:34 PM  BMP  Glucose 70 - 99 mg/dL 94  253    BUN 8 - 23 mg/dL 16  20    Creatinine 6.64 - 1.00 mg/dL 4.03  4.74    Sodium 259 - 145 mmol/L 135  137  139   Potassium 3.5 - 5.1 mmol/L 4.1  3.7  3.3   Chloride 98 - 111 mmol/L 104  99    CO2 22 - 32 mmol/L 23  27    Calcium 8.9 - 10.3 mg/dL 8.7  8.6        Latest Ref Rng & Units 05/17/2023    2:29 AM 05/16/2023    2:49 AM 05/15/2023    1:34 PM  CBC  WBC 4.0 - 10.5 K/uL 8.7  9.0    Hemoglobin 12.0 - 15.0 g/dL 56.3  87.5  64.3   Hematocrit 36.0 - 46.0 % 34.1  33.4  35.0   Platelets 150 - 400 K/uL 191  201     External Labs:  Review of Systems  Cardiovascular:  Negative for chest pain, dyspnea on exertion and leg swelling.  Neurological:  Positive for dizziness.    Physical Exam:   VS:  BP 138/76 (BP Location: Left Arm, Patient Position: Sitting, Cuff Size: Normal)   Pulse 76   Resp 16   Ht 5\' 5"  (1.651 m)   Wt 129 lb 12.8 oz (58.9 kg)   SpO2 96%   BMI 21.60 kg/m    Wt Readings from Last 3 Encounters:  05/22/23 129 lb 12.8 oz (58.9 kg)  05/17/23 131 lb (59.4 kg)  03/26/23 140 lb 12.8 oz (63.9 kg)     Physical Exam Neck:     Vascular: No carotid bruit or JVD.  Cardiovascular:     Rate and Rhythm: Normal rate and regular rhythm.     Pulses: Intact distal pulses.     Heart sounds: Normal heart sounds. No murmur heard.    No gallop.  Pulmonary:     Effort: Pulmonary effort is normal.     Breath sounds: Normal breath sounds.  Abdominal:     General:  Bowel sounds are normal.     Palpations: Abdomen is soft.  Musculoskeletal:     Right lower leg: No edema.     Left lower leg: No edema.     Studies Reviewed: Marland Kitchen    Chest x-ray two-view 05/14/2023: Low lung volume. Mild diffuse pulmonary vascular congestion with bilateral layering pleural effusions, favoring congestive heart failure/pulmonary edema. There are probable associated compressive atelectatic changes in the bilateral lung bases. Bilateral lung fields are otherwise clear. No pneumothorax.  Echocardiogram 05/05/2023 :   1. Left ventricular ejection fraction, by estimation, is 50 to 55%. The left ventricle has low normal function. The left ventricle demonstrates regional wall motion abnormalities (The entire anterior wall is hypokinetic, otherwise normal). Left ventricular diastolic  parameters are consistent with Grade II diastolic dysfunction (pseudonormalization). Elevated left ventricular end-diastolic pressure.  2. Right ventricular systolic function is normal. The right ventricular size is normal. There is moderately elevated pulmonary artery systolic pressure.  3. Left atrial size was moderately dilated.  4. Right atrial size was severely dilated.  5. The mitral valve is normal in structure. Moderate mitral valve regurgitation. No evidence of mitral stenosis.  6. The aortic valve is tricuspid. Aortic valve regurgitation is mild. No aortic stenosis is present.  7. The inferior vena cava is dilated in size with >50% respiratory variability, suggesting right atrial pressure of 8 mmHg.  Right + Left Heart Catheterization 05/15/2023:  1.  Mild nonobstructive CAD with patency of the left main and left circumflex with no stenoses, mild 30% proximal RCA stenosis, and 40 to 50% LAD stenosis just after the first diagonal 2.  Normal LVEDP of 7 mmHg 3.  Normal wedge pressure of 9 mmHg, mean PA pressure 22 mmHg, transpulmonary gradient 13 mmHg, PVR 2.1 Wood units     EKG:    EKG  Interpretation Date/Time:  Thursday May 22 2023 10:40:07 EST Ventricular Rate:  80 PR Interval:  160 QRS Duration:  78 QT Interval:  408 QTC Calculation: 470 R Axis:   60  Text Interpretation: EKG 05/22/2023: Impression normal sinus rhythm at rate of 80 bpm with frequent PACs in bigeminal pattern.  Low-voltage complexes.  Compared to 05/14/2023, brief atrial tachycardia and frequent PACs has been replaced.  Lateral nonspecific sagging ST segment depressions are no longer present. Confirmed by Delrae Rend [phone]) on 05/22/2023 11:03:30 AM    Medications and  allergies    Allergies  Allergen Reactions   Vibramycin [Doxycycline] Other (See Comments)    Photosensitivity      Current Outpatient Medications:    apixaban (ELIQUIS) 5 MG TABS tablet, Take 2.5 mg by mouth 2 (two) times daily after a meal., Disp: , Rfl:    atorvastatin (LIPITOR) 40 MG tablet, TAKE 1 TABLET DAILY (Patient taking differently: Take 40 mg by mouth at bedtime.), Disp: 90 tablet, Rfl: 3   furosemide (LASIX) 20 MG tablet, Take 1 tablet (20 mg total) by mouth daily as needed. Take if short of breath or if weight increases > 3 Lbs in 3 days, Disp: 30 tablet, Rfl: 0   Multiple Vitamins-Minerals (PRESERVISION AREDS 2) CAPS, Take 1 capsule by mouth 2 (two) times daily., Disp: , Rfl:    OVER THE COUNTER MEDICATION, Take 1-2 tablets by mouth See admin instructions. OTC Bone Strength Supplement. Take 1 tablet in the morning and 2 tablets after supper., Disp: , Rfl:    potassium chloride (KLOR-CON M) 10 MEQ tablet, Take 2 tablets (20 mEq total) by mouth daily as needed (Take with furosemide only)., Disp: 60 tablet, Rfl: 0   Probiotic Product (ALIGN) 4 MG CAPS, Take 4 mg by mouth daily as needed (bloating)., Disp: , Rfl:    Wheat Dextrin (BENEFIBER PO), Take 1 Scoop by mouth daily., Disp: , Rfl:    zaleplon (SONATA) 5 MG capsule, Take 5 mg by mouth at bedtime as needed for sleep., Disp: , Rfl:    amiodarone (PACERONE) 200 MG  tablet, Take 1 tablet (200 mg total) by mouth daily., Disp: 90 tablet, Rfl: 1   midodrine (PROAMATINE) 5 MG tablet, Take 1 tablet (5 mg total) by mouth 3 (three) times daily with meals. During awake hours only. Can take extra if dizziness and BP < 100 mm Hg, Disp: 270 tablet, Rfl: 1   ASSESSMENT AND PLAN: .      ICD-10-CM   1. Acute on chronic diastolic (congestive) heart failure (HCC)  I50.33 EKG 12-Lead    2. Paroxysmal atrial fibrillation (HCC)  I48.0 amiodarone (PACERONE) 200 MG tablet    3. Orthostatic hypotension  I95.1 midodrine (PROAMATINE) 5 MG tablet    4. Stage 3a chronic kidney disease (HCC)  N18.31      Assessment and Plan    Heart Failure Patient is well compensated currently, but has a history of fluid accumulation leading to hospitalization. She reports improved breathing and no leg swelling. She is currently on amiodarone, Eliquis, and as-needed furosemide. She has been advised to monitor her weight daily and take an extra dose of furosemide if she gains 2-3 pounds in 2-3 days.  She is not on guideline directed medical therapy as she has difficulty with orthostatic hypotension as well -Continue current medications:amiodarone, Eliquis, and as-needed furosemide. -Monitor weight daily and adjust furosemide dosage as needed. -Return for follow-up in 6 months, or sooner if issues arise.  Orthostatic Hypotension Patient reports feeling lightheaded, likely due to low blood pressure. She is currently on midodrine, which she takes when active. She has been advised to keep a pill by her bed and take it before getting up if she feels symptomatic in the morning. -Continue midodrine as needed, adjusting dosage based on symptoms and blood pressure readings. -Consider taking midodrine before getting up in the morning if feeling symptomatic.  Atrial Fibrillation Patient is on amiodarone and Eliquis for management of atrial fibrillation. She is not currently experiencing extra  heartbeats. -Continue amiodarone and Eliquis. -  Reduce Eliquis dosage to 2.5mg  due to her age and her weight.  General Health Maintenance -Encouraged to gradually increase activity as tolerated. -Advised to be cautious with salt intake, particularly when eating out, to avoid inducing heart failure. -Continue to monitor blood pressure regularly.      Daughter is present and all questions answered.  She has a follow-up visit with her PCP, she will need follow-up BMP.  Signed,  Yates Decamp, MD, [facility] 05/22/2023, 4:52 PM [facility] [address] [place], [place] [address] Phone: [phone]. Fax:  [phone]

## 2023-05-22 NOTE — Patient Instructions (Signed)
 Medication Instructions:  Your physician recommends that you continue on your current medications as directed. Please refer to the Current Medication list given to you today.  *If you need a refill on your cardiac medications before your next appointment, please call your pharmacy*   Lab Work: none If you have labs (blood work) drawn today and your tests are completely normal, you will receive your results only by: MyChart Message (if you have MyChart) OR A paper copy in the mail If you have any lab test that is abnormal or we need to change your treatment, we will call you to review the results.   Testing/Procedures: none   Follow-Up: At [facility], you and your health needs are our priority.  As part of our continuing mission to provide you with exceptional heart care, we have created designated Provider Care Teams.  These Care Teams include your primary Cardiologist (physician) and Advanced Practice Providers (APPs -  Physician Assistants and Nurse Practitioners) who all work together to provide you with the care you need, when you need it.  We recommend signing up for the patient portal called "MyChart".  Sign up information is provided on this After Visit Summary.  MyChart is used to connect with patients for Virtual Visits (Telemedicine).  Patients are able to view lab/test results, encounter notes, upcoming appointments, etc.  Non-urgent messages can be sent to your provider as well.   To learn more about what you can do with MyChart, go to [email or web address].    Your next appointment:   6 month(s)  Provider:   Yates Decamp, MD     Other Instructions

## 2023-05-27 ENCOUNTER — Telehealth: Payer: Self-pay | Admitting: Cardiology

## 2023-05-27 DIAGNOSIS — I480 Paroxysmal atrial fibrillation: Secondary | ICD-10-CM

## 2023-05-27 DIAGNOSIS — I951 Orthostatic hypotension: Secondary | ICD-10-CM

## 2023-05-27 MED ORDER — ATORVASTATIN CALCIUM 40 MG PO TABS: 40.0000 mg | ORAL_TABLET | Freq: Every day | ORAL | 3 refills | Status: AC

## 2023-05-27 MED ORDER — AMIODARONE HCL 200 MG PO TABS: 200.0000 mg | ORAL_TABLET | Freq: Every day | ORAL | 3 refills | Status: DC

## 2023-05-27 MED ORDER — MIDODRINE HCL 5 MG PO TABS: 5.0000 mg | ORAL_TABLET | Freq: Three times a day (TID) | ORAL | 3 refills | Status: DC

## 2023-05-27 NOTE — Telephone Encounter (Signed)
*  STAT* If patient is at the pharmacy, call can be transferred to refill team.   1. Which medications need to be refilled? (please list name of each medication and dose if known) amiodarone (PACERONE) 200 MG tablet       atorvastatin (LIPITOR) 40 MG tablet    midodrine (PROAMATINE) 5 MG tablet  2. Which pharmacy/location (including street and city if local pharmacy) is medication to be sent to? [facility] - [place], [place] - [address]  3. Do they need a 30 day or 90 day supply?  90 day supply  Patient would like to have her prescriptions transferred to [facility].

## 2023-06-01 ENCOUNTER — Encounter: Payer: Self-pay | Admitting: Cardiology

## 2023-06-11 ENCOUNTER — Ambulatory Visit: Payer: Medicare HMO | Admitting: Podiatry

## 2023-06-11 ENCOUNTER — Encounter: Payer: Self-pay | Admitting: Podiatry

## 2023-06-11 DIAGNOSIS — Q828 Other specified congenital malformations of skin: Secondary | ICD-10-CM | POA: Diagnosis not present

## 2023-06-11 DIAGNOSIS — Z89429 Acquired absence of other toe(s), unspecified side: Secondary | ICD-10-CM

## 2023-06-11 DIAGNOSIS — B351 Tinea unguium: Secondary | ICD-10-CM | POA: Diagnosis not present

## 2023-06-11 DIAGNOSIS — M79674 Pain in right toe(s): Secondary | ICD-10-CM

## 2023-06-11 DIAGNOSIS — M79675 Pain in left toe(s): Secondary | ICD-10-CM

## 2023-06-19 NOTE — Progress Notes (Signed)
  Subjective:  Patient ID: Kimberly Villegas, female    DOB: Oct 20, 1934,  MRN: [id number]  89 y.o. female presents at risk foot care. 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 Painful toenails interfere with ambulation. Aggravating factors include wearing enclosed shoe gear. Pain is relieved with periodic professional debridement. Painful porokeratotic lesions are aggravated when weightbearing with and without shoegear. Pain is relieved with periodic professional debridement. Patient was hospitalized in January for CHF. States she is feeling better now. Chief Complaint  Patient presents with   RFC    She is here for a nail trim, PCP is Dr. Janey.    New problem(s): None   PCP is Avva, Ravisankar, MD.  Allergies  Allergen Reactions   Vibramycin [Doxycycline] Other (See Comments)    Photosensitivity     Review of Systems: Negative except as noted in the HPI.   Objective:  Kimberly Villegas is a pleasant 89 y.o. female WD, WN in NAD. AAO x 3.  Vascular Examination: Vascular status intact b/l with palpable pedal pulses. CFT immediate b/l. Pedal hair present. No edema. No pain with calf compression b/l. Skin temperature gradient WNL b/l. No varicosities noted. No cyanosis or clubbing noted.  Neurological Examination: Sensation grossly intact b/l with 10 gram monofilament. Vibratory sensation intact b/l.  Dermatological Examination: Pedal skin with normal turgor, texture and tone b/l. No open wounds nor interdigital macerations noted. Toenails bilateral great toes and 3-5 b/l thick, discolored, elongated with subungual debris and pain on dorsal palpation. Porokeratotic lesion(s) submet head 4 left foot. No erythema, no edema, no drainage, no fluctuance.   Musculoskeletal Examination: Muscle strength 5/5 to b/l LE.  No pain, crepitus noted b/l. No gross pedal  deformities. Patient ambulates independently without assistive aids.   Radiographs: None  Last A1c:       No data to display           Assessment:   1. Pain due to onychomycosis of toenails of both feet   2. Porokeratosis   3. Status post amputation of lesser toe, unspecified laterality (HCC)    Plan:  Patient was evaluated and treated. All patient's and/or POA's questions/concerns addressed on today's visit. Toenails bilateral great toes and 3-5 b/l debrided in length and girth without incident. Porokeratotic lesion(s) submet head 4 left foot pared with sharp debridement without incident. Continue soft, supportive shoe gear daily. Report any pedal injuries to medical professional. Call office if there are any questions/concerns. -Patient/POA to call should there be question/concern in the interim.  Return in about 3 months (around 09/08/2023).  Delon LITTIE Merlin, DPM      [place] LOCATION: [address] [place] [address]                   Office [phone]   [facility] LOCATION: [address] [place], [place] [address] Office [phone]

## 2023-08-12 ENCOUNTER — Encounter: Payer: Self-pay | Admitting: Podiatry

## 2023-08-12 ENCOUNTER — Ambulatory Visit: Payer: Medicare HMO | Admitting: Podiatry

## 2023-08-12 DIAGNOSIS — B351 Tinea unguium: Secondary | ICD-10-CM | POA: Diagnosis not present

## 2023-08-12 DIAGNOSIS — M79675 Pain in left toe(s): Secondary | ICD-10-CM

## 2023-08-12 DIAGNOSIS — Z89429 Acquired absence of other toe(s), unspecified side: Secondary | ICD-10-CM

## 2023-08-12 DIAGNOSIS — M79674 Pain in right toe(s): Secondary | ICD-10-CM

## 2023-08-17 NOTE — Progress Notes (Signed)
  Subjective:  Patient ID: Kimberly Villegas, female    DOB: 1934-10-15,  MRN: [id number]  MEGGEN SPAZIANI presents to clinic today for at risk foot care. 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 Pain interferes with daily activities and wearing enclosed shoe gear comfortably.  Chief Complaint  Patient presents with   RFC    She is here for nail trim, PCP is Dr Thor Fling and seen every 6 months,    New problem(s): None.   PCP is Avva, Ravisankar, MD.  Allergies  Allergen Reactions   Vibramycin [Doxycycline] Other (See Comments)    Photosensitivity    Review of Systems: Negative except as noted in the HPI.  Objective: No changes noted in today's physical examination. There were no vitals filed for this visit. Kimberly Villegas is a pleasant 89 y.o. female WD, WN in NAD. AAO x 3.  Vascular Examination: Vascular status intact b/l with palpable pedal pulses. CFT immediate b/l. Pedal hair present. No edema. No pain with calf compression b/l. Skin temperature gradient WNL b/l. No varicosities noted. No cyanosis or clubbing noted.  Neurological Examination: Sensation grossly intact b/l with 10 gram monofilament. Vibratory sensation intact b/l.  Dermatological Examination: Pedal skin with normal turgor, texture and tone b/l. No open wounds nor interdigital macerations noted. Toenails bilateral great toes and 3-5 b/l thick, discolored, elongated with subungual debris and pain on dorsal palpation.   Musculoskeletal Examination: Muscle strength 5/5 to b/l LE.  No pain, crepitus noted b/l. No gross pedal deformities. Patient ambulates independently without assistive aids.   Radiographs: None  Assessment/Plan: 1. Pain due to onychomycosis of toenails of both feet   2. Status post amputation of lesser toe, unspecified laterality [facility])   Consent given for treatment. Patient examined. All patient's  and/or POA's questions/concerns addressed on today's visit. Mycotic toenails bilateral great toes and 3-5 b/l debrided in length and girth without incident. Continue foot and shoe inspections daily. Monitor blood glucose per PCP/Endocrinologist's recommendations.Continue soft, supportive shoe gear daily. Report any pedal injuries to medical professional. Call office if there are any quesitons/concerns. -Patient/POA to call should there be question/concern in the interim.   Return in about 9 weeks (around 10/14/2023).  Kimberly Villegas, DPM      [place] LOCATION: [address], [place] [address]                   Office [phone]   [facility] LOCATION: [address] [place], [place] [address] Office [phone]

## 2023-10-14 ENCOUNTER — Encounter: Payer: Self-pay | Admitting: Podiatry

## 2023-10-14 ENCOUNTER — Ambulatory Visit: Payer: Medicare HMO | Admitting: Podiatry

## 2023-10-14 DIAGNOSIS — M79675 Pain in left toe(s): Secondary | ICD-10-CM | POA: Diagnosis not present

## 2023-10-14 DIAGNOSIS — Z89429 Acquired absence of other toe(s), unspecified side: Secondary | ICD-10-CM | POA: Diagnosis not present

## 2023-10-14 DIAGNOSIS — B351 Tinea unguium: Secondary | ICD-10-CM | POA: Diagnosis not present

## 2023-10-14 DIAGNOSIS — M79674 Pain in right toe(s): Secondary | ICD-10-CM

## 2023-10-19 NOTE — Progress Notes (Signed)
  Subjective:  Patient ID: Kimberly Villegas, female    DOB: [birth year],  MRN: [id number]  Kimberly Villegas presents to clinic today for at risk foot care. Patient has h/o amputation of digital amputation L 2nd toe and R 2nd toe and painful thick toenails that are difficult to trim. Pain interferes with ambulation. Aggravating factors include wearing enclosed shoe gear. Pain is relieved with periodic professional debridement.  Chief Complaint  Patient presents with   Nail Problem    My toenails have to be cut.   New problem(s): None.   PCP is Avva, Ravisankar, MD.  Allergies  Allergen Reactions   Vibramycin [Doxycycline] Other (See Comments)    Photosensitivity     Review of Systems: Negative except as noted in the HPI.  Objective: No changes noted in today's physical examination. There were no vitals filed for this visit. [name] is a pleasant 89 y.o. female WD, WN in NAD. AAO x 3.  Vascular Examination: Capillary refill time immediate b/l. Palpable pedal pulses. Pedal hair present b/l. No pain with calf compression b/l. Skin temperature gradient WNL b/l. No cyanosis or clubbing b/l. No ischemia or gangrene noted b/l. No edema noted b/l LE.  Neurological Examination: Sensation grossly intact b/l with 10 gram monofilament. Vibratory sensation intact b/l.   Dermatological Examination: Pedal skin with normal turgor, texture and tone b/l.  No open wounds. No interdigital macerations.   Toenails b/l halluces and 3-5 b/l thick, discolored, elongated with subungual debris and pain on dorsal palpation.   No corns, calluses nor porokeratotic lesions noted.  Musculoskeletal Examination: Normal muscle strength 5/5 to all lower extremity muscle groups bilaterally. Lower extremity amputation(s): digital amputation bilateral 2nd toes.. No pain, crepitus or joint limitation noted with ROM b/l LE.  Patient ambulates independently without assistive aids.  Radiographs:  None  Assessment/Plan: 1. Pain due to onychomycosis of toenails of both feet   2. Status post amputation of lesser toe, unspecified laterality (HCC)    -Consent given for treatment as described below: -Examined patient. -Continue supportive shoe gear daily. -Mycotic toenails bilateral great toes, 3-5 left foot, and 3-5 right foot were debrided in length and girth with sterile nail nippers and dremel without iatrogenic bleeding. -Patient/POA to call should there be question/concern in the interim.   Return in about 3 months (around 01/14/2024).  [name], DPM      [place] LOCATION: [address], [place] [address]                   Office [phone]   [facility] LOCATION: [address] [place], [place] [address] Office [phone]

## 2023-11-13 ENCOUNTER — Telehealth: Payer: Self-pay | Admitting: Cardiology

## 2023-11-13 NOTE — Telephone Encounter (Signed)
 STAT if patient feels like he/she is going to faint   1. Are you feeling dizzy, lightheaded, or faint right now? Lightheaded, not at this time    2. Have you passed out?   (If yes move to .[facility]) have not passed out, but have fallen 4 times mostly after dinner   3. Do you have any other symptoms? no   4. Have you checked your HR and BP (record if available)? Last night it was 150- she did not remember the bottom number- patient has an appointment on 11-21-23- she say she would like to be seen sooner

## 2023-11-13 NOTE — Telephone Encounter (Signed)
 Patient reports for the past 2 weeks she has been lightheaded at least once a week, and this has caused her to fall 4 times. This mostly occurs after dinner.   She denies passing out, denies SOB or palpitations.   Patient states she felt warm after falling at dinner last night and her SBP was 150. She does not remember her DBP or HR. She also takes midodrine  5 mg TID for orthostatic hypotension. She had taken midodrine  2 hours prior to her fall last night.  Patient states she does not drink much water and was advised by her daughter to wait a few minutes when standing up from the dinner table before walking back to her room. Encouraged patient to increase water intake some and change positions slowly, waiting a few minutes before walking. Patient verbalized understanding.  Patient would like to see Dr. Ladona sooner than her already scheduled appt on 11/21/23. No appts available, but will forward to Dr. Ladona and his nurse to review.

## 2023-11-14 NOTE — Telephone Encounter (Signed)
 I spoke with patient and gave her message from Dr Berry Bristol

## 2023-11-14 NOTE — Telephone Encounter (Signed)
 Please let patient know post prandial (after food) syncope is due to increased blood flow to the intestine and she should either get up immediately after dinner and sit on the sofa or some of the place or sit at the dining table for at least 20 to 30 minutes before getting up.  Probably better to use support stockings as well especially after heavy meal or a hot meal.

## 2023-11-21 ENCOUNTER — Ambulatory Visit: Attending: Cardiology | Admitting: Cardiology

## 2023-11-21 ENCOUNTER — Encounter: Payer: Self-pay | Admitting: Cardiology

## 2023-11-21 VITALS — BP 140/56 | HR 66 | Ht 65.0 in | Wt 134.2 lb

## 2023-11-21 DIAGNOSIS — I5032 Chronic diastolic (congestive) heart failure: Secondary | ICD-10-CM

## 2023-11-21 DIAGNOSIS — I480 Paroxysmal atrial fibrillation: Secondary | ICD-10-CM

## 2023-11-21 DIAGNOSIS — I951 Orthostatic hypotension: Secondary | ICD-10-CM

## 2023-11-21 NOTE — Patient Instructions (Signed)
 Medication Instructions:  Decrease amiodarone  to half tablet by mouth daily  *If you need a refill on your cardiac medications before your next appointment, please call your pharmacy*  Lab Work: none If you have labs (blood work) drawn today and your tests are completely normal, you will receive your results only by: MyChart Message (if you have MyChart) OR A paper copy in the mail If you have any lab test that is abnormal or we need to change your treatment, we will call you to review the results.  Testing/Procedures: none  Follow-Up: At [facility], you and your health needs are our priority.  As part of our continuing mission to provide you with exceptional heart care, our providers are all part of one team.  This team includes your primary Cardiologist (physician) and Advanced Practice Providers or APPs (Physician Assistants and Nurse Practitioners) who all work together to provide you with the care you need, when you need it.  Your next appointment:   6 month(s)  Provider:  Dr Ladona    We recommend signing up for the patient portal called MyChart.  Sign up information is provided on this After Visit Summary.  MyChart is used to connect with patients for Virtual Visits (Telemedicine).  Patients are able to view lab/test results, encounter notes, upcoming appointments, etc.  Non-urgent messages can be sent to your provider as well.   To learn more about what you can do with MyChart, go to [email or web address].   Other Instructions

## 2023-11-21 NOTE — Progress Notes (Signed)
 Cardiology Office Note:  .   Date:  11/21/2023  ID:  Kimberly Villegas, DOB 11/12/1934, MRN [id number] PCP: Janey Santos, MD  [facility] Providers Cardiologist:  Gordy Bergamo, MD Electrophysiologist:  OLE ONEIDA HOLTS, MD   History of Present Illness: .   Kimberly Villegas is a 89 y.o. female with paroxysmal atrial fibrillation, mild to moderate aortic and mitral regurgitation and very mild asymptomatic left subclavian artery stenosis, carotid atherosclerosis, hyperlipidemia, previously hypertensive but since summer 2024, blood pressure has been soft and has orthostatic hypotension.  Last hospitalization due to acute decompensated heart failure was in January 2025.  Presently on amiodarone  for maintaining sinus rhythm in view of heart failure Multaq  was discontinued.  Left and right heart catheterization on 05/15/2023 revealing no significant coronary artery disease and normal right heart pressures.  This is a 13-month office visit, presently except for episodes of orthostatic symptoms of dizziness and occasional fall or near fall, remains asymptomatic. No dyspnea, no fatigue or palpitations.   Discussed the use of AI scribe software for clinical note transcription with the patient, who gave verbal consent to proceed.  History of Present Illness Kimberly Villegas is an 89 year old female with orthostatic hypotension who presents with frequent falls and dizziness. She is accompanied by her daughter, Jamee.  She experiences dizziness and a sensation of near syncope, particularly after meals and when standing after prolonged sitting, resulting in four falls in the past two weeks. Her medication regimen includes Midodrine , recently increased to 5 mg, taken four to five times daily, and amiodarone  for atrial fibrillation. Falls have not occurred in her apartment, where she keeps her feet elevated, but in other locations after prolonged sitting. She uses support stockings, which are not very  tight, and wears full-length stockings when anticipating extended periods on her feet. There is concern about her nutritional intake, as she sometimes skips meals, although she had a good meal before one episode of dizziness.   Labs   Lab Results  Component Value Date   CHOL 125 05/15/2023   HDL 66 05/15/2023   LDLCALC 50 05/15/2023   TRIG 44 05/15/2023   CHOLHDL 1.9 05/15/2023   No results found for: LIPOA  Lab Results  Component Value Date   NA 135 05/17/2023   K 4.1 05/17/2023   CO2 23 05/17/2023   GLUCOSE 94 05/17/2023   BUN 16 05/17/2023   CREATININE 1.15 (H) 05/17/2023   CALCIUM  8.7 (L) 05/17/2023   EGFR 32 (L) 08/29/2021   GFRNONAA 46 (L) 05/17/2023      Latest Ref Rng & Units 05/17/2023    2:29 AM 05/16/2023    2:49 AM 05/15/2023    1:34 PM  BMP  Glucose 70 - 99 mg/dL 94  861    BUN 8 - 23 mg/dL 16  20    Creatinine 9.55 - 1.00 mg/dL 8.84  8.33    Sodium 864 - 145 mmol/L 135  137  139   Potassium 3.5 - 5.1 mmol/L 4.1  3.7  3.3   Chloride 98 - 111 mmol/L 104  99    CO2 22 - 32 mmol/L 23  27    Calcium  8.9 - 10.3 mg/dL 8.7  8.6        Latest Ref Rng & Units 05/17/2023    2:29 AM 05/16/2023    2:49 AM 05/15/2023    1:34 PM  CBC  WBC 4.0 - 10.5 K/uL 8.7  9.0    Hemoglobin  12.0 - 15.0 g/dL 88.6  88.7  88.0   Hematocrit 36.0 - 46.0 % 34.1  33.4  35.0   Platelets 150 - 400 K/uL 191  201      ROS  Review of Systems  Cardiovascular:  Positive for near-syncope. Negative for chest pain, dyspnea on exertion and leg swelling.  Neurological:  Positive for dizziness and light-headedness.   Physical Exam:   VS:  BP (!) 140/56 (BP Location: Left Arm, Patient Position: Sitting)   Pulse 66   Ht 5' 5 (1.651 m)   Wt 134 lb 3.2 oz (60.9 kg)   SpO2 96%   BMI 22.33 kg/m    Wt Readings from Last 3 Encounters:  11/21/23 134 lb 3.2 oz (60.9 kg)  05/22/23 129 lb 12.8 oz (58.9 kg)  05/17/23 131 lb (59.4 kg)    Physical Exam Neck:     Vascular: No carotid bruit or JVD.   Cardiovascular:     Rate and Rhythm: Normal rate and regular rhythm.     Pulses: Intact distal pulses.     Heart sounds: Normal heart sounds. No murmur heard.    No gallop.  Pulmonary:     Effort: Pulmonary effort is normal.     Breath sounds: Normal breath sounds.  Abdominal:     General: Bowel sounds are normal.     Palpations: Abdomen is soft.  Musculoskeletal:     Right lower leg: No edema.     Left lower leg: No edema.    Studies Reviewed: SABRA     EKG:    EKG Interpretation Date/Time:  Friday November 21 2023 14:04:12 EDT Ventricular Rate:  66 PR Interval:  170 QRS Duration:  92 QT Interval:  462 QTC Calculation: 484 R Axis:   79  Text Interpretation: EKG 11/21/2023: Normal sinus rhythm at a rate of 66 bpm, normal EKG.  Normal QT interval.  Compared to 05/22/2023, no change. Confirmed by Dessie Delcarlo, Jagadeesh ([id number]) on 11/21/2023 2:17:30 PM    Medications ordered    No orders of the defined types were placed in this encounter.    ASSESSMENT AND PLAN: .      ICD-10-CM   1. Paroxysmal atrial fibrillation (HCC)  I48.0 EKG 12-Lead    2. Orthostatic hypotension  I95.1 EKG 12-Lead    3. Chronic diastolic congestive heart failure (HCC)  I50.32 EKG 12-Lead     Assessment & Plan Orthostatic hypotension Orthostatic hypotension with postprandial syncope and frequent falls. Blood pressure is elevated when supine and drops significantly upon standing. Recent increase in falls, particularly after sitting for extended periods and then standing. Symptoms exacerbated by hot showers and hot, humid weather. Current management includes Midodrine  and support stockings. Midodrine  dose recently increased to 5 mg, four to five times daily. Amiodarone  may worsen orthostatic hypotension. - Continue Midodrine  5 mg four to five times daily. - Advise wearing full-length support stockings when going out or standing for long periods. - Instruct to perform foot exercises before standing up after  sitting for extended periods. - Advise sitting down if feeling dizzy to prevent falls. - Recommend avoiding hot showers and sitting after meals, especially in hot weather.  Paroxysmal atrial fibrillation Paroxysmal atrial fibrillation managed with amiodarone . She is maintaining sinus rhythm. Amiodarone  dose reduction considered due to its potential to exacerbate orthostatic hypotension. - Reduce amiodarone  dose to 100 mg (half tablet) once daily. - Monitor for stabilization of orthostatic hypotension symptoms over the next four to six weeks.  Chronic diastolic  heart failure Patient remains stable since maintaining sinus rhythm and has not had any hospitalizations. - Use furosemide  as needed for leg edema worsening dyspnea.  Not on beta-blockers or ACE inhibitors due to severe orthostatic hypotension.  Also heart failure precipitated by A-fib, presently tolerating amiodarone  and maintaining sinus rhythm.  Office visit in 6 months.  Signed,  Gordy Bergamo, MD, [facility] 11/21/2023, 2:17 PM [facility] [address] [place], [place] [address] Phone: [phone]. Fax:  [phone]

## 2023-12-16 ENCOUNTER — Encounter: Payer: Self-pay | Admitting: Podiatry

## 2023-12-16 ENCOUNTER — Ambulatory Visit: Admitting: Podiatry

## 2023-12-16 DIAGNOSIS — B351 Tinea unguium: Secondary | ICD-10-CM | POA: Diagnosis not present

## 2023-12-16 DIAGNOSIS — M79675 Pain in left toe(s): Secondary | ICD-10-CM

## 2023-12-16 DIAGNOSIS — L03031 Cellulitis of right toe: Secondary | ICD-10-CM

## 2023-12-16 DIAGNOSIS — M79674 Pain in right toe(s): Secondary | ICD-10-CM

## 2023-12-16 DIAGNOSIS — L600 Ingrowing nail: Secondary | ICD-10-CM | POA: Diagnosis not present

## 2023-12-16 DIAGNOSIS — L02611 Cutaneous abscess of right foot: Secondary | ICD-10-CM | POA: Diagnosis not present

## 2023-12-16 MED ORDER — CEPHALEXIN 500 MG PO CAPS: 500.0000 mg | ORAL_CAPSULE | Freq: Three times a day (TID) | ORAL | 0 refills | Status: AC

## 2023-12-21 NOTE — Progress Notes (Signed)
 Subjective:  Patient ID: Kimberly Villegas, female    DOB: June 08, 1934,  MRN: [id number]  Kimberly Villegas presents to clinic today for at risk foot care. 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 She relates discomfort of right 3rd digit lateral border. Denies any drainage, but digit is tender to touch and distal tip is red. She has not attempted any treatment. Chief Complaint  Patient presents with   RFC    Rm17 Routine foot Care/ Dr. Janey     New problem(s): None.   PCP is Avva, Ravisankar, MD.  Allergies  Allergen Reactions   Vibramycin [Doxycycline] Other (See Comments)    Photosensitivity     Review of Systems: Negative except as noted in the HPI.  Objective: No changes noted in today's physical examination. There were no vitals filed for this visit. Kimberly Villegas is a pleasant 89 y.o. female WD, WN in NAD. AAO x 3.  Vascular Examination: Capillary refill time immediate b/l. Vascular status intact b/l with palpable pedal pulses. Pedal hair present b/l. No pain with calf compression b/l. Skin temperature gradient WNL b/l. No cyanosis or clubbing b/l. No ischemia or gangrene noted b/l. No edema noted b/l LE.  Neurological Examination: Sensation grossly intact b/l with 10 gram monofilament.   Dermatological Examination: Pedal skin with normal turgor, texture and tone b/l.  No open wounds. No interdigital macerations.   Toenails 1 b/l, left 3rd and 4-5 b/l thick, discolored, elongated with subungual debris and pain on dorsal palpation.   Incurvated nailplate lateral border of L 3rd toe.  Nail border hypertrophy present. There is tenderness to palpation. Sign(s) of infection: erythema and pain on palpation.  Musculoskeletal Examination: Muscle strength 5/5 to all lower extremity muscle groups bilaterally. Lower extremity amputation(s): digital amputation  bilateral 2nd toes.  Radiographs: None  Assessment/Plan: 1. Pain due to onychomycosis of toenails of both feet   2. Ingrown toenail of right foot   3. Cellulitis and abscess of toe of right foot     Meds ordered this encounter  Medications   cephALEXin  (KEFLEX ) 500 MG capsule    Sig: Take 1 capsule (500 mg total) by mouth 3 (three) times daily.    Dispense:  21 capsule    Refill:  0  -Patient was evaluated today. All questions/concerns addressed on today's visit. -Patient to continue soft, supportive shoe gear daily. -No invasive procedure(s) performed. Offending nail border debrided and curretaged lateral border of R 3rd toe utilizing sterile nail nipper and currette. Border(s) cleansed with alcohol  and triple antibiotic ointment applied. Patient/POA/Caregiver/Facility instructed to apply Neosporin Cream  to R 3rd toe once daily for 7 days. Call office if there are any concerns. --Rx for Keflex  500 mg po, #21, to be taken one capsule three times daily for 7 days. -Patient/POA to call should there be question/concern in the interim.   Return in about 9 weeks (around 02/17/2024).  Delon LITTIE Merlin, DPM      [place] LOCATION: [address], [place] [address]  Office [phone]   [facility] LOCATION: [address] [place], [place] [address] Office [phone]

## 2023-12-22 ENCOUNTER — Other Ambulatory Visit: Payer: Self-pay | Admitting: Internal Medicine

## 2023-12-22 DIAGNOSIS — Z1231 Encounter for screening mammogram for malignant neoplasm of breast: Secondary | ICD-10-CM

## 2024-01-26 ENCOUNTER — Ambulatory Visit
Admission: RE | Admit: 2024-01-26 | Discharge: 2024-01-26 | Disposition: A | Discharge status: Home / Self Care | Source: Ambulatory Visit | Attending: Internal Medicine | Admitting: Internal Medicine

## 2024-01-26 DIAGNOSIS — Z1231 Encounter for screening mammogram for malignant neoplasm of breast: Secondary | ICD-10-CM

## 2024-02-17 ENCOUNTER — Ambulatory Visit: Admitting: Podiatry

## 2024-02-17 ENCOUNTER — Encounter: Payer: Self-pay | Admitting: Podiatry

## 2024-02-17 DIAGNOSIS — Z89429 Acquired absence of other toe(s), unspecified side: Secondary | ICD-10-CM

## 2024-02-17 DIAGNOSIS — M79674 Pain in right toe(s): Secondary | ICD-10-CM | POA: Diagnosis not present

## 2024-02-17 DIAGNOSIS — M79675 Pain in left toe(s): Secondary | ICD-10-CM | POA: Diagnosis not present

## 2024-02-17 DIAGNOSIS — B351 Tinea unguium: Secondary | ICD-10-CM

## 2024-02-22 ENCOUNTER — Encounter: Payer: Self-pay | Admitting: Podiatry

## 2024-02-22 NOTE — Progress Notes (Signed)
  Subjective:  Patient ID: Kimberly Villegas, female    DOB: 1934-10-04,  MRN: [id number]  JOLYSSA OPLINGER presents to clinic today for at risk foot care. 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 Pain interferes with daily activities and wearing enclosed shoe gear comfortably.  Chief Complaint  Patient presents with   Nail Problem    Cutting my toenails.   New problem(s): None.   PCP is Avva, Ravisankar, MD.  Allergies  Allergen Reactions   Vibramycin [Doxycycline] Other (See Comments)    Photosensitivity     Review of Systems: Negative except as noted in the HPI.  Objective:  There were no vitals filed for this visit. Kimberly Villegas is a pleasant 89 y.o. female WD, WN in NAD. AAO x 3.  Vascular Examination: Capillary refill time immediate b/l. Vascular status intact b/l with palpable pedal pulses. Pedal hair present b/l. No pain with calf compression b/l. Skin temperature gradient WNL b/l. No cyanosis or clubbing b/l. No ischemia or gangrene noted b/l. No edema noted b/l LE.  Neurological Examination: Sensation grossly intact b/l with 10 gram monofilament.   Dermatological Examination: Pedal skin with normal turgor, texture and tone b/l.  No open wounds. No interdigital macerations.   Toenails 1 b/l, b/l 3rd and 4-5 b/l thick, discolored, elongated with subungual debris and pain on dorsal palpation.   Musculoskeletal Examination: Muscle strength 5/5 to all lower extremity muscle groups bilaterally. Lower extremity amputation(s): digital amputation bilateral 2nd toes.  Radiographs: None  Assessment/Plan: 1. Pain due to onychomycosis of toenails of both feet   2. Status post amputation of lesser toe, unspecified laterality   Consent given for treatment. Patient examined. All patient's and/or POA's questions/concerns addressed on today's visit.Toenails bilateral great toes  and 3-5 bilaterally  debrided in length and girth without incident.Continue soft, supportive shoe gear daily. Report any pedal injuries to medical professional. Call office if there are any questions/concerns. Return in about 3 months (around 05/19/2024).  Delon LITTIE Merlin, DPM      [place] LOCATION: [address] [place] [address]                   Office [phone]   [facility] LOCATION: [address] [place], [place] [address] Office [phone]

## 2024-04-15 ENCOUNTER — Encounter: Payer: Self-pay | Admitting: Cardiology

## 2024-04-20 ENCOUNTER — Encounter: Payer: Self-pay | Admitting: Podiatry

## 2024-04-20 ENCOUNTER — Ambulatory Visit: Admitting: Podiatry

## 2024-04-20 DIAGNOSIS — M79674 Pain in right toe(s): Secondary | ICD-10-CM

## 2024-04-20 DIAGNOSIS — B351 Tinea unguium: Secondary | ICD-10-CM

## 2024-04-20 DIAGNOSIS — Z89429 Acquired absence of other toe(s), unspecified side: Secondary | ICD-10-CM | POA: Diagnosis not present

## 2024-04-20 DIAGNOSIS — M79675 Pain in left toe(s): Secondary | ICD-10-CM | POA: Diagnosis not present

## 2024-04-25 NOTE — Progress Notes (Signed)
"  °  Subjective:  Patient ID: Kimberly Villegas, female    DOB: 02/25/1935,  MRN: [id number]  Kimberly Villegas presents to clinic today for at risk foot care. Patient has h/o amputation of digital amputation L 2nd toe and R 2nd toe and painful mycotic toenails x 10 which interfere with daily activities. Pain is relieved with periodic professional debridement.  Chief Complaint  Patient presents with   Nail Problem    Cut the toenails.   New problem(s): None.   PCP is Avva, Ravisankar, MD.  Allergies[1]  Review of Systems: Negative except as noted in the HPI.  Objective: No changes noted in today's physical examination. There were no vitals filed for this visit. Kimberly Villegas is a pleasant 89 y.o. female WD, WN in NAD. AAO x 3.  Vascular Examination: Capillary refill time immediate b/l. Vascular status intact b/l with palpable pedal pulses. Pedal hair present b/l. No pain with calf compression b/l. Skin temperature gradient WNL b/l. No cyanosis or clubbing b/l. No ischemia or gangrene noted b/l. No edema noted b/l LE.  Neurological Examination: Sensation grossly intact b/l with 10 gram monofilament.   Dermatological Examination: Pedal skin with normal turgor, texture and tone b/l.  No open wounds. No interdigital macerations.   Toenails 1 b/l, b/l 3rd and 4-5 b/l thick, discolored, elongated with subungual debris and pain on dorsal palpation.   Musculoskeletal Examination: Muscle strength 5/5 to all lower extremity muscle groups bilaterally. Lower extremity amputation(s): digital amputation bilateral 2nd toes.  Radiographs: None  Assessment/Plan: 1. Pain due to onychomycosis of toenails of both feet   2. Status post amputation of lesser toe, unspecified laterality   Patient was evaluated and treated. All patient's and/or POA's questions/concerns addressed on today's visit. Mycotic toenails 3-5 b/l and great toex b/l debrided in length and girth without incident. Continue  soft, supportive shoe gear daily. Report any pedal injuries to medical professional. Call office if there are any quesitons/concerns.  Return in about 3 months (around 07/19/2024).  Delon LITTIE Merlin, DPM      [place] LOCATION: [address], [place] [address]                   Office [phone]   [place] LOCATION: [address] [place], [place] [address] Office [phone]     [1]  Allergies Allergen Reactions   Vibramycin [Doxycycline] Other (See Comments)    Photosensitivity    "

## 2024-04-26 ENCOUNTER — Telehealth: Payer: Self-pay | Admitting: Cardiology

## 2024-04-26 DIAGNOSIS — I951 Orthostatic hypotension: Secondary | ICD-10-CM

## 2024-04-26 MED ORDER — MIDODRINE HCL 5 MG PO TABS: 5.0000 mg | ORAL_TABLET | Freq: Three times a day (TID) | ORAL | 0 refills | Status: AC

## 2024-04-26 NOTE — Telephone Encounter (Signed)
" °*  STAT* If patient is at the pharmacy, call can be transferred to refill team.   1. Which medications need to be refilled? (please list name of each medication and dose if known) midodrine  (PROAMATINE ) 5 MG tablet    2. Would you like to learn more about the convenience, safety, & potential cost savings by using the [facility]? No     3. Are you open to using the [facility] (Type [facility] No   4. Which pharmacy/location (including street and city if local pharmacy) is medication to be sent to?[facility] - [place], [place] - [address]    5. Do they need a 30 day or 90 day supply? 90 day   Pt is low on medication  "

## 2024-04-26 NOTE — Telephone Encounter (Signed)
 Requested Prescriptions   Signed Prescriptions Disp Refills   midodrine  (PROAMATINE ) 5 MG tablet 270 tablet 0    Sig: Take 1 tablet (5 mg total) by mouth 3 (three) times daily with meals. During awake hours only. Can take extra if dizziness and BP < 100 mm Hg    Authorizing Provider: LADONA HEINZ    Ordering User: Haylin Camilli  C

## 2024-05-17 ENCOUNTER — Other Ambulatory Visit: Payer: Self-pay | Admitting: Cardiology

## 2024-05-17 DIAGNOSIS — I480 Paroxysmal atrial fibrillation: Secondary | ICD-10-CM

## 2024-05-24 ENCOUNTER — Other Ambulatory Visit: Payer: Self-pay | Admitting: Cardiology

## 2024-06-22 ENCOUNTER — Ambulatory Visit: Admitting: Podiatry

## 2024-08-24 ENCOUNTER — Ambulatory Visit: Admitting: Podiatry
